# Patient Record
Sex: Female | Born: 1995 | Race: Black or African American | Hispanic: Yes | Marital: Single | Smoking: Former smoker
Health system: Southern US, Community
[De-identification: ages and names within clinical notes are randomized; demographics above are authoritative.]

## PROBLEM LIST (undated history)

## (undated) ENCOUNTER — Inpatient Hospital Stay (HOSPITAL_COMMUNITY): Payer: Self-pay

## (undated) DIAGNOSIS — O2300 Infections of kidney in pregnancy, unspecified trimester: Secondary | ICD-10-CM

## (undated) DIAGNOSIS — R011 Cardiac murmur, unspecified: Secondary | ICD-10-CM

## (undated) DIAGNOSIS — D573 Sickle-cell trait: Secondary | ICD-10-CM

## (undated) DIAGNOSIS — D649 Anemia, unspecified: Secondary | ICD-10-CM

## (undated) DIAGNOSIS — F419 Anxiety disorder, unspecified: Secondary | ICD-10-CM

## (undated) HISTORY — PX: TUBAL LIGATION: SHX77

## (undated) HISTORY — DX: Sickle-cell trait: D57.3

---

## 2015-05-26 ENCOUNTER — Emergency Department (HOSPITAL_COMMUNITY): Payer: Medicaid - Out of State

## 2015-05-26 ENCOUNTER — Emergency Department (HOSPITAL_COMMUNITY)
Admission: EM | Admit: 2015-05-26 | Discharge: 2015-05-27 | Disposition: A | Discharge status: Home / Self Care | Payer: Medicaid - Out of State | Attending: Emergency Medicine | Admitting: Emergency Medicine

## 2015-05-26 DIAGNOSIS — R110 Nausea: Secondary | ICD-10-CM | POA: Insufficient documentation

## 2015-05-26 DIAGNOSIS — R1031 Right lower quadrant pain: Secondary | ICD-10-CM | POA: Diagnosis not present

## 2015-05-26 DIAGNOSIS — R1011 Right upper quadrant pain: Secondary | ICD-10-CM | POA: Diagnosis not present

## 2015-05-26 DIAGNOSIS — R079 Chest pain, unspecified: Secondary | ICD-10-CM | POA: Insufficient documentation

## 2015-05-26 DIAGNOSIS — Z3A01 Less than 8 weeks gestation of pregnancy: Secondary | ICD-10-CM | POA: Insufficient documentation

## 2015-05-26 DIAGNOSIS — O9989 Other specified diseases and conditions complicating pregnancy, childbirth and the puerperium: Secondary | ICD-10-CM | POA: Diagnosis not present

## 2015-05-26 DIAGNOSIS — N898 Other specified noninflammatory disorders of vagina: Secondary | ICD-10-CM | POA: Insufficient documentation

## 2015-05-26 DIAGNOSIS — R1030 Lower abdominal pain, unspecified: Secondary | ICD-10-CM | POA: Diagnosis not present

## 2015-05-26 DIAGNOSIS — Z3490 Encounter for supervision of normal pregnancy, unspecified, unspecified trimester: Secondary | ICD-10-CM

## 2015-05-26 DIAGNOSIS — R1012 Left upper quadrant pain: Secondary | ICD-10-CM | POA: Insufficient documentation

## 2015-05-26 LAB — CBC
HCT: 41.2 % (ref 36.0–46.0)
Hemoglobin: 14.3 g/dL (ref 12.0–15.0)
MCH: 29.6 pg (ref 26.0–34.0)
MCHC: 34.7 g/dL (ref 30.0–36.0)
MCV: 85.3 fL (ref 78.0–100.0)
PLATELETS: 202 10*3/uL (ref 150–400)
RBC: 4.83 MIL/uL (ref 3.87–5.11)
RDW: 14.3 % (ref 11.5–15.5)
WBC: 10.4 10*3/uL (ref 4.0–10.5)

## 2015-05-26 LAB — COMPREHENSIVE METABOLIC PANEL
ALBUMIN: 4.1 g/dL (ref 3.5–5.0)
ALT: 12 U/L — AB (ref 14–54)
AST: 21 U/L (ref 15–41)
Alkaline Phosphatase: 90 U/L (ref 38–126)
Anion gap: 8 (ref 5–15)
CHLORIDE: 105 mmol/L (ref 101–111)
CO2: 24 mmol/L (ref 22–32)
CREATININE: 0.9 mg/dL (ref 0.44–1.00)
Calcium: 9.5 mg/dL (ref 8.9–10.3)
GFR calc Af Amer: >60 mL/min (ref >60)
GFR calc non Af Amer: >60 mL/min (ref >60)
GLUCOSE: 86 mg/dL (ref 65–99)
Potassium: 3.4 mmol/L — ABNORMAL LOW (ref 3.5–5.1)
SODIUM: 137 mmol/L (ref 135–145)
Total Bilirubin: 0.8 mg/dL (ref 0.3–1.2)
Total Protein: 7.2 g/dL (ref 6.5–8.1)

## 2015-05-26 LAB — I-STAT TROPONIN, ED: TROPONIN I, POC: 0 ng/mL (ref 0.00–0.08)

## 2015-05-26 LAB — URINALYSIS, ROUTINE W REFLEX MICROSCOPIC
Bilirubin Urine: NEGATIVE
GLUCOSE, UA: NEGATIVE mg/dL
Hgb urine dipstick: NEGATIVE
KETONES UR: NEGATIVE mg/dL
LEUKOCYTES UA: NEGATIVE
Nitrite: NEGATIVE
PH: 7 (ref 5.0–8.0)
Protein, ur: NEGATIVE mg/dL
Specific Gravity, Urine: 1.013 (ref 1.005–1.030)
Urobilinogen, UA: 1 mg/dL (ref 0.0–1.0)

## 2015-05-26 LAB — I-STAT BETA HCG BLOOD, ED (MC, WL, AP ONLY): I-stat hCG, quantitative: 1457 m[IU]/mL — ABNORMAL HIGH (ref <5)

## 2015-05-26 LAB — LIPASE, BLOOD: LIPASE: 23 U/L (ref 22–51)

## 2015-05-26 NOTE — ED Provider Notes (Signed)
CSN: [id number]     Arrival date & time 05/26/15  1947 History   First MD Initiated Contact with Patient 05/26/15 2313     Chief Complaint  Patient presents with  . Chest Pain  . Abdominal Pain     (Consider location/radiation/quality/duration/timing/severity/associated sxs/prior Treatment) HPI  Shelby Serrano is a 19 yo G2P1 female who presents today with abdominal pain, chest pain that radiates to left arm, and back pain. Patient states that her first pregnancy she was termed "high risk pregnancy" and she delivered her baby early. Patient states that for the last 2 weeks she has been experiencing episodes that start in her upper stomach, travel to her chest, down her left arm, and then to her back and head. She has not taken anything to help with the pain. She cannot identify a certain trigger for these episodes, however she said they could be anxiety related. She describes her abdominal pain as sharpe in nature and is located in the RUQ and LUQ. Patient states that she is pregnant. She has associated nausea and has vomited. She admits to white vaginal discharge that she first noticed this morning. She denies fever, night sweats, lightheadedness.   No past medical history on file. No past surgical history on file. No family history on file. Social History  Substance Use Topics  . Smoking status: Not on file  . Smokeless tobacco: Not on file  . Alcohol Use: Not on file   OB History    No data available     Review of Systems  All other systems negative except as documented in the HPI. All pertinent positives and negatives as reviewed in the HPI.  Allergies  Review of patient's allergies indicates no known allergies.  Home Medications   Prior to Admission medications   Not on File   BP 117/65 mmHg  Pulse 64  Temp(Src) 98.9 F (37.2 C) (Oral)  Resp 12  Ht 5\' 1"  (1.549 m)  Wt 118 lb 9.6 oz (53.797 kg)  BMI 22.42 kg/m2  SpO2 100%  LMP 04/25/2014 (Exact Date) Physical Exam   Constitutional: She is oriented to person, place, and time. She appears well-developed and well-nourished. No distress.  HENT:  Head: Normocephalic and atraumatic.  Eyes: Pupils are equal, round, and reactive to light.  Neck: Normal range of motion. Neck supple.  Cardiovascular: Normal rate, regular rhythm and normal heart sounds.  Exam reveals no gallop and no friction rub.   No murmur heard. Pulmonary/Chest: Effort normal and breath sounds normal. No respiratory distress. She has no wheezes. She has no rales.  Abdominal: Soft. Bowel sounds are normal. She exhibits no distension. There is tenderness in the right upper quadrant, right lower quadrant, suprapubic area and left upper quadrant. There is no rebound and no guarding.  Neurological: She is alert and oriented to person, place, and time. She exhibits normal muscle tone. Coordination normal.  Skin: Skin is warm and dry. No erythema.  Psychiatric: She has a normal mood and affect. Her behavior is normal.  Nursing note and vitals reviewed.   ED Course  Procedures (including critical care time) Labs Review Labs Reviewed  COMPREHENSIVE METABOLIC PANEL - Abnormal; Notable for the following:    Potassium 3.4 (*)    BUN <5 (*)    ALT 12 (*)    All other components within normal limits  I-STAT BETA HCG BLOOD, ED ([facility], [facility], [facility] ONLY) - Abnormal; Notable for the following:    I-stat hCG, quantitative 1457.0 (*)  All other components within normal limits  LIPASE, BLOOD  CBC  URINALYSIS, ROUTINE W REFLEX MICROSCOPIC (NOT AT [facility])  Rosezena Sensor, ED    Imaging Review Dg Chest 2 View  05/26/2015   CLINICAL DATA:  Chest pain radiating to left arm with left arm stiffness.  EXAM: CHEST  2 VIEW  COMPARISON:  None.  FINDINGS: Lungs are clear. Cardiomediastinal silhouette, bones and soft tissues are within normal.  IMPRESSION: No active cardiopulmonary disease.   Electronically Signed   By: Elberta Fortis M.D.   On: 05/26/2015 21:17   I  have personally reviewed and evaluated these images and lab results as part of my medical decision-making.  I explained to the patient that this could be an early pregnancy versus ectopic versus early miscarriage.  The patient understands that she does need to go to [facility] hospital in 2 days for recheck.  Patient has not had any vaginal bleeding or discharge    Charlestine Night, PA-C 05/27/15 1610  Vanetta Mulders, MD 05/28/15 [phone]

## 2015-05-26 NOTE — ED Notes (Addendum)
Patient transported to Ultrasound 

## 2015-05-26 NOTE — ED Notes (Signed)
Patient here with complaint of chest pain and right lateral abdominal pain. States onset during the last week. States comes and goes for a day or so, but remains constant through that day. Believes she is 5-[redacted] weeks pregnant, but has not seen doctor to confirm. LMP : 04/26/2015. Endorses nausea and vomiting in the mornings.

## 2015-05-27 NOTE — Discharge Instructions (Signed)
Go to the [facility] hospital clinic or [facility] in 2 days for a recheck.  Return here for any worsening in your condition

## 2015-06-02 ENCOUNTER — Encounter (HOSPITAL_COMMUNITY): Payer: Self-pay | Admitting: Emergency Medicine

## 2015-06-02 ENCOUNTER — Emergency Department (HOSPITAL_COMMUNITY)
Admission: EM | Admit: 2015-06-02 | Discharge: 2015-06-02 | Disposition: A | Discharge status: Home / Self Care | Payer: Medicaid - Out of State | Attending: Emergency Medicine | Admitting: Emergency Medicine

## 2015-06-02 ENCOUNTER — Emergency Department (HOSPITAL_COMMUNITY): Payer: Medicaid - Out of State

## 2015-06-02 DIAGNOSIS — R109 Unspecified abdominal pain: Secondary | ICD-10-CM

## 2015-06-02 DIAGNOSIS — Z3A01 Less than 8 weeks gestation of pregnancy: Secondary | ICD-10-CM | POA: Diagnosis not present

## 2015-06-02 DIAGNOSIS — Z79899 Other long term (current) drug therapy: Secondary | ICD-10-CM | POA: Insufficient documentation

## 2015-06-02 DIAGNOSIS — O99331 Smoking (tobacco) complicating pregnancy, first trimester: Secondary | ICD-10-CM | POA: Diagnosis not present

## 2015-06-02 DIAGNOSIS — Z3490 Encounter for supervision of normal pregnancy, unspecified, unspecified trimester: Secondary | ICD-10-CM

## 2015-06-02 DIAGNOSIS — O9989 Other specified diseases and conditions complicating pregnancy, childbirth and the puerperium: Secondary | ICD-10-CM | POA: Insufficient documentation

## 2015-06-02 DIAGNOSIS — R1031 Right lower quadrant pain: Secondary | ICD-10-CM

## 2015-06-02 DIAGNOSIS — F17210 Nicotine dependence, cigarettes, uncomplicated: Secondary | ICD-10-CM | POA: Insufficient documentation

## 2015-06-02 LAB — URINALYSIS, ROUTINE W REFLEX MICROSCOPIC
Bilirubin Urine: NEGATIVE
GLUCOSE, UA: NEGATIVE mg/dL
Hgb urine dipstick: NEGATIVE
Ketones, ur: NEGATIVE mg/dL
LEUKOCYTES UA: NEGATIVE
NITRITE: NEGATIVE
PH: 6 (ref 5.0–8.0)
Protein, ur: NEGATIVE mg/dL
SPECIFIC GRAVITY, URINE: 1.013 (ref 1.005–1.030)
Urobilinogen, UA: 0.2 mg/dL (ref 0.0–1.0)

## 2015-06-02 LAB — COMPREHENSIVE METABOLIC PANEL
ALT: 10 U/L — AB (ref 14–54)
ANION GAP: 9 (ref 5–15)
AST: 20 U/L (ref 15–41)
Albumin: 3.8 g/dL (ref 3.5–5.0)
Alkaline Phosphatase: 100 U/L (ref 38–126)
BUN: 11 mg/dL (ref 6–20)
CHLORIDE: 104 mmol/L (ref 101–111)
CO2: 22 mmol/L (ref 22–32)
Calcium: 9.4 mg/dL (ref 8.9–10.3)
Creatinine, Ser: 0.99 mg/dL (ref 0.44–1.00)
GFR calc non Af Amer: >60 mL/min (ref >60)
Glucose, Bld: 85 mg/dL (ref 65–99)
POTASSIUM: 3.9 mmol/L (ref 3.5–5.1)
SODIUM: 135 mmol/L (ref 135–145)
Total Bilirubin: 0.7 mg/dL (ref 0.3–1.2)
Total Protein: 6.4 g/dL — ABNORMAL LOW (ref 6.5–8.1)

## 2015-06-02 LAB — CBC
HEMATOCRIT: 37.4 % (ref 36.0–46.0)
HEMOGLOBIN: 13.1 g/dL (ref 12.0–15.0)
MCH: 29.9 pg (ref 26.0–34.0)
MCHC: 35 g/dL (ref 30.0–36.0)
MCV: 85.4 fL (ref 78.0–100.0)
Platelets: 57 10*3/uL — ABNORMAL LOW (ref 150–400)
RBC: 4.38 MIL/uL (ref 3.87–5.11)
RDW: 14.5 % (ref 11.5–15.5)
WBC: 9.8 10*3/uL (ref 4.0–10.5)

## 2015-06-02 LAB — HCG, QUANTITATIVE, PREGNANCY: hCG, Beta Chain, Quant, S: 7989 m[IU]/mL — ABNORMAL HIGH (ref <5)

## 2015-06-02 LAB — LIPASE, BLOOD: LIPASE: 27 U/L (ref 22–51)

## 2015-06-02 MED ORDER — ONDANSETRON 8 MG PO TBDP: 8.0000 mg | ORAL_TABLET | Freq: Three times a day (TID) | ORAL | Status: DC | PRN

## 2015-06-02 MED ORDER — ACETAMINOPHEN 500 MG PO TABS: 500.0000 mg | ORAL_TABLET | Freq: Four times a day (QID) | ORAL | Status: DC | PRN

## 2015-06-02 MED ORDER — MORPHINE SULFATE (PF) 4 MG/ML IV SOLN: 4.0000 mg | Freq: Once | INTRAVENOUS | Status: DC

## 2015-06-02 MED ORDER — SODIUM CHLORIDE 0.9 % IV BOLUS (SEPSIS): 1000.0000 mL | Freq: Once | INTRAVENOUS | Status: DC

## 2015-06-02 NOTE — Discharge Instructions (Signed)
Abdominal Pain During Pregnancy °Belly (abdominal) pain is common during pregnancy. Most of the time, it is not a serious problem. Other times, it can be a sign that something is wrong with the pregnancy. Always tell your doctor if you have belly pain. °HOME CARE °Monitor your belly pain for any changes. The following actions may help you feel better: °· Do not have sex (intercourse) or put anything in your vagina until you feel better. °· Rest until your pain stops. °· Drink clear fluids if you feel sick to your stomach (nauseous). Do not eat solid food until you feel better. °· Only take medicine as told by your doctor. °· Keep all doctor visits as told. °GET HELP RIGHT AWAY IF:  °· You are bleeding, leaking fluid, or pieces of tissue come out of your vagina. °· You have more pain or cramping. °· You keep throwing up (vomiting). °· You have pain when you pee (urinate) or have blood in your pee. °· You have a fever. °· You do not feel your baby moving as much. °· You feel very weak or feel like passing out. °· You have trouble breathing, with or without belly pain. °· You have a very bad headache and belly pain. °· You have fluid leaking from your vagina and belly pain. °· You keep having watery poop (diarrhea). °· Your belly pain does not go away after resting, or the pain gets worse. °MAKE SURE YOU:  °· Understand these instructions. °· Will watch your condition. °· Will get help right away if you are not doing well or get worse. °Document Released: 08/18/2009 Document Revised: 05/02/2013 Document Reviewed: 03/29/2013 °Exit[employer]® Patient Information ©2015 [employer]. This information is not intended to replace advice given to you by your health care provider. Make sure you discuss any questions you have with your health care provider. ° °

## 2015-06-02 NOTE — ED Notes (Signed)
Patient here with complaint of left lateral abdominal pain. Currently pregnant; LMP was 04/27/2015. Endorses nausea. Denies vomiting, diarrhea, or urinary symptoms.

## 2015-06-02 NOTE — ED Notes (Signed)
Attempted IV access x1, pt jerked away in pain, unable to gain access.

## 2015-06-02 NOTE — ED Provider Notes (Signed)
CSN: [id number]     Arrival date & time 06/02/15  0404 History   First MD Initiated Contact with Patient 06/02/15 0502     Chief Complaint  Patient presents with  . Abdominal Pain      HPI Patient presents complaining of left-sided abdominal discomfort and pain.  She reports that she is pregnant.  Her last normal menstrual.  Was April 27, 2015 she denies vaginal bleeding.  She denies vaginal discharge.  No nausea vomiting or diarrhea.  She denies dysuria or urinary frequency.  No chest pain shortness of breath.  Denies fevers and chills.    History reviewed. No pertinent past medical history. History reviewed. No pertinent past surgical history. No family history on file. Social History  Substance Use Topics  . Smoking status: Current Some Day Smoker  . Smokeless tobacco: None  . Alcohol Use: No   OB History    Gravida Para Term Preterm AB TAB SAB Ectopic Multiple Living   1              Review of Systems  All other systems reviewed and are negative.     Allergies  Review of patient's allergies indicates no known allergies.  Home Medications   Prior to Admission medications   Medication Sig Start Date End Date Taking? Authorizing Ary Rudnick  Prenatal Vit-Fe Fumarate-FA (PRENATAL MULTIVITAMIN) TABS tablet Take 1 tablet by mouth daily at 12 noon.   Yes Historical Lillie Portner, MD  acetaminophen (TYLENOL) 500 MG tablet Take 1 tablet (500 mg total) by mouth every 6 (six) hours as needed. 06/02/15   Azalia Bilis, MD   BP 107/60 mmHg  Pulse 62  Temp(Src) 98.4 F (36.9 C) (Oral)  Resp 14  Ht  (1.549 m)  Wt 118 lb (53.524 kg)  BMI 22.31 kg/m2  SpO2 100%  LMP 04/27/2015 (Exact Date) Physical Exam  Constitutional: She is oriented to person, place, and time. She appears well-developed and well-nourished. No distress.  HENT:  Head: Normocephalic and atraumatic.  Eyes: EOM are normal.  Neck: Normal range of motion.  Cardiovascular: Normal rate, regular rhythm and normal  heart sounds.   Pulmonary/Chest: Effort normal and breath sounds normal.  Abdominal: Soft. She exhibits no distension.  Mild left-sided abdominal tenderness.  Musculoskeletal: Normal range of motion.  Neurological: She is alert and oriented to person, place, and time.  Skin: Skin is warm and dry.  Psychiatric: She has a normal mood and affect. Judgment normal.  Nursing note and vitals reviewed.   ED Course  Procedures (including critical care time) Labs Review Labs Reviewed  COMPREHENSIVE METABOLIC PANEL - Abnormal; Notable for the following:    Total Protein 6.4 (*)    ALT 10 (*)    All other components within normal limits  CBC - Abnormal; Notable for the following:    Platelets 57 (*)    All other components within normal limits  HCG, QUANTITATIVE, PREGNANCY - Abnormal; Notable for the following:    hCG, Beta Chain, Quant, S 7989 (*)    All other components within normal limits  URINALYSIS, ROUTINE W REFLEX MICROSCOPIC (NOT AT [facility])  LIPASE, BLOOD    Imaging Review US Ob Comp Less 14 Wks  06/02/2015   CLINICAL DATA:  Lower abdominal pain during pregnancy. Estimated gestational age by LMP is 5 weeks 4 days. Quantitative beta HCG is not provided.  EXAM: OBSTETRIC <14 WK [place] AND TRANSVAGINAL OB US  TECHNIQUE: Both transabdominal and transvaginal ultrasound examinations were performed for  complete evaluation of the gestation as well as the maternal uterus, adnexal regions, and pelvic cul-de-sac. Transvaginal technique was performed to assess early pregnancy.  COMPARISON:  05/26/2015  FINDINGS: Intrauterine gestational sac: A single intrauterine gestational sac is visualized.  Yolk sac:  Yolk sac is present.  Embryo:  Fetal pole is not identified.  Cardiac Activity: Not identified.  MSD: 7.5  mm   5 w   4  d  Maternal uterus/adnexae: Uterus is anteverted. No focal myometrial masses. No subchorionic hemorrhage. Both ovaries are visualized and appear normal. Involuting corpus luteal cysts  is suggested on the right. No free pelvic fluid collections.  IMPRESSION: Probable early intrauterine gestational sac with yolk sac, but no fetal pole or cardiac activity yet visualized. Recommend follow-up quantitative B-HCG levels and follow-up US in 14 days to confirm and assess viability. This recommendation follows SRU consensus guidelines: Diagnostic Criteria for Nonviable Pregnancy Early in the First Trimester. Malva Limes Med 2013; 161:0960-45.   Electronically Signed   By: Burman Nieves M.D.   On: 06/02/2015 06:48   US Ob Transvaginal  06/02/2015   CLINICAL DATA:  Lower abdominal pain during pregnancy. Estimated gestational age by LMP is 5 weeks 4 days. Quantitative beta HCG is not provided.  EXAM: OBSTETRIC <14 WK [place] AND TRANSVAGINAL OB US  TECHNIQUE: Both transabdominal and transvaginal ultrasound examinations were performed for complete evaluation of the gestation as well as the maternal uterus, adnexal regions, and pelvic cul-de-sac. Transvaginal technique was performed to assess early pregnancy.  COMPARISON:  05/26/2015  FINDINGS: Intrauterine gestational sac: A single intrauterine gestational sac is visualized.  Yolk sac:  Yolk sac is present.  Embryo:  Fetal pole is not identified.  Cardiac Activity: Not identified.  MSD: 7.5  mm   5 w   4  d  Maternal uterus/adnexae: Uterus is anteverted. No focal myometrial masses. No subchorionic hemorrhage. Both ovaries are visualized and appear normal. Involuting corpus luteal cysts is suggested on the right. No free pelvic fluid collections.  IMPRESSION: Probable early intrauterine gestational sac with yolk sac, but no fetal pole or cardiac activity yet visualized. Recommend follow-up quantitative B-HCG levels and follow-up US in 14 days to confirm and assess viability. This recommendation follows SRU consensus guidelines: Diagnostic Criteria for Nonviable Pregnancy Early in the First Trimester. Malva Limes Med 2013; 409:8119-14.   Electronically Signed   By:  Burman Nieves M.D.   On: 06/02/2015 06:48   US Abdomen Limited  06/02/2015   CLINICAL DATA:  Pregnant with right upper quadrant pain since 9 p.m. last night.  EXAM: US ABDOMEN LIMITED - RIGHT UPPER QUADRANT  COMPARISON:  None.  FINDINGS: Gallbladder:  Gallbladder is contracted. This may be physiologic although the patient indicates that she has been fasting for greater than 6 hours. Contraction due to inflammatory causes or gallbladder dysfunction not excluded. Due to gallbladder contraction, evaluation for stones or sludge is limited. Murphy's sign is negative. Gallbladder wall is not thickened.  Common bile duct:  Diameter: 3.2 mm, normal  Liver:  No focal lesion identified. Within normal limits in parenchymal echogenicity.  IMPRESSION: Nonspecific finding of contracted gallbladder. This may be physiologic. Inflammatory causes or gallbladder dysfunction not excluded. Liver and bile ducts are unremarkable.   Electronically Signed   By: Burman Nieves M.D.   On: 06/02/2015 06:42   I have personally reviewed and evaluated these images and lab results as part of my medical decision-making.   EKG Interpretation None  MDM   Final diagnoses:  Abdominal pain, unspecified abdominal location  Pregnancy    No right upper quadrant tenderness on examination on repeat evaluation.  Patient will need follow-up with OB.  She will need repeat beta hCG in 4872 hours.  I recommend that she follow-up at [facility].    Azalia Bilis, MD 06/03/15 [phone]

## 2015-09-14 NOTE — L&D Delivery Note (Signed)
Delivery Note At 7:44 PM a viable female was delivered via Vaginal, Spontaneous Delivery (Presentation:ROA ;  ).  APGAR: 8, 9; weight  .   Placenta status: delivered intact with gentle traction.  Cord:3VC   Anesthesia:  Epidural Episiotomy:   Lacerations:  1st degree  Est. Blood Loss (mL):  150  Mom to postpartum.  Baby to couplet care  Charlesetta GaribaldiKathryn Lorraine Addyson Traub CNM 07/15/2016, 8:10 PM

## 2015-11-01 ENCOUNTER — Emergency Department (HOSPITAL_COMMUNITY): Payer: Medicaid - Out of State

## 2015-11-01 ENCOUNTER — Emergency Department (HOSPITAL_COMMUNITY)
Admission: EM | Admit: 2015-11-01 | Discharge: 2015-11-01 | Disposition: A | Discharge status: Home / Self Care | Payer: Medicaid - Out of State | Attending: Emergency Medicine | Admitting: Emergency Medicine

## 2015-11-01 ENCOUNTER — Encounter (HOSPITAL_COMMUNITY): Payer: Self-pay

## 2015-11-01 DIAGNOSIS — F17200 Nicotine dependence, unspecified, uncomplicated: Secondary | ICD-10-CM | POA: Diagnosis not present

## 2015-11-01 DIAGNOSIS — R079 Chest pain, unspecified: Secondary | ICD-10-CM | POA: Insufficient documentation

## 2015-11-01 DIAGNOSIS — R1013 Epigastric pain: Secondary | ICD-10-CM | POA: Insufficient documentation

## 2015-11-01 DIAGNOSIS — Z79899 Other long term (current) drug therapy: Secondary | ICD-10-CM | POA: Diagnosis not present

## 2015-11-01 DIAGNOSIS — R1012 Left upper quadrant pain: Secondary | ICD-10-CM | POA: Diagnosis not present

## 2015-11-01 DIAGNOSIS — R0602 Shortness of breath: Secondary | ICD-10-CM | POA: Diagnosis not present

## 2015-11-01 DIAGNOSIS — Z3202 Encounter for pregnancy test, result negative: Secondary | ICD-10-CM | POA: Insufficient documentation

## 2015-11-01 LAB — CBC
HCT: 43.3 % (ref 36.0–46.0)
Hemoglobin: 14.9 g/dL (ref 12.0–15.0)
MCH: 29 pg (ref 26.0–34.0)
MCHC: 34.4 g/dL (ref 30.0–36.0)
MCV: 84.4 fL (ref 78.0–100.0)
Platelets: 193 10*3/uL (ref 150–400)
RBC: 5.13 MIL/uL — ABNORMAL HIGH (ref 3.87–5.11)
RDW: 14.8 % (ref 11.5–15.5)
WBC: 7.7 10*3/uL (ref 4.0–10.5)

## 2015-11-01 LAB — BASIC METABOLIC PANEL
Anion gap: 10 (ref 5–15)
BUN: 11 mg/dL (ref 6–20)
CALCIUM: 9.8 mg/dL (ref 8.9–10.3)
CO2: 23 mmol/L (ref 22–32)
Chloride: 105 mmol/L (ref 101–111)
Creatinine, Ser: 0.87 mg/dL (ref 0.44–1.00)
GFR calc Af Amer: >60 mL/min (ref >60)
GLUCOSE: 86 mg/dL (ref 65–99)
Potassium: 3.7 mmol/L (ref 3.5–5.1)
Sodium: 138 mmol/L (ref 135–145)

## 2015-11-01 LAB — HEPATIC FUNCTION PANEL
ALBUMIN: 4.3 g/dL (ref 3.5–5.0)
ALK PHOS: 90 U/L (ref 38–126)
ALT: 15 U/L (ref 14–54)
AST: 23 U/L (ref 15–41)
BILIRUBIN TOTAL: 0.8 mg/dL (ref 0.3–1.2)
Bilirubin, Direct: <0.1 mg/dL — ABNORMAL LOW (ref 0.1–0.5)
Total Protein: 7.3 g/dL (ref 6.5–8.1)

## 2015-11-01 LAB — I-STAT TROPONIN, ED: TROPONIN I, POC: 0 ng/mL (ref 0.00–0.08)

## 2015-11-01 LAB — D-DIMER, QUANTITATIVE: D-Dimer, Quant: <0.27 ug/mL-FEU (ref 0.00–0.50)

## 2015-11-01 LAB — POC URINE PREG, ED: Preg Test, Ur: NEGATIVE

## 2015-11-01 LAB — LIPASE, BLOOD: LIPASE: 26 U/L (ref 11–51)

## 2015-11-01 MED ORDER — SUCRALFATE 1 GM/10ML PO SUSP: 1.0000 g | Freq: Three times a day (TID) | ORAL | Status: DC

## 2015-11-01 MED ORDER — OMEPRAZOLE 20 MG PO CPDR: 20.0000 mg | DELAYED_RELEASE_CAPSULE | Freq: Every day | ORAL | Status: DC

## 2015-11-01 MED ORDER — GI COCKTAIL ~~LOC~~
30.0000 mL | Freq: Once | ORAL | Status: AC
  Administered 2015-11-01: 30 mL via ORAL
  Filled 2015-11-01: qty 30

## 2015-11-01 MED ORDER — ACETAMINOPHEN 325 MG PO TABS
650.0000 mg | ORAL_TABLET | Freq: Once | ORAL | Status: AC
  Administered 2015-11-01: 650 mg via ORAL
  Filled 2015-11-01: qty 2

## 2015-11-01 NOTE — ED Provider Notes (Signed)
CSN: [id number]     Arrival date & time 11/01/15  1700 History   First MD Initiated Contact with Patient 11/01/15 1743     Chief Complaint  Patient presents with  . Chest Pain     (Consider location/radiation/quality/duration/timing/severity/associated sxs/prior Treatment) HPI Comments: Patient presents to emergency department with chief complaint of chest pain and left upper quadrant abdominal pain. Patient states the symptoms started about 30 minutes ago. She reports similar episodes past. She denies any burning sensation. She states she feels a sharp. She is afraid that she is going to pass out. She reports feeling out of breath. She denies any history of PE, DVT, or ACLS. She does not take any exogenous estrogen, but has had a car ride greater than 6 hours in the past 2 weeks. She denies any calf tenderness. She denies any other associated symptoms.  The history is provided by the patient. No language interpreter was used.    History reviewed. No pertinent past medical history. History reviewed. No pertinent past surgical history. No family history on file. Social History  Substance Use Topics  . Smoking status: Current Some Day Smoker  . Smokeless tobacco: None  . Alcohol Use: No   OB History    Gravida Para Term Preterm AB TAB SAB Ectopic Multiple Living   1              Review of Systems  Constitutional: Negative for fever and chills.  Respiratory: Positive for shortness of breath.   Cardiovascular: Positive for chest pain.  Gastrointestinal: Negative for nausea, vomiting, diarrhea and constipation.  Genitourinary: Negative for dysuria.  All other systems reviewed and are negative.     Allergies  Review of patient's allergies indicates no known allergies.  Home Medications   Prior to Admission medications   Medication Sig Start Date End Date Taking? Authorizing Provider  acetaminophen (TYLENOL) 500 MG tablet Take 1 tablet (500 mg total) by mouth every 6 (six)  hours as needed. 06/02/15  Yes Azalia Bilis, MD  divalproex (DEPAKOTE) 250 MG DR tablet Take 250-500 mg by mouth 2 (two) times daily. Take 250mg  in the am and 500mg  at night   Yes Historical Provider, MD  ibuprofen (ADVIL,MOTRIN) 200 MG tablet Take 200-400 mg by mouth every 6 (six) hours as needed for moderate pain.   Yes Historical Provider, MD  risperiDONE (RISPERDAL) 1 MG tablet Take 1 mg by mouth daily.    Yes Historical Provider, MD  ondansetron (ZOFRAN ODT) 8 MG disintegrating tablet Take 1 tablet (8 mg total) by mouth every 8 (eight) hours as needed for nausea or vomiting. Patient not taking: Reported on 11/01/2015 06/02/15   Azalia Bilis, MD   BP 116/82 mmHg  Pulse 91  Temp(Src) 97.1 F (36.2 C) (Oral)  Resp 24  SpO2 100%  LMP 09/21/2015 (Approximate)  Breastfeeding? Unknown Physical Exam  Constitutional: She is oriented to person, place, and time. She appears well-developed and well-nourished.  HENT:  Head: Normocephalic and atraumatic.  Eyes: Conjunctivae and EOM are normal. Pupils are equal, round, and reactive to light.  Neck: Normal range of motion. Neck supple.  Cardiovascular: Normal rate and regular rhythm.  Exam reveals no gallop and no friction rub.   No murmur heard. Pulmonary/Chest: Effort normal and breath sounds normal. No respiratory distress. She has no wheezes. She has no rales. She exhibits no tenderness.  Abdominal: Soft. Bowel sounds are normal. She exhibits no distension and no mass. There is no tenderness. There is no  rebound and no guarding.  Mild to moderate epigastric abdominal tenderness, no other focal abdominal tenderness  Musculoskeletal: Normal range of motion. She exhibits no edema or tenderness.  Neurological: She is alert and oriented to person, place, and time.  Skin: Skin is warm and dry.  Psychiatric: She has a normal mood and affect. Her behavior is normal. Judgment and thought content normal.  Nursing note and vitals reviewed.   ED Course   Procedures (including critical care time) Results for orders placed or performed during the hospital encounter of 11/01/15  Basic metabolic panel  Result Value Ref Range   Sodium 138 135 - 145 mmol/L   Potassium 3.7 3.5 - 5.1 mmol/L   Chloride 105 101 - 111 mmol/L   CO2 23 22 - 32 mmol/L   Glucose, Bld 86 65 - 99 mg/dL   BUN 11 6 - 20 mg/dL   Creatinine, Ser 1.61 0.44 - 1.00 mg/dL   Calcium 9.8 8.9 - 09.6 mg/dL   GFR calc non Af Amer >60 >60 mL/min   GFR calc Af Amer >60 >60 mL/min   Anion gap 10 5 - 15  CBC  Result Value Ref Range   WBC 7.7 4.0 - 10.5 K/uL   RBC 5.13 (H) 3.87 - 5.11 MIL/uL   Hemoglobin 14.9 12.0 - 15.0 g/dL   HCT 04.5 40.9 - 81.1 %   MCV 84.4 78.0 - 100.0 fL   MCH 29.0 26.0 - 34.0 pg   MCHC 34.4 30.0 - 36.0 g/dL   RDW 91.4 78.2 - 95.6 %   Platelets 193 150 - 400 K/uL  D-dimer, quantitative (not at [facility])  Result Value Ref Range   D-Dimer, Quant <0.27 0.00 - 0.50 ug/mL-FEU  Hepatic function panel  Result Value Ref Range   Total Protein 7.3 6.5 - 8.1 g/dL   Albumin 4.3 3.5 - 5.0 g/dL   AST 23 15 - 41 U/L   ALT 15 14 - 54 U/L   Alkaline Phosphatase 90 38 - 126 U/L   Total Bilirubin 0.8 0.3 - 1.2 mg/dL   Bilirubin, Direct <2.1 (L) 0.1 - 0.5 mg/dL   Indirect Bilirubin NOT CALCULATED 0.3 - 0.9 mg/dL  Lipase, blood  Result Value Ref Range   Lipase 26 11 - 51 U/L  I-stat troponin, ED (not at [facility])  Result Value Ref Range   Troponin i, poc 0.00 0.00 - 0.08 ng/mL   Comment 3          POC Urine Pregnancy, ED (do NOT order at [facility])  Result Value Ref Range   Preg Test, Ur NEGATIVE NEGATIVE   Dg Chest 2 View  11/01/2015  CLINICAL DATA:  20 year old female with chest pain EXAM: CHEST  2 VIEW COMPARISON:  Radiograph dated 05/26/2015 FINDINGS: The heart size and mediastinal contours are within normal limits. Both lungs are clear. The visualized skeletal structures are unremarkable. IMPRESSION: No active cardiopulmonary disease. Electronically Signed   By:  Elgie Collard M.D.   On: 11/01/2015 18:11    I have personally reviewed and evaluated these images and lab results as part of my medical decision-making.   EKG Interpretation   Date/Time:  Saturday November 01 2015 17:08:47 EST Ventricular Rate:  89 PR Interval:  107 QRS Duration: 75 QT Interval:  325 QTC Calculation: 395 R Axis:   88 Text Interpretation:  Sinus rhythm Short PR interval Baseline wander in  lead(s) III aVL Confirmed by Juleen China  MD, STEPHEN ([id number]) on 11/01/2015  5:39:46  PM      MDM   Final diagnoses:  Chest pain, unspecified chest pain type  Epigastric abdominal pain    Patient with left upper quadrant abdominal pain. She states pain radiates to her chest. States pain started about 30 minutes prior to arrival. She reports similar pain in the past. Vital signs are stable. Patient is young and otherwise healthy. Doubt ACS. Patient does not have a history of PE or DVT. She did have recent long travel, I will order a d-dimer. Remaining labs ordered in triage.  Laboratory workup is reassuring. There is no leukocytosis, doubt any infection. Electrolytes are normal. Liver function tests are normal. Lipase is normal. Doubt gallbladder disease or pancreatitis. Troponin is negative, patient is already extremely low risk for ACS. D-dimer is negative. Doubt PE. Chest x-ray is negative. Patient had some improvement with GI cocktail. I suspect that her symptoms could be GERD related versus peptic ulcer disease. Will recommend primary care follow-up. Discharge with omeprazole and Carafate. Patient understands and agrees with the plan. She is encouraged to follow-up with a primary care doctor in the next week.    Roxy Horseman, PA-C 11/01/15 1944  Lavera Guise, MD 11/02/15 1240

## 2015-11-01 NOTE — Discharge Instructions (Signed)
Nonspecific Chest Pain  °Chest pain can be caused by many different conditions. There is always a chance that your pain could be related to something serious, such as a heart attack or a blood clot in your lungs. Chest pain can also be caused by conditions that are not life-threatening. If you have chest pain, it is very important to follow up with your health care provider. °CAUSES  °Chest pain can be caused by: °· Heartburn. °· Pneumonia or bronchitis. °· Anxiety or stress. °· Inflammation around your heart (pericarditis) or lung (pleuritis or pleurisy). °· A blood clot in your lung. °· A collapsed lung (pneumothorax). It can develop suddenly on its own (spontaneous pneumothorax) or from trauma to the chest. °· Shingles infection (varicella-zoster virus). °· Heart attack. °· Damage to the bones, muscles, and cartilage that make up your chest wall. This can include: °¨ Bruised bones due to injury. °¨ Strained muscles or cartilage due to frequent or repeated coughing or overwork. °¨ Fracture to one or more ribs. °¨ Sore cartilage due to inflammation (costochondritis). °RISK FACTORS  °Risk factors for chest pain may include: °· Activities that increase your risk for trauma or injury to your chest. °· Respiratory infections or conditions that cause frequent coughing. °· Medical conditions or overeating that can cause heartburn. °· Heart disease or family history of heart disease. °· Conditions or health behaviors that increase your risk of developing a blood clot. °· Having had chicken pox (varicella zoster). °SIGNS AND SYMPTOMS °Chest pain can feel like: °· Burning or tingling on the surface of your chest or deep in your chest. °· Crushing, pressure, aching, or squeezing pain. °· Dull or sharp pain that is worse when you move, cough, or take a deep breath. °· Pain that is also felt in your back, neck, shoulder, or arm, or pain that spreads to any of these areas. °Your chest pain may come and go, or it may stay  constant. °DIAGNOSIS °Lab tests or other studies may be needed to find the cause of your pain. Your health care provider may have you take a test called an ambulatory ECG (electrocardiogram). An ECG records your heartbeat patterns at the time the test is performed. You may also have other tests, such as: °· Transthoracic echocardiogram (TTE). During echocardiography, sound waves are used to create a picture of all of the heart structures and to look at how blood flows through your heart. °· Transesophageal echocardiogram (TEE). This is a more advanced imaging test that obtains images from inside your body. It allows your health care provider to see your heart in finer detail. °· Cardiac monitoring. This allows your health care provider to monitor your heart rate and rhythm in real time. °· Holter monitor. This is a portable device that records your heartbeat and can help to diagnose abnormal heartbeats. It allows your health care provider to track your heart activity for several days, if needed. °· Stress tests. These can be done through exercise or by taking medicine that makes your heart beat more quickly. °· Blood tests. °· Imaging tests. °TREATMENT  °Your treatment depends on what is causing your chest pain. Treatment may include: °· Medicines. These may include: °¨ Acid blockers for heartburn. °¨ Anti-inflammatory medicine. °¨ Pain medicine for inflammatory conditions. °¨ Antibiotic medicine, if an infection is present. °¨ Medicines to dissolve blood clots. °¨ Medicines to treat coronary artery disease. °· Supportive care for conditions that do not require medicines. This may include: °¨ Resting. °¨ Applying heat   or cold packs to injured areas.  Limiting activities until pain decreases. HOME CARE INSTRUCTIONS  If you were prescribed an antibiotic medicine, finish it all even if you start to feel better.  Avoid any activities that bring on chest pain.  Do not use any tobacco products, including  cigarettes, chewing tobacco, or electronic cigarettes. If you need help quitting, ask your health care provider.  Do not drink alcohol.  Take medicines only as directed by your health care provider.  Keep all follow-up visits as directed by your health care provider. This is important. This includes any further testing if your chest pain does not go away.  If heartburn is the cause for your chest pain, you may be told to keep your head raised (elevated) while sleeping. This reduces the chance that acid will go from your stomach into your esophagus.  Make lifestyle changes as directed by your health care provider. These may include:  Getting regular exercise. Ask your health care provider to suggest some activities that are safe for you.  Eating a heart-healthy diet. A registered dietitian can help you to learn healthy eating options.  Maintaining a healthy weight.  Managing diabetes, if necessary.  Reducing stress. SEEK MEDICAL CARE IF:  Your chest pain does not go away after treatment.  You have a rash with blisters on your chest.  You have a fever. SEEK IMMEDIATE MEDICAL CARE IF:   Your chest pain is worse.  You have an increasing cough, or you cough up blood.  You have severe abdominal pain.  You have severe weakness.  You faint.  You have chills.  You have sudden, unexplained chest discomfort.  You have sudden, unexplained discomfort in your arms, back, neck, or jaw.  You have shortness of breath at any time.  You suddenly start to sweat, or your skin gets clammy.  You feel nauseous or you vomit.  You suddenly feel light-headed or dizzy.  Your heart begins to beat quickly, or it feels like it is skipping beats. These symptoms may represent a serious problem that is an emergency. Do not wait to see if the symptoms will go away. Get medical help right away. Call your local emergency services (911 in the [place] Do not drive yourself to the hospital.   This  information is not intended to replace advice given to you by your health care provider. Make sure you discuss any questions you have with your health care provider.   Document Released: 06/09/2005 Document Revised: 09/20/2014 Document Reviewed: 04/05/2014 Elsevier Interactive Patient Education 2016 [employer] Peptic Ulcer A peptic ulcer is a sore in the lining of your esophagus (esophageal ulcer), stomach (gastric ulcer), or in the first part of your small intestine (duodenal ulcer). The ulcer causes erosion into the deeper tissue. CAUSES  Normally, the lining of the stomach and the small intestine protects itself from the acid that digests food. The protective lining can be damaged by:  An infection caused by a bacterium called Helicobacter pylori (H. pylori).  Regular use of nonsteroidal anti-inflammatory drugs (NSAIDs), such as ibuprofen or aspirin.  Smoking tobacco. Other risk factors include being older than 50, drinking alcohol excessively, and having a family history of ulcer disease.  SYMPTOMS   Burning pain or gnawing in the area between the chest and the belly button.  Heartburn.  Nausea and vomiting.  Bloating. The pain can be worse on an empty stomach and at night. If the ulcer results in bleeding, it can cause:  Black, tarry stools.  Vomiting of bright red blood.  Vomiting of coffee-ground-looking materials. DIAGNOSIS  A diagnosis is usually made based upon your history and an exam. Other tests and procedures may be performed to find the cause of the ulcer. Finding a cause will help determine the best treatment. Tests and procedures may include:  Blood tests, stool tests, or breath tests to check for the bacterium H. pylori.  An upper gastrointestinal (GI) series of the esophagus, stomach, and small intestine.  An endoscopy to examine the esophagus, stomach, and small intestine.  A biopsy. TREATMENT  Treatment may include:  Eliminating the cause of the  ulcer, such as smoking, NSAIDs, or alcohol.  Medicines to reduce the amount of acid in your digestive tract.  Antibiotic medicines if the ulcer is caused by the H. pylori bacterium.  An upper endoscopy to treat a bleeding ulcer.  Surgery if the bleeding is severe or if the ulcer created a hole somewhere in the digestive system. HOME CARE INSTRUCTIONS   Avoid tobacco, alcohol, and caffeine. Smoking can increase the acid in the stomach, and continued smoking will impair the healing of ulcers.  Avoid foods and drinks that seem to cause discomfort or aggravate your ulcer.  Only take medicines as directed by your caregiver. Do not substitute over-the-counter medicines for prescription medicines without talking to your caregiver.  Keep any follow-up appointments and tests as directed. SEEK MEDICAL CARE IF:   Your do not improve within 7 days of starting treatment.  You have ongoing indigestion or heartburn. SEEK IMMEDIATE MEDICAL CARE IF:   You have sudden, sharp, or persistent abdominal pain.  You have bloody or dark black, tarry stools.  You vomit blood or vomit that looks like coffee grounds.  You become light-headed, weak, or feel faint.  You become sweaty or clammy. MAKE SURE YOU:   Understand these instructions.  Will watch your condition.  Will get help right away if you are not doing well or get worse.   This information is not intended to replace advice given to you by your health care provider. Make sure you discuss any questions you have with your health care provider.   Document Released: 08/27/2000 Document Revised: 09/20/2014 Document Reviewed: 03/29/2012 Elsevier Interactive Patient Education [employer]

## 2015-11-01 NOTE — ED Notes (Signed)
Pt presents with c/o central chest pain that started approx 30 mins ago. Pt reports she has had this pain before. Pt also reports associated shortness of breath and dizziness.

## 2016-01-16 ENCOUNTER — Emergency Department (HOSPITAL_COMMUNITY)
Admission: EM | Admit: 2016-01-16 | Discharge: 2016-01-16 | Discharge status: Against Medical Advice | Payer: Medicaid - Out of State

## 2016-01-16 NOTE — ED Notes (Signed)
Pt called for Triage x 3 w/o answer.

## 2016-01-16 NOTE — ED Notes (Signed)
Pt placed in Triage 2 and witnessed by staff to walk back to lobby.

## 2016-01-16 NOTE — ED Notes (Signed)
Pt called for Triage x 2 w/o answer.

## 2016-01-25 ENCOUNTER — Encounter (HOSPITAL_COMMUNITY): Payer: Self-pay

## 2016-01-25 ENCOUNTER — Inpatient Hospital Stay (HOSPITAL_COMMUNITY)
Admission: AD | Admit: 2016-01-25 | Discharge: 2016-01-25 | Disposition: A | Discharge status: Home / Self Care | Payer: Medicaid - Out of State | Source: Ambulatory Visit | Attending: Obstetrics & Gynecology | Admitting: Obstetrics & Gynecology

## 2016-01-25 ENCOUNTER — Inpatient Hospital Stay (HOSPITAL_COMMUNITY): Payer: Medicaid - Out of State

## 2016-01-25 DIAGNOSIS — O23592 Infection of other part of genital tract in pregnancy, second trimester: Secondary | ICD-10-CM | POA: Diagnosis not present

## 2016-01-25 DIAGNOSIS — B9689 Other specified bacterial agents as the cause of diseases classified elsewhere: Secondary | ICD-10-CM | POA: Diagnosis not present

## 2016-01-25 DIAGNOSIS — O210 Mild hyperemesis gravidarum: Secondary | ICD-10-CM | POA: Insufficient documentation

## 2016-01-25 DIAGNOSIS — O219 Vomiting of pregnancy, unspecified: Secondary | ICD-10-CM

## 2016-01-25 DIAGNOSIS — N760 Acute vaginitis: Secondary | ICD-10-CM | POA: Diagnosis not present

## 2016-01-25 DIAGNOSIS — Z3A16 16 weeks gestation of pregnancy: Secondary | ICD-10-CM

## 2016-01-25 DIAGNOSIS — O26842 Uterine size-date discrepancy, second trimester: Secondary | ICD-10-CM | POA: Insufficient documentation

## 2016-01-25 DIAGNOSIS — Z3A18 18 weeks gestation of pregnancy: Secondary | ICD-10-CM | POA: Insufficient documentation

## 2016-01-25 LAB — OB RESULTS CONSOLE GC/CHLAMYDIA: Gonorrhea: NEGATIVE

## 2016-01-25 LAB — URINALYSIS, ROUTINE W REFLEX MICROSCOPIC
Bilirubin Urine: NEGATIVE
Glucose, UA: NEGATIVE mg/dL
HGB URINE DIPSTICK: NEGATIVE
Ketones, ur: NEGATIVE mg/dL
LEUKOCYTES UA: NEGATIVE
NITRITE: NEGATIVE
PROTEIN: NEGATIVE mg/dL
SPECIFIC GRAVITY, URINE: 1.01 (ref 1.005–1.030)
pH: 6 (ref 5.0–8.0)

## 2016-01-25 LAB — BASIC METABOLIC PANEL
Anion gap: 8 (ref 5–15)
BUN: 12 mg/dL (ref 6–20)
CHLORIDE: 105 mmol/L (ref 101–111)
CO2: 23 mmol/L (ref 22–32)
Calcium: 9.3 mg/dL (ref 8.9–10.3)
Creatinine, Ser: 0.67 mg/dL (ref 0.44–1.00)
GFR calc Af Amer: >60 mL/min (ref >60)
GFR calc non Af Amer: >60 mL/min (ref >60)
GLUCOSE: 74 mg/dL (ref 65–99)
POTASSIUM: 3.7 mmol/L (ref 3.5–5.1)
Sodium: 136 mmol/L (ref 135–145)

## 2016-01-25 LAB — WET PREP, GENITAL
SPERM: NONE SEEN
TRICH WET PREP: NONE SEEN
Yeast Wet Prep HPF POC: NONE SEEN

## 2016-01-25 LAB — CBC
HEMATOCRIT: 34.6 % — AB (ref 36.0–46.0)
Hemoglobin: 12.1 g/dL (ref 12.0–15.0)
MCH: 29.8 pg (ref 26.0–34.0)
MCHC: 35 g/dL (ref 30.0–36.0)
MCV: 85.2 fL (ref 78.0–100.0)
PLATELETS: 168 10*3/uL (ref 150–400)
RBC: 4.06 MIL/uL (ref 3.87–5.11)
RDW: 14.2 % (ref 11.5–15.5)
WBC: 12 10*3/uL — ABNORMAL HIGH (ref 4.0–10.5)

## 2016-01-25 LAB — ABO/RH: ABO/RH(D): O POS

## 2016-01-25 LAB — HEPATITIS B SURFACE ANTIGEN: Hepatitis B Surface Ag: NEGATIVE

## 2016-01-25 MED ORDER — PRENATAL PLUS 27-1 MG PO TABS: 1.0000 | ORAL_TABLET | Freq: Every day | ORAL | Status: DC

## 2016-01-25 MED ORDER — METRONIDAZOLE 500 MG PO TABS: 500.0000 mg | ORAL_TABLET | Freq: Two times a day (BID) | ORAL | Status: DC

## 2016-01-25 MED ORDER — PROMETHAZINE HCL 12.5 MG PO TABS: 12.5000 mg | ORAL_TABLET | Freq: Four times a day (QID) | ORAL | Status: DC | PRN

## 2016-01-25 NOTE — Discharge Instructions (Signed)
Bacterial Vaginosis °Bacterial vaginosis is a vaginal infection that occurs when the normal balance of bacteria in the vagina is disrupted. It results from an overgrowth of certain bacteria. This is the most common vaginal infection in women of childbearing age. Treatment is important to prevent complications, especially in pregnant women, as it can cause a premature delivery. °CAUSES  °Bacterial vaginosis is caused by an increase in harmful bacteria that are normally present in smaller amounts in the vagina. Several different kinds of bacteria can cause bacterial vaginosis. However, the reason that the condition develops is not fully understood. °RISK FACTORS °Certain activities or behaviors can put you at an increased risk of developing bacterial vaginosis, including: °· Having a new sex partner or multiple sex partners. °· Douching. °· Using an intrauterine device (IUD) for contraception. °Women do not get bacterial vaginosis from toilet seats, bedding, swimming pools, or contact with objects around them. °SIGNS AND SYMPTOMS  °Some women with bacterial vaginosis have no signs or symptoms. Common symptoms include: °· Grey vaginal discharge. °· A fishlike odor with discharge, especially after sexual intercourse. °· Itching or burning of the vagina and vulva. °· Burning or pain with urination. °DIAGNOSIS  °Your health care provider will take a medical history and examine the vagina for signs of bacterial vaginosis. A sample of vaginal fluid may be taken. Your health care provider will look at this sample under a microscope to check for bacteria and abnormal cells. A vaginal pH test may also be done.  °TREATMENT  °Bacterial vaginosis may be treated with antibiotic medicines. These may be given in the form of a pill or a vaginal cream. A second round of antibiotics may be prescribed if the condition comes back after treatment. Because bacterial vaginosis increases your risk for sexually transmitted diseases, getting  treated can help reduce your risk for chlamydia, gonorrhea, HIV, and herpes. °HOME CARE INSTRUCTIONS  °· Only take over-the-counter or prescription medicines as directed by your health care provider. °· If antibiotic medicine was prescribed, take it as directed. Make sure you finish it even if you start to feel better. °· Tell all sexual partners that you have a vaginal infection. They should see their health care provider and be treated if they have problems, such as a mild rash or itching. °· During treatment, it is important that you follow these instructions: °· Avoid sexual activity or use condoms correctly. °· Do not douche. °· Avoid alcohol as directed by your health care provider. °· Avoid breastfeeding as directed by your health care provider. °SEEK MEDICAL CARE IF:  °· Your symptoms are not improving after 3 days of treatment. °· You have increased discharge or pain. °· You have a fever. °MAKE SURE YOU:  °· Understand these instructions. °· Will watch your condition. °· Will get help right away if you are not doing well or get worse. °FOR MORE INFORMATION  °Centers for Disease Control and Prevention, Division of STD Prevention: www.cdc.gov/std °American Sexual Health Association (ASHA): www.ashastd.org  °  °This information is not intended to replace advice given to you by your health care provider. Make sure you discuss any questions you have with your health care provider. °  °Document Released: 08/30/2005 Document Revised: 09/20/2014 Document Reviewed: 04/11/2013 °Elsevier Interactive Patient Education ©2016 [employer] °First Trimester of Pregnancy °The first trimester of pregnancy is from week 1 until the end of week 12 (months 1 through 3). A week after a sperm fertilizes an egg, the egg will implant on the   wall of the uterus. This embryo will begin to develop into a baby. Genes from you and your partner are forming the baby. The female genes determine whether the baby is a boy or a girl. At 6-8  weeks, the eyes and face are formed, and the heartbeat can be seen on ultrasound. At the end of 12 weeks, all the baby's organs are formed.  °Now that you are pregnant, you will want to do everything you can to have a healthy baby. Two of the most important things are to get good prenatal care and to follow your health care provider's instructions. Prenatal care is all the medical care you receive before the baby's birth. This care will help prevent, find, and treat any problems during the pregnancy and childbirth. °BODY CHANGES °Your body goes through many changes during pregnancy. The changes vary from woman to woman.  °· You may gain or lose a couple of pounds at first. °· You may feel sick to your stomach (nauseous) and throw up (vomit). If the vomiting is uncontrollable, call your health care provider. °· You may tire easily. °· You may develop headaches that can be relieved by medicines approved by your health care provider. °· You may urinate more often. Painful urination may mean you have a bladder infection. °· You may develop heartburn as a result of your pregnancy. °· You may develop constipation because certain hormones are causing the muscles that push waste through your intestines to slow down. °· You may develop hemorrhoids or swollen, bulging veins (varicose veins). °· Your breasts may begin to grow larger and become tender. Your nipples may stick out more, and the tissue that surrounds them (areola) may become darker. °· Your gums may bleed and may be sensitive to brushing and flossing. °· Dark spots or blotches (chloasma, mask of pregnancy) may develop on your face. This will likely fade after the baby is born. °· Your menstrual periods will stop. °· You may have a loss of appetite. °· You may develop cravings for certain kinds of food. °· You may have changes in your emotions from day to day, such as being excited to be pregnant or being concerned that something may go wrong with the pregnancy and  baby. °· You may have more vivid and strange dreams. °· You may have changes in your hair. These can include thickening of your hair, rapid growth, and changes in texture. Some women also have hair loss during or after pregnancy, or hair that feels dry or thin. Your hair will most likely return to normal after your baby is born. °WHAT TO EXPECT AT YOUR PRENATAL VISITS °During a routine prenatal visit: °· You will be weighed to make sure you and the baby are growing normally. °· Your blood pressure will be taken. °· Your abdomen will be measured to track your baby's growth. °· The fetal heartbeat will be listened to starting around week 10 or 12 of your pregnancy. °· Test results from any previous visits will be discussed. °Your health care provider may ask you: °· How you are feeling. °· If you are feeling the baby move. °· If you have had any abnormal symptoms, such as leaking fluid, bleeding, severe headaches, or abdominal cramping. °· If you are using any tobacco products, including cigarettes, chewing tobacco, and electronic cigarettes. °· If you have any questions. °Other tests that may be performed during your first trimester include: °· Blood tests to find your blood type and to check for   the presence of any previous infections. They will also be used to check for low iron levels (anemia) and Rh antibodies. Later in the pregnancy, blood tests for diabetes will be done along with other tests if problems develop. °· Urine tests to check for infections, diabetes, or protein in the urine. °· An ultrasound to confirm the proper growth and development of the baby. °· An amniocentesis to check for possible genetic problems. °· Fetal screens for spina bifida and Down syndrome. °· You may need other tests to make sure you and the baby are doing well. °· HIV (human immunodeficiency virus) testing. Routine prenatal testing includes screening for HIV, unless you choose not to have this test. °HOME CARE INSTRUCTIONS    °Medicines °· Follow your health care provider's instructions regarding medicine use. Specific medicines may be either safe or unsafe to take during pregnancy. °· Take your prenatal vitamins as directed. °· If you develop constipation, try taking a stool softener if your health care provider approves. °Diet °· Eat regular, well-balanced meals. Choose a variety of foods, such as meat or vegetable-based protein, fish, milk and low-fat dairy products, vegetables, fruits, and whole grain breads and cereals. Your health care provider will help you determine the amount of weight gain that is right for you. °· Avoid raw meat and uncooked cheese. These carry germs that can cause birth defects in the baby. °· Eating four or five small meals rather than three large meals a day may help relieve nausea and vomiting. If you start to feel nauseous, eating a few soda crackers can be helpful. Drinking liquids between meals instead of during meals also seems to help nausea and vomiting. °· If you develop constipation, eat more high-fiber foods, such as fresh vegetables or fruit and whole grains. Drink enough fluids to keep your urine clear or pale yellow. °Activity and Exercise °· Exercise only as directed by your health care provider. Exercising will help you: °¨ Control your weight. °¨ Stay in shape. °¨ Be prepared for labor and delivery. °· Experiencing pain or cramping in the lower abdomen or low back is a good sign that you should stop exercising. Check with your health care provider before continuing normal exercises. °· Try to avoid standing for long periods of time. Move your legs often if you must stand in one place for a long time. °· Avoid heavy lifting. °· Wear low-heeled shoes, and practice good posture. °· You may continue to have sex unless your health care provider directs you otherwise. °Relief of Pain or Discomfort °· Wear a good support bra for breast tenderness.   °· Take warm sitz baths to soothe any pain or  discomfort caused by hemorrhoids. Use hemorrhoid cream if your health care provider approves.   °· Rest with your legs elevated if you have leg cramps or low back pain. °· If you develop varicose veins in your legs, wear support hose. Elevate your feet for 15 minutes, 3-4 times a day. Limit salt in your diet. °Prenatal Care °· Schedule your prenatal visits by the twelfth week of pregnancy. They are usually scheduled monthly at first, then more often in the last 2 months before delivery. °· Write down your questions. Take them to your prenatal visits. °· Keep all your prenatal visits as directed by your health care provider. °Safety °· Wear your seat belt at all times when driving. °· Make a list of emergency phone numbers, including numbers for family, friends, the hospital, and police and fire departments. °General   Tips °· Ask your health care provider for a referral to a local prenatal education class. Begin classes no later than at the beginning of month 6 of your pregnancy. °· Ask for help if you have counseling or nutritional needs during pregnancy. Your health care provider can offer advice or refer you to specialists for help with various needs. °· Do not use hot tubs, steam rooms, or saunas. °· Do not douche or use tampons or scented sanitary pads. °· Do not cross your legs for long periods of time. °· Avoid cat litter boxes and soil used by cats. These carry germs that can cause birth defects in the baby and possibly loss of the fetus by miscarriage or stillbirth. °· Avoid all smoking, herbs, alcohol, and medicines not prescribed by your health care provider. Chemicals in these affect the formation and growth of the baby. °· Do not use any tobacco products, including cigarettes, chewing tobacco, and electronic cigarettes. If you need help quitting, ask your health care provider. You may receive counseling support and other resources to help you quit. °· Schedule a dentist appointment. At home, brush your  teeth with a soft toothbrush and be gentle when you floss. °SEEK MEDICAL CARE IF:  °· You have dizziness. °· You have mild pelvic cramps, pelvic pressure, or nagging pain in the abdominal area. °· You have persistent nausea, vomiting, or diarrhea. °· You have a bad smelling vaginal discharge. °· You have pain with urination. °· You notice increased swelling in your face, hands, legs, or ankles. °SEEK IMMEDIATE MEDICAL CARE IF:  °· You have a fever. °· You are leaking fluid from your vagina. °· You have spotting or bleeding from your vagina. °· You have severe abdominal cramping or pain. °· You have rapid weight gain or loss. °· You vomit blood or material that looks like coffee grounds. °· You are exposed to German measles and have never had them. °· You are exposed to fifth disease or chickenpox. °· You develop a severe headache. °· You have shortness of breath. °· You have any kind of trauma, such as from a fall or a car accident. °  °This information is not intended to replace advice given to you by your health care provider. Make sure you discuss any questions you have with your health care provider. °  °Document Released: 08/24/2001 Document Revised: 09/20/2014 Document Reviewed: 07/10/2013 °Elsevier Interactive Patient Education ©2016 [employer] ° °

## 2016-01-25 NOTE — MAU Provider Note (Signed)
History     CSN: [id number]  Arrival date and time: 01/25/16 1638   First Provider Initiated Contact with Patient 01/25/16 1700      Chief Complaint  Patient presents with  . Abdominal Pain  . Nausea   HPI Comments: Shelby Serrano is a 20 y.o. [id number] at [redacted]w[redacted]d presenting via EMS with abdominal pain and N/V. diarrhea. Symptoms have been intermittent over the past several weeks. Had 3 loose BMs today and vomited earlier. NPC. Reports known LMP and episodes of light spotting once in Feb and once in March.    Abdominal Pain This is a recurrent problem. The current episode started in the past 7 days. The onset quality is gradual. The problem occurs daily. The problem has been unchanged. The pain is located in the generalized abdominal region. The pain is at a severity of 6/10. The pain is moderate. The quality of the pain is aching and cramping. The abdominal pain radiates to the back. Associated symptoms include diarrhea. Pertinent negatives include no anorexia, constipation, dysuria, fever, flatus or frequency. Associated symptoms comments: N/V improving but vomits about once every 2-3 days. Has had intermittent loose stools associated with left sided abdominal pain.    OB History  Gravida Para Term Preterm AB SAB TAB Ectopic Multiple Living  4 1 1  2 2    1     # Outcome Date GA Lbr Len/2nd Weight Sex Delivery Anes PTL Lv  4 Current           3 SAB           2 SAB           1 Term                No past medical history on file.  History reviewed. No pertinent past surgical history.  No family history on file.  Social History  Substance Use Topics  . Smoking status: Current Some Day Smoker    Types: Cigarettes  . Smokeless tobacco: None  . Alcohol Use: No    Allergies: No Known Allergies  Prescriptions prior to admission  Medication Sig Dispense Refill Last Dose  . omeprazole (PRILOSEC) 20 MG capsule Take 1 capsule (20 mg total) by mouth daily. 30 capsule 0   .  ondansetron (ZOFRAN ODT) 8 MG disintegrating tablet Take 1 tablet (8 mg total) by mouth every 8 (eight) hours as needed for nausea or vomiting. (Patient not taking: Reported on 11/01/2015) 12 tablet 0 Completed Course at Unknown time  . risperiDONE (RISPERDAL) 1 MG tablet Take 1 mg by mouth daily.    Past Week at Unknown time  . sucralfate (CARAFATE) 1 GM/10ML suspension Take 10 mLs (1 g total) by mouth 4 (four) times daily -  with meals and at bedtime. 420 mL 0     Review of Systems  Constitutional: Negative for fever.  Gastrointestinal: Positive for abdominal pain and diarrhea. Negative for constipation, anorexia and flatus.  Genitourinary: Negative for dysuria and frequency.   Physical Exam   Blood pressure 112/87, pulse 69, temperature 98.1 F (36.7 C), temperature source Oral, resp. rate 18, last menstrual period 09/21/2015, unknown if currently breastfeeding.  Physical Exam  Nursing note and vitals reviewed. Constitutional: She is oriented to person, place, and time. No distress.  Neck: Normal range of motion.  GI: Soft. There is tenderness. There is no rebound and no guarding.  DT FHR 160 14-16 wk size, mild diffuse TTP in lower quadrants  Genitourinary:  NEFG Vagina with large amount white homogenous discahrge.  CX L/C/H Uterus NT 14-16 wk size  Musculoskeletal: Normal range of motion.  Neurological: She is alert and oriented to person, place, and time.  Skin: Skin is warm and dry.  Psychiatric: She has a normal mood and affect. Her behavior is normal.    MAU Course  Procedures Results for orders placed or performed during the hospital encounter of 01/25/16 (from the past 24 hour(s))  Urinalysis, Routine w reflex microscopic (not at [facility])     Status: None   Collection Time: 01/25/16  4:50 PM  Result Value Ref Range   Color, Urine YELLOW YELLOW   APPearance CLEAR CLEAR   Specific Gravity, Urine 1.010 1.005 - 1.030   pH 6.0 5.0 - 8.0   Glucose, UA NEGATIVE NEGATIVE  mg/dL   Hgb urine dipstick NEGATIVE NEGATIVE   Bilirubin Urine NEGATIVE NEGATIVE   Ketones, ur NEGATIVE NEGATIVE mg/dL   Protein, ur NEGATIVE NEGATIVE mg/dL   Nitrite NEGATIVE NEGATIVE   Leukocytes, UA NEGATIVE NEGATIVE  Wet prep, genital     Status: Abnormal   Collection Time: 01/25/16  5:15 PM  Result Value Ref Range   Yeast Wet Prep HPF POC NONE SEEN NONE SEEN   Trich, Wet Prep NONE SEEN NONE SEEN   Clue Cells Wet Prep HPF POC PRESENT (A) NONE SEEN   WBC, Wet Prep HPF POC MANY (A) NONE SEEN   Sperm NONE SEEN   CBC     Status: Abnormal   Collection Time: 01/25/16  5:24 PM  Result Value Ref Range   WBC 12.0 (H) 4.0 - 10.5 K/uL   RBC 4.06 3.87 - 5.11 MIL/uL   Hemoglobin 12.1 12.0 - 15.0 g/dL   HCT 16.1 (L) 09.6 - 04.5 %   MCV 85.2 78.0 - 100.0 fL   MCH 29.8 26.0 - 34.0 pg   MCHC 35.0 30.0 - 36.0 g/dL   RDW 40.9 81.1 - 91.4 %   Platelets 168 150 - 400 K/uL  Basic metabolic panel     Status: None   Collection Time: 01/25/16  5:24 PM  Result Value Ref Range   Sodium 136 135 - 145 mmol/L   Potassium 3.7 3.5 - 5.1 mmol/L   Chloride 105 101 - 111 mmol/L   CO2 23 22 - 32 mmol/L   Glucose, Bld 74 65 - 99 mg/dL   BUN 12 6 - 20 mg/dL   Creatinine, Ser 7.82 0.44 - 1.00 mg/dL   Calcium 9.3 8.9 - 95.6 mg/dL   GFR calc non Af Amer >60 >60 mL/min   GFR calc Af Amer >60 >60 mL/min   Anion gap 8 5 - 15  ABO/Rh     Status: None (Preliminary result)   Collection Time: 01/25/16  5:24 PM  Result Value Ref Range   ABO/RH(D) O POS   . Prelim [place]: BPD c/w [redacted]w[redacted]d, normal AFV, plac post  Assessment and Plan   1. BV (bacterial vaginosis)   2. Uterine size date discrepancy pregnancy, second trimester   3. Nausea and vomiting during pregnancy prior to [redacted] weeks gestation      Medication List    STOP taking these medications        omeprazole 20 MG capsule  Commonly known as:  PRILOSEC     sucralfate 1 GM/10ML suspension  Commonly known as:  CARAFATE      TAKE these medications         metroNIDAZOLE 500 MG tablet  Commonly known as:  FLAGYL  Take 1 tablet (500 mg total) by mouth 2 (two) times daily.     prenatal vitamin w/FE, FA 27-1 MG Tabs tablet  Take 1 tablet by mouth daily.     promethazine 12.5 MG tablet  Commonly known as:  PHENERGAN  Take 1 tablet (12.5 mg total) by mouth every 6 (six) hours as needed for nausea or vomiting.       Follow-up Information    [facility] Schedule an appointment as soon as possible for a visit in 1 week.   Contact information:   [address] [place] [address] [phone]       [facility].   Specialty:  Obstetrics and Gynecology   Why:  Someone from Clinic will call you with appt.   Contact information:   [address] [place] [phone]      Jaysa Kise 01/25/2016, 5:01 PM

## 2016-01-25 NOTE — MAU Note (Signed)
Onset of generalized abdominal pain x 2 weeks, has had a lot of nausea/vomiting and diarrhea here and there, diarrhea x 3 episodes today, no vaginal discharge or bleeding.

## 2016-01-26 LAB — GC/CHLAMYDIA PROBE AMP (~~LOC~~) NOT AT ARMC
Chlamydia: NEGATIVE
Neisseria Gonorrhea: NEGATIVE

## 2016-01-26 LAB — RPR: RPR: NONREACTIVE

## 2016-01-26 LAB — RUBELLA SCREEN: RUBELLA: 2.43 {index} (ref >0.99)

## 2016-01-26 LAB — HIV ANTIBODY (ROUTINE TESTING W REFLEX): HIV SCREEN 4TH GENERATION: NONREACTIVE

## 2016-03-02 ENCOUNTER — Inpatient Hospital Stay (HOSPITAL_COMMUNITY)
Admission: AD | Admit: 2016-03-02 | Discharge: 2016-03-02 | Disposition: A | Discharge status: Home / Self Care | Payer: Medicaid - Out of State | Source: Ambulatory Visit | Attending: Family Medicine | Admitting: Family Medicine

## 2016-03-02 ENCOUNTER — Encounter (HOSPITAL_COMMUNITY): Payer: Self-pay | Admitting: *Deleted

## 2016-03-02 ENCOUNTER — Other Ambulatory Visit: Payer: Self-pay

## 2016-03-02 DIAGNOSIS — R55 Syncope and collapse: Secondary | ICD-10-CM | POA: Insufficient documentation

## 2016-03-02 DIAGNOSIS — O26899 Other specified pregnancy related conditions, unspecified trimester: Secondary | ICD-10-CM

## 2016-03-02 DIAGNOSIS — O99332 Smoking (tobacco) complicating pregnancy, second trimester: Secondary | ICD-10-CM | POA: Insufficient documentation

## 2016-03-02 DIAGNOSIS — O219 Vomiting of pregnancy, unspecified: Secondary | ICD-10-CM

## 2016-03-02 DIAGNOSIS — R079 Chest pain, unspecified: Secondary | ICD-10-CM | POA: Insufficient documentation

## 2016-03-02 DIAGNOSIS — O26892 Other specified pregnancy related conditions, second trimester: Secondary | ICD-10-CM | POA: Insufficient documentation

## 2016-03-02 DIAGNOSIS — Z3A20 20 weeks gestation of pregnancy: Secondary | ICD-10-CM | POA: Insufficient documentation

## 2016-03-02 DIAGNOSIS — R109 Unspecified abdominal pain: Secondary | ICD-10-CM | POA: Insufficient documentation

## 2016-03-02 DIAGNOSIS — O212 Late vomiting of pregnancy: Secondary | ICD-10-CM | POA: Insufficient documentation

## 2016-03-02 DIAGNOSIS — R112 Nausea with vomiting, unspecified: Secondary | ICD-10-CM | POA: Insufficient documentation

## 2016-03-02 DIAGNOSIS — O9989 Other specified diseases and conditions complicating pregnancy, childbirth and the puerperium: Secondary | ICD-10-CM

## 2016-03-02 LAB — URINALYSIS, ROUTINE W REFLEX MICROSCOPIC
BILIRUBIN URINE: NEGATIVE
GLUCOSE, UA: NEGATIVE mg/dL
HGB URINE DIPSTICK: NEGATIVE
KETONES UR: NEGATIVE mg/dL
Leukocytes, UA: NEGATIVE
NITRITE: NEGATIVE
PH: 6.5 (ref 5.0–8.0)
Protein, ur: NEGATIVE mg/dL

## 2016-03-02 LAB — COMPREHENSIVE METABOLIC PANEL
ALT: 13 U/L — AB (ref 14–54)
ANION GAP: 5 (ref 5–15)
AST: 22 U/L (ref 15–41)
Albumin: 3 g/dL — ABNORMAL LOW (ref 3.5–5.0)
Alkaline Phosphatase: 64 U/L (ref 38–126)
BUN: 7 mg/dL (ref 6–20)
CHLORIDE: 106 mmol/L (ref 101–111)
CO2: 25 mmol/L (ref 22–32)
CREATININE: 0.67 mg/dL (ref 0.44–1.00)
Calcium: 8.8 mg/dL — ABNORMAL LOW (ref 8.9–10.3)
Glucose, Bld: 76 mg/dL (ref 65–99)
POTASSIUM: 3.7 mmol/L (ref 3.5–5.1)
SODIUM: 136 mmol/L (ref 135–145)
Total Bilirubin: 0.4 mg/dL (ref 0.3–1.2)
Total Protein: 6 g/dL — ABNORMAL LOW (ref 6.5–8.1)

## 2016-03-02 LAB — CBC
HCT: 32.1 % — ABNORMAL LOW (ref 36.0–46.0)
Hemoglobin: 11.1 g/dL — ABNORMAL LOW (ref 12.0–15.0)
MCH: 29.4 pg (ref 26.0–34.0)
MCHC: 34.6 g/dL (ref 30.0–36.0)
MCV: 84.9 fL (ref 78.0–100.0)
PLATELETS: 145 10*3/uL — AB (ref 150–400)
RBC: 3.78 MIL/uL — AB (ref 3.87–5.11)
RDW: 13.3 % (ref 11.5–15.5)
WBC: 12 10*3/uL — ABNORMAL HIGH (ref 4.0–10.5)

## 2016-03-02 MED ORDER — PRENATAL PLUS 27-1 MG PO TABS: 1.0000 | ORAL_TABLET | Freq: Every day | ORAL | Status: DC

## 2016-03-02 MED ORDER — PROMETHAZINE HCL 25 MG PO TABS: 12.5000 mg | ORAL_TABLET | Freq: Four times a day (QID) | ORAL | Status: DC | PRN

## 2016-03-02 NOTE — MAU Provider Note (Signed)
Chief Complaint: Abdominal Pain   First Provider Initiated Contact with Patient 03/02/16 1443      SUBJECTIVE HPI: Shelby Serrano is a 20 y.o. G4P1021 at [redacted]w[redacted]d by LMP who presents to maternity admissions reporting abdominal cramping x 3 days with episode of feeling hot, nauseous, and dizzy with intermittent chest pain while at work today. She reports vomiting x 2-3 today.  She reports she is eating and drinking enough.  Otherwise she has not tried any treatments. She has her initial OB visit next week at [facility].  She denies vaginal discharge/itching/burning.   She denies vaginal bleeding, urinary symptoms, or h/a.    HPI  History reviewed. No pertinent past medical history. History reviewed. No pertinent past surgical history. Social History   Social History  . Marital Status: Single    Spouse Name: N/A  . Number of Children: N/A  . Years of Education: N/A   Occupational History  . Not on file.   Social History Main Topics  . Smoking status: Current Some Day Smoker    Types: Cigarettes  . Smokeless tobacco: Not on file  . Alcohol Use: No  . Drug Use: No  . Sexual Activity: Yes    Birth Control/ Protection: None   Other Topics Concern  . Not on file   Social History Narrative   No current facility-administered medications on file prior to encounter.   No current outpatient prescriptions on file prior to encounter.   No Known Allergies  ROS:  Review of Systems  Constitutional: Negative for fever, chills and fatigue.  Respiratory: Negative for shortness of breath.   Cardiovascular: Negative for chest pain.  Gastrointestinal: Positive for abdominal pain.  Genitourinary: Positive for pelvic pain. Negative for dysuria, flank pain, vaginal bleeding, vaginal discharge, difficulty urinating and vaginal pain.  Neurological: Negative for dizziness and headaches.  Psychiatric/Behavioral: Negative.      I have reviewed patient's Past Medical Hx,  Surgical Hx, Family Hx, Social Hx, medications and allergies.   Physical Exam   Patient Vitals for the past 24 hrs:  BP Temp Pulse Resp Height  03/02/16 1628 (!) 104/53 mmHg 98.1 F (36.7 C) 68 18 -  03/02/16 1429 107/69 mmHg 97.8 F (36.6 C) 66 18  (1.549 m)   Constitutional: Well-developed, well-nourished female in no acute distress.  Cardiovascular: normal rate Respiratory: normal effort GI: Abd soft, non-tender. Pos BS x 4 MS: Extremities nontender, no edema, normal ROM Neurologic: Alert and oriented x 4.  GU: Neg CVAT.  PELVIC EXAM: Deferred  FHT 145 by doppler  LAB RESULTS Results for orders placed or performed during the hospital encounter of 03/02/16 (from the past 24 hour(s))  Urinalysis, Routine w reflex microscopic (not at [facility])     Status: Abnormal   Collection Time: 03/02/16  2:45 PM  Result Value Ref Range   Color, Urine YELLOW YELLOW   APPearance CLEAR CLEAR   Specific Gravity, Urine <1.005 (L) 1.005 - 1.030   pH 6.5 5.0 - 8.0   Glucose, UA NEGATIVE NEGATIVE mg/dL   Hgb urine dipstick NEGATIVE NEGATIVE   Bilirubin Urine NEGATIVE NEGATIVE   Ketones, ur NEGATIVE NEGATIVE mg/dL   Protein, ur NEGATIVE NEGATIVE mg/dL   Nitrite NEGATIVE NEGATIVE   Leukocytes, UA NEGATIVE NEGATIVE  CBC     Status: Abnormal   Collection Time: 03/02/16  3:06 PM  Result Value Ref Range   WBC 12.0 (H) 4.0 - 10.5 K/uL   RBC 3.78 (L) 3.87 -  5.11 MIL/uL   Hemoglobin 11.1 (L) 12.0 - 15.0 g/dL   HCT 09.832.1 (L) 11.936.0 - 14.746.0 %   MCV 84.9 78.0 - 100.0 fL   MCH 29.4 26.0 - 34.0 pg   MCHC 34.6 30.0 - 36.0 g/dL   RDW 82.913.3 56.211.5 - 13.015.5 %   Platelets 145 (L) 150 - 400 K/uL  Comprehensive metabolic panel     Status: Abnormal   Collection Time: 03/02/16  3:06 PM  Result Value Ref Range   Sodium 136 135 - 145 mmol/L   Potassium 3.7 3.5 - 5.1 mmol/L   Chloride 106 101 - 111 mmol/L   CO2 25 22 - 32 mmol/L   Glucose, Bld 76 65 - 99 mg/dL   BUN 7 6 - 20 mg/dL   Creatinine, Ser 8.650.67 0.44 -  1.00 mg/dL   Calcium 8.8 (L) 8.9 - 10.3 mg/dL   Total Protein 6.0 (L) 6.5 - 8.1 g/dL   Albumin 3.0 (L) 3.5 - 5.0 g/dL   AST 22 15 - 41 U/L   ALT 13 (L) 14 - 54 U/L   Alkaline Phosphatase 64 38 - 126 U/L   Total Bilirubin 0.4 0.3 - 1.2 mg/dL   GFR calc non Af Amer >60 >60 mL/min   GFR calc Af Amer >60 >60 mL/min   Anion gap 5 5 - 15    --/--/O POS (05/14 1724)  IMAGING No results found.  MAU Management/MDM: Ordered labs and reviewed results.  Initially planned pelvic exam but pt reports all symptoms resolved and denies pain while in MAU so no exam performed.  EKG wnl.  Likely blood sugar drop or possibly r/t mild anemia but pt hgb 11.1.  Recommend pt eat and drink regularly and pick up Rx for nausea medication and PNV from previous MAU visit.  Note for pt to miss work today.  Pt stable at time of discharge.  ASSESSMENT 1. Abdominal pain affecting pregnancy   2. Near syncope   3. Chest pain, unspecified chest pain type   4. Nausea and vomiting during pregnancy prior to [redacted] weeks gestation   5. [redacted] weeks gestation of pregnancy     PLAN Discharge home    Medication List    STOP taking these medications        metroNIDAZOLE 500 MG tablet  Commonly known as:  FLAGYL      TAKE these medications        prenatal vitamin w/FE, FA 27-1 MG Tabs tablet  Take 1 tablet by mouth daily.     promethazine 25 MG tablet  Commonly known as:  PHENERGAN  Take 0.5-1 tablets (12.5-25 mg total) by mouth every 6 (six) hours as needed for nausea or vomiting.       Follow-up Information    [facility] with [facility].   Specialty:  Obstetrics and Gynecology   Why:  As scheduled, Return to MAU as needed for emergencies   Contact information:   [address] [place] [phone]      Sharen CounterLisa Leftwich-Kirby Certified Nurse-Midwife 03/02/2016  5:35 PM

## 2016-03-02 NOTE — MAU Note (Signed)
Respiratory Called for EKG

## 2016-03-02 NOTE — Progress Notes (Signed)
Sharen CounterLisa Leftwich-Kirby CNM in to discuss d/c plan. Written and verbal d/c instructions given and understanding voiced.

## 2016-03-02 NOTE — Discharge Instructions (Signed)
Morning Sickness Morning sickness is when you feel sick to your stomach (nauseous) during pregnancy. This nauseous feeling may or may not come with vomiting. It often occurs in the morning but can be a problem any time of day. Morning sickness is most common during the first trimester, but it may continue throughout pregnancy. While morning sickness is unpleasant, it is usually harmless unless you develop severe and continual vomiting (hyperemesis gravidarum). This condition requires more intense treatment.  CAUSES  The cause of morning sickness is not completely known but seems to be related to normal hormonal changes that occur in pregnancy. RISK FACTORS You are at greater risk if you:  Experienced nausea or vomiting before your pregnancy.  Had morning sickness during a previous pregnancy.  Are pregnant with more than one baby, such as twins. TREATMENT  Do not use any medicines (prescription, over-the-counter, or herbal) for morning sickness without first talking to your health care provider. Your health care provider may prescribe or recommend:  Vitamin B6 supplements.  Anti-nausea medicines.  The herbal medicine ginger. HOME CARE INSTRUCTIONS   Only take over-the-counter or prescription medicines as directed by your health care provider.  Taking multivitamins before getting pregnant can prevent or decrease the severity of morning sickness in most women.  Eat a piece of dry toast or unsalted crackers before getting out of bed in the morning.  Eat five or six small meals a day.  Eat dry and bland foods (rice, baked potato). Foods high in carbohydrates are often helpful.  Do not drink liquids with your meals. Drink liquids between meals.  Avoid greasy, fatty, and spicy foods.  Get someone to cook for you if the smell of any food causes nausea and vomiting.  If you feel nauseous after taking prenatal vitamins, take the vitamins at night or with a snack.  Snack on protein  foods (nuts, yogurt, cheese) between meals if you are hungry.  Eat unsweetened gelatins for desserts.  Wearing an acupressure wristband (worn for sea sickness) may be helpful.  Acupuncture may be helpful.  Do not smoke.  Get a humidifier to keep the air in your house free of odors.  Get plenty of fresh air. SEEK MEDICAL CARE IF:   Your home remedies are not working, and you need medicine.  You feel dizzy or lightheaded.  You are losing weight. SEEK IMMEDIATE MEDICAL CARE IF:   You have persistent and uncontrolled nausea and vomiting.  You pass out (faint). MAKE SURE YOU:  Understand these instructions.  Will watch your condition.  Will get help right away if you are not doing well or get worse.   This information is not intended to replace advice given to you by your health care provider. Make sure you discuss any questions you have with your health care provider.   Document Released: 10/21/2006 Document Revised: 09/04/2013 Document Reviewed: 02/14/2013 Elsevier Interactive Patient Education 2016 [employer]. Near-Syncope Near-syncope (commonly known as near fainting) is sudden weakness, dizziness, or feeling like you might pass out. During an episode of near-syncope, you may also develop pale skin, have tunnel vision, or feel sick to your stomach (nauseous). Near-syncope may occur when getting up after sitting or while standing for a long time. It is caused by a sudden decrease in blood flow to the brain. This decrease can result from various causes or triggers, most of which are not serious. However, because near-syncope can sometimes be a sign of something serious, a medical evaluation is required. The specific  cause is often not determined. HOME CARE INSTRUCTIONS  Monitor your condition for any changes. The following actions may help to alleviate any discomfort you are experiencing:  Have someone stay with you until you feel stable.  Lie down right away and prop your  feet up if you start feeling like you might faint. Breathe deeply and steadily. Wait until all the symptoms have passed. Most of these episodes last only a few minutes. You may feel tired for several hours.   Drink enough fluids to keep your urine clear or pale yellow.   If you are taking blood pressure or heart medicine, get up slowly when seated or lying down. Take several minutes to sit and then stand. This can reduce dizziness.  Follow up with your health care provider as directed. SEEK IMMEDIATE MEDICAL CARE IF:   You have a severe headache.   You have unusual pain in the chest, abdomen, or back.   You are bleeding from the mouth or rectum, or you have black or tarry stool.   You have an irregular or very fast heartbeat.   You have repeated fainting or have seizure-like jerking during an episode.   You faint when sitting or lying down.   You have confusion.   You have difficulty walking.   You have severe weakness.   You have vision problems.  MAKE SURE YOU:   Understand these instructions.  Will watch your condition.  Will get help right away if you are not doing well or get worse.   This information is not intended to replace advice given to you by your health care provider. Make sure you discuss any questions you have with your health care provider.   Document Released: 08/30/2005 Document Revised: 09/04/2013 Document Reviewed: 02/02/2013 Elsevier Interactive Patient Education [employer] Elsevier Inc.

## 2016-03-02 NOTE — MAU Note (Signed)
Abd pain yesterday but worse today at work. I only feel it at work as I lift, pull, and push a lot. No LOF or bleeding. Some white vag d/c

## 2016-03-10 ENCOUNTER — Ambulatory Visit (INDEPENDENT_AMBULATORY_CARE_PROVIDER_SITE_OTHER): Payer: Medicaid - Out of State | Admitting: Certified Nurse Midwife

## 2016-03-10 ENCOUNTER — Encounter: Payer: Self-pay | Admitting: Certified Nurse Midwife

## 2016-03-10 ENCOUNTER — Encounter: Payer: Self-pay | Admitting: Clinical

## 2016-03-10 VITALS — BP 121/60 | HR 66 | Temp 98.0°F | Ht 60.0 in | Wt 109.2 lb

## 2016-03-10 DIAGNOSIS — O0930 Supervision of pregnancy with insufficient antenatal care, unspecified trimester: Secondary | ICD-10-CM | POA: Insufficient documentation

## 2016-03-10 DIAGNOSIS — Z3480 Encounter for supervision of other normal pregnancy, unspecified trimester: Secondary | ICD-10-CM

## 2016-03-10 DIAGNOSIS — Z3482 Encounter for supervision of other normal pregnancy, second trimester: Secondary | ICD-10-CM

## 2016-03-10 DIAGNOSIS — O0932 Supervision of pregnancy with insufficient antenatal care, second trimester: Secondary | ICD-10-CM

## 2016-03-10 HISTORY — DX: Encounter for supervision of other normal pregnancy, unspecified trimester: Z34.80

## 2016-03-10 LAB — POCT URINALYSIS DIP (DEVICE)
Bilirubin Urine: NEGATIVE
Glucose, UA: NEGATIVE mg/dL
Hgb urine dipstick: NEGATIVE
KETONES UR: NEGATIVE mg/dL
Nitrite: NEGATIVE
PH: 6 (ref 5.0–8.0)
PROTEIN: NEGATIVE mg/dL
SPECIFIC GRAVITY, URINE: 1.015 (ref 1.005–1.030)
Urobilinogen, UA: 0.2 mg/dL (ref 0.0–1.0)

## 2016-03-10 NOTE — Progress Notes (Signed)
   Subjective:    Shelby Serrano is a [id number] 4264w0d being seen today for her first obstetrical visit.  Her obstetrical history is significant for late to care. Patient does not intend to breast feed. Pregnancy history fully reviewed.  Patient reports no complaints.  Filed Vitals:   03/10/16 1037 03/10/16 1039  BP: 121/60   Pulse: 66   Temp: 98 F (36.7 C)   Height:  5' (1.524 m)  Weight: 109 lb 3.2 oz (49.533 kg)     HISTORY: OB History  Gravida Para Term Preterm AB SAB TAB Ectopic Multiple Living  4 1 1  2 2    1     # Outcome Date GA Lbr Len/2nd Weight Sex Delivery Anes PTL Lv  4 Current           3 SAB 07/2015 6456w0d         2 SAB 04/2015          1 Term 04/05/12 81105w0d  6 lb 2 oz (2.778 kg)  Vag-Spont   Y     Comments: no complications     History reviewed. No pertinent past medical history. History reviewed. No pertinent past surgical history. Family History  Problem Relation Age of Onset  . Diabetes Mother   . Hypertension Mother      Exam                                           Skin: normal coloration and turgor, no rashes    Neurologic: oriented, normal   Extremities: normal strength, tone, and muscle mass   HEENT PERRLA   Mouth/Teeth mucous membranes moist, pharynx normal without lesions   Neck supple   Cardiovascular: regular rate and rhythm   Respiratory:  appears well, vitals normal, no respiratory distress, acyanotic, normal RR, ear and throat exam is normal, neck free of mass or lymphadenopathy, chest clear, no wheezing, crepitations, rhonchi, normal symmetric air entry   Abdomen: soft, non-tender; bowel sounds normal; no masses,  no organomegaly   Urinary:       Assessment:    Pregnancy: [id number] Patient Active Problem List   Diagnosis Date Noted  . Late prenatal care 03/10/2016  . Supervision of normal subsequent pregnancy 03/10/2016        Plan:     Initial labs drawn. Prenatal vitamins. Problem list reviewed and  updated. Ultrasound discussed; fetal survey: ordered. Peds list provided Follow up in 4 weeks.    Donette LarryMelanie Arelly Whittenberg, CNM 03/10/2016

## 2016-03-10 NOTE — Progress Notes (Signed)
Brief introduction to BHC services at [facility] 

## 2016-03-10 NOTE — Progress Notes (Signed)
Here for initial prenatal visit. Given new patient education packet.

## 2016-03-11 LAB — GC/CHLAMYDIA PROBE AMP (~~LOC~~) NOT AT ARMC
CHLAMYDIA, DNA PROBE: NEGATIVE
Neisseria Gonorrhea: NEGATIVE

## 2016-03-12 LAB — CULTURE, OB URINE: Colony Count: 75000

## 2016-03-22 ENCOUNTER — Other Ambulatory Visit (HOSPITAL_COMMUNITY): Payer: Self-pay | Admitting: Advanced Practice Midwife

## 2016-03-22 ENCOUNTER — Ambulatory Visit (HOSPITAL_COMMUNITY)
Admission: RE | Admit: 2016-03-22 | Discharge: 2016-03-22 | Disposition: A | Discharge status: Home / Self Care | Payer: Self-pay | Source: Ambulatory Visit | Attending: Advanced Practice Midwife | Admitting: Advanced Practice Midwife

## 2016-03-22 DIAGNOSIS — IMO0002 Reserved for concepts with insufficient information to code with codable children: Secondary | ICD-10-CM

## 2016-03-22 DIAGNOSIS — O0932 Supervision of pregnancy with insufficient antenatal care, second trimester: Secondary | ICD-10-CM

## 2016-03-22 DIAGNOSIS — Z1389 Encounter for screening for other disorder: Secondary | ICD-10-CM

## 2016-03-22 DIAGNOSIS — O26899 Other specified pregnancy related conditions, unspecified trimester: Secondary | ICD-10-CM

## 2016-03-22 DIAGNOSIS — Z36 Encounter for antenatal screening of mother: Secondary | ICD-10-CM | POA: Insufficient documentation

## 2016-03-22 DIAGNOSIS — R109 Unspecified abdominal pain: Principal | ICD-10-CM

## 2016-03-22 DIAGNOSIS — Z363 Encounter for antenatal screening for malformations: Secondary | ICD-10-CM

## 2016-03-22 DIAGNOSIS — Z3A20 20 weeks gestation of pregnancy: Secondary | ICD-10-CM

## 2016-03-22 DIAGNOSIS — Z3A22 22 weeks gestation of pregnancy: Secondary | ICD-10-CM | POA: Insufficient documentation

## 2016-03-22 DIAGNOSIS — Z3482 Encounter for supervision of other normal pregnancy, second trimester: Secondary | ICD-10-CM

## 2016-04-13 ENCOUNTER — Ambulatory Visit (INDEPENDENT_AMBULATORY_CARE_PROVIDER_SITE_OTHER): Payer: Self-pay | Admitting: Clinical

## 2016-04-13 ENCOUNTER — Ambulatory Visit (INDEPENDENT_AMBULATORY_CARE_PROVIDER_SITE_OTHER): Payer: Self-pay | Admitting: Family

## 2016-04-13 VITALS — BP 107/58 | HR 69 | Wt 114.0 lb

## 2016-04-13 DIAGNOSIS — Z3482 Encounter for supervision of other normal pregnancy, second trimester: Secondary | ICD-10-CM

## 2016-04-13 DIAGNOSIS — F4321 Adjustment disorder with depressed mood: Secondary | ICD-10-CM

## 2016-04-13 DIAGNOSIS — O0932 Supervision of pregnancy with insufficient antenatal care, second trimester: Secondary | ICD-10-CM

## 2016-04-13 LAB — POCT URINALYSIS DIP (DEVICE)
BILIRUBIN URINE: NEGATIVE
Glucose, UA: NEGATIVE mg/dL
HGB URINE DIPSTICK: NEGATIVE
Ketones, ur: NEGATIVE mg/dL
LEUKOCYTES UA: NEGATIVE
Nitrite: NEGATIVE
Protein, ur: NEGATIVE mg/dL
SPECIFIC GRAVITY, URINE: 1.015 (ref 1.005–1.030)
Urobilinogen, UA: 0.2 mg/dL (ref 0.0–1.0)
pH: 6 (ref 5.0–8.0)

## 2016-04-13 LAB — CBC
HCT: 34.1 % — ABNORMAL LOW (ref 35.0–45.0)
Hemoglobin: 11.5 g/dL — ABNORMAL LOW (ref 11.7–15.5)
MCH: 28.7 pg (ref 27.0–33.0)
MCHC: 33.7 g/dL (ref 32.0–36.0)
MCV: 85 fL (ref 80.0–100.0)
MPV: 12.6 fL — AB (ref 7.5–12.5)
PLATELETS: 173 10*3/uL (ref 140–400)
RBC: 4.01 MIL/uL (ref 3.80–5.10)
RDW: 12.9 % (ref 11.0–15.0)
WBC: 9.9 10*3/uL (ref 3.8–10.8)

## 2016-04-13 NOTE — Progress Notes (Signed)
  ASSESSMENT: Pt currently experiencing Adjustment disorder with depressed mood.Pt needs to f/u with OB. Pt would benefit from psychoeducation and brief therapeutic interventions regarding coping with symptoms of depression.  Stage of Change: contemplative  PLAN: 1. F/U with behavioral health clinician as needed, if symptoms increase 2. Psychiatric Medications: none 3. Behavioral recommendations:   -Continue to take daily walks and listen to favorite music -Remember importance of self-care for wellbeing -Consider reading educational material regarding coping with symptoms of depression, to be prepared if symptoms return and/or increase  SUBJECTIVE: Pt. referred by Donette Larry, CNM,  for symptoms of depression(at last visit) Pt. reports the following symptoms/concerns: Pt states that her feelings "go up and down", but that she is listening to music, taking walks daily, keeping a positive outlook, eating better, and is feeling better today than at office visit one month prior; FOB very supportive, and admits she sometimes forgets to take care of herself. Duration of problem: One month prior Severity: mild   OBJECTIVE: Orientation & Cognition: Oriented x3. Thought processes normal and appropriate to situation. Mood: appropriate Affect: appropriate Appearance: appropriate Risk of harm to self or others: no known risk of harm to self or others Substance use: none Assessments administered: none today (visit today to address symptoms at previous appointment)  Diagnosis: Adjustment disorder with depressed mood CPT Code: F43.21  -------------------------------------------- Other(s) present in the room: FOB  Time spent with patient in exam room: 20  minutes 11:55am to 12:15pm  Depression screen PHQ 2/9 03/10/2016  Decreased Interest 1  Down, Depressed, Hopeless 2  PHQ - 2 Score 3  Altered sleeping 2  Tired, decreased energy 1  Change in appetite 2  Feeling bad or failure about  yourself  1  Trouble concentrating 1  Moving slowly or fidgety/restless 2  Suicidal thoughts 0  PHQ-9 Score 12   GAD 7 : Generalized Anxiety Score 03/10/2016  Nervous, Anxious, on Edge 1  Control/stop worrying 1  Worry too much - different things 2  Trouble relaxing 1  Restless 1  Easily annoyed or irritable 1  Afraid - awful might happen 2  Total GAD 7 Score 9

## 2016-04-13 NOTE — Progress Notes (Signed)
Subjective:  Shelby Serrano is a 20 y.o. [phone] at [redacted]w[redacted]d being seen today for ongoing prenatal care.  She is currently monitored for the following issues for this low-risk pregnancy and has Late prenatal care and Supervision of normal subsequent pregnancy on her problem list.  Patient reports no complaints.  Contractions: Not present.  .  Movement: Present. Denies leaking of fluid.   The following portions of the patient's history were reviewed and updated as appropriate: allergies, current medications, past family history, past medical history, past social history, past surgical history and problem list. Problem list updated.  Objective:   Vitals:   04/13/16 1110  BP: (!) 107/58  Pulse: 69  Weight: 114 lb (51.7 kg)    Fetal Status: Fetal Heart Rate (bpm): 142   Movement: Present     General:  Alert, oriented and cooperative. Patient is in no acute distress.  Skin: Skin is warm and dry. No rash noted.   Cardiovascular: Normal heart rate noted  Respiratory: Normal respiratory effort, no problems with respiration noted  Abdomen: Soft, gravid, appropriate for gestational age. Pain/Pressure: Present     Pelvic:  Cervical exam deferred        Extremities: Normal range of motion.     Mental Status: Normal mood and affect. Normal behavior. Normal judgment and thought content.   Urinalysis:      Assessment and Plan:  Pregnancy: [id number] at [redacted]w[redacted]d  Supervision of normal subsequent pregnancy  - Third trimester labs drawn today CBC, RPR, HIV, GTT 1 hour.  - Rhogam and TDAP given today  -Preterm labor symptoms and general obstetric precautions including but not limited to vaginal bleeding, contractions, leaking of fluid and fetal movement were reviewed in detail with the  patient. -Please refer to After Visit Summary for other counseling recommendations.  Return in about 4 weeks (around 05/11/2016).   Duane Lope, NP

## 2016-04-13 NOTE — Patient Instructions (Signed)
AREA PEDIATRIC/FAMILY PRACTICE PHYSICIANS  Shelby Serrano [address], [address] [place], [place]  [address] Phone - [phone]   Fax - [phone]  Shelby Serrano [address] [place], [place] [address] Phone - [phone]   Fax - [phone]  Shelby Serrano [address] [place], [place]  [address] Phone - [phone]   Fax - [phone]  Shelby Serrano [address] [place], [place]  [address] Phone - [phone]   Fax - [phone]  Shelby Serrano [address] [place], [place]  [address] Phone - [phone]   Fax - [phone]  Shelby Serrano [address] [place], [place]  [address] Phone - [phone]   Fax - [phone]  Shelby Serrano [address] [place], [place]  [address] Phone - [phone]   Fax - [phone]  Shelby Serrano [address], [address] [place], [place]  [address] Phone - [phone]   Fax - [phone]  Shelby Serrano [address] [place], [place]  [address] Phone - [phone]   Fax - [phone] [facility] [address] [place], [place]  [address] Phone - [phone]   Fax - [phone]  [facility] [address] [place], [place]  [address] Phone - [phone]   Fax - [phone]  [facility] [address] [place], [place]  [address] Phone - [phone]   Fax - [phone]  [facility] [address], [address] [place], [place]  [address] Phone - [phone]   Fax - [phone]  SHILPA GOSRANI [address] [place], [place]  [address] Phone - [phone]  [facility] [address] [place], [place]  [address] Phone - [phone]   Fax - [phone]  [facility] [address] [place], [place]  [address] Phone - [phone]   Fax - [phone]  [facility] [address] [place], [place]  [address] Phone - [phone]   Fax - [phone]  [facility] [address] [place], [place]  [address] Phone - [phone]   Fax - [phone]   [facility] [address] [place], [place]  [address] Phone - [phone]   Fax - [phone]  [facility] [address] [place], [place]  [address] Phone - [phone]   Fax - [phone]  [facility] [address] , [place]  [address] Phone - [phone]   Fax - [phone]  [facility] [address] , [place]  [address] Phone - [phone]   Fax - [phone]  [facility] [address] , [place]  [address] Phone - [phone]   Fax - [phone] [facility] [address] [place], [place]  [address] Phone - [phone]   Fax - [phone]  [facility] [address] [place], [place]  [address] Phone - [phone]   Fax - [phone]  [facility] [address] [place], [place]  [address] Phone - [phone]   Fax - [phone]   

## 2016-04-13 NOTE — Progress Notes (Signed)
1 hour gtt due at 12:11

## 2016-04-14 LAB — GLUCOSE TOLERANCE, 1 HOUR (50G) W/O FASTING: Glucose, 1 Hr, gestational: 59 mg/dL — ABNORMAL LOW (ref <140)

## 2016-04-14 LAB — RPR

## 2016-04-14 LAB — HIV ANTIBODY (ROUTINE TESTING W REFLEX): HIV: NONREACTIVE

## 2016-05-04 ENCOUNTER — Encounter (HOSPITAL_COMMUNITY): Payer: Self-pay | Admitting: *Deleted

## 2016-05-04 ENCOUNTER — Emergency Department (HOSPITAL_COMMUNITY)
Admission: EM | Admit: 2016-05-04 | Discharge: 2016-05-04 | Discharge status: Against Medical Advice | Payer: Medicaid Other | Attending: Emergency Medicine | Admitting: Emergency Medicine

## 2016-05-04 DIAGNOSIS — Y929 Unspecified place or not applicable: Secondary | ICD-10-CM | POA: Insufficient documentation

## 2016-05-04 DIAGNOSIS — W19XXXA Unspecified fall, initial encounter: Secondary | ICD-10-CM | POA: Insufficient documentation

## 2016-05-04 DIAGNOSIS — Z3481 Encounter for supervision of other normal pregnancy, first trimester: Secondary | ICD-10-CM

## 2016-05-04 DIAGNOSIS — Y9302 Activity, running: Secondary | ICD-10-CM | POA: Diagnosis not present

## 2016-05-04 DIAGNOSIS — O0930 Supervision of pregnancy with insufficient antenatal care, unspecified trimester: Secondary | ICD-10-CM

## 2016-05-04 DIAGNOSIS — Z87891 Personal history of nicotine dependence: Secondary | ICD-10-CM | POA: Diagnosis not present

## 2016-05-04 DIAGNOSIS — R55 Syncope and collapse: Secondary | ICD-10-CM | POA: Diagnosis not present

## 2016-05-04 DIAGNOSIS — Z3490 Encounter for supervision of normal pregnancy, unspecified, unspecified trimester: Secondary | ICD-10-CM

## 2016-05-04 DIAGNOSIS — O9A213 Injury, poisoning and certain other consequences of external causes complicating pregnancy, third trimester: Secondary | ICD-10-CM | POA: Insufficient documentation

## 2016-05-04 DIAGNOSIS — Y999 Unspecified external cause status: Secondary | ICD-10-CM | POA: Diagnosis not present

## 2016-05-04 DIAGNOSIS — Z79899 Other long term (current) drug therapy: Secondary | ICD-10-CM | POA: Insufficient documentation

## 2016-05-04 DIAGNOSIS — Z3A33 33 weeks gestation of pregnancy: Secondary | ICD-10-CM | POA: Diagnosis not present

## 2016-05-04 LAB — URINALYSIS, ROUTINE W REFLEX MICROSCOPIC
BILIRUBIN URINE: NEGATIVE
Glucose, UA: NEGATIVE mg/dL
HGB URINE DIPSTICK: NEGATIVE
Ketones, ur: NEGATIVE mg/dL
NITRITE: NEGATIVE
PROTEIN: NEGATIVE mg/dL
SPECIFIC GRAVITY, URINE: 1.009 (ref 1.005–1.030)
pH: 7 (ref 5.0–8.0)

## 2016-05-04 LAB — RAPID URINE DRUG SCREEN, HOSP PERFORMED
AMPHETAMINES: NOT DETECTED
Barbiturates: NOT DETECTED
Benzodiazepines: NOT DETECTED
COCAINE: NOT DETECTED
OPIATES: NOT DETECTED
Tetrahydrocannabinol: POSITIVE — AB

## 2016-05-04 LAB — COMPREHENSIVE METABOLIC PANEL
ALK PHOS: 114 U/L (ref 38–126)
ALT: 11 U/L — AB (ref 14–54)
ANION GAP: 6 (ref 5–15)
AST: 19 U/L (ref 15–41)
Albumin: 2.7 g/dL — ABNORMAL LOW (ref 3.5–5.0)
BILIRUBIN TOTAL: 0.6 mg/dL (ref 0.3–1.2)
BUN: 6 mg/dL (ref 6–20)
CALCIUM: 8.6 mg/dL — AB (ref 8.9–10.3)
CO2: 21 mmol/L — ABNORMAL LOW (ref 22–32)
CREATININE: 0.74 mg/dL (ref 0.44–1.00)
Chloride: 108 mmol/L (ref 101–111)
Glucose, Bld: 74 mg/dL (ref 65–99)
Potassium: 3.5 mmol/L (ref 3.5–5.1)
SODIUM: 135 mmol/L (ref 135–145)
TOTAL PROTEIN: 5.6 g/dL — AB (ref 6.5–8.1)

## 2016-05-04 LAB — CBC WITH DIFFERENTIAL/PLATELET
BASOS PCT: 0 %
Basophils Absolute: 0 10*3/uL (ref 0.0–0.1)
EOS ABS: 0.2 10*3/uL (ref 0.0–0.7)
EOS PCT: 2 %
HCT: 30.8 % — ABNORMAL LOW (ref 36.0–46.0)
HEMOGLOBIN: 10.4 g/dL — AB (ref 12.0–15.0)
LYMPHS ABS: 2 10*3/uL (ref 0.7–4.0)
Lymphocytes Relative: 17 %
MCH: 28.4 pg (ref 26.0–34.0)
MCHC: 33.8 g/dL (ref 30.0–36.0)
MCV: 84.2 fL (ref 78.0–100.0)
Monocytes Absolute: 0.7 10*3/uL (ref 0.1–1.0)
Monocytes Relative: 6 %
NEUTROS PCT: 75 %
Neutro Abs: 8.8 10*3/uL — ABNORMAL HIGH (ref 1.7–7.7)
PLATELETS: 170 10*3/uL (ref 150–400)
RBC: 3.66 MIL/uL — AB (ref 3.87–5.11)
RDW: 13.6 % (ref 11.5–15.5)
WBC: 11.8 10*3/uL — AB (ref 4.0–10.5)

## 2016-05-04 LAB — CBG MONITORING, ED: GLUCOSE-CAPILLARY: 71 mg/dL (ref 65–99)

## 2016-05-04 LAB — URINE MICROSCOPIC-ADD ON: RBC / HPF: NONE SEEN RBC/hpf (ref 0–5)

## 2016-05-04 LAB — TROPONIN I

## 2016-05-04 LAB — CK: Total CK: 68 U/L (ref 38–234)

## 2016-05-04 MED ORDER — SODIUM CHLORIDE 0.9 % IV BOLUS (SEPSIS)
1000.0000 mL | Freq: Once | INTRAVENOUS | Status: AC
  Administered 2016-05-04: 1000 mL via INTRAVENOUS

## 2016-05-04 MED ORDER — SODIUM CHLORIDE 0.9 % IV SOLN
1000.0000 mL | INTRAVENOUS | Status: DC
  Administered 2016-05-04: 1000 mL via INTRAVENOUS

## 2016-05-04 MED ORDER — SODIUM CHLORIDE 0.9 % IV SOLN
1000.0000 mL | Freq: Once | INTRAVENOUS | Status: AC
  Administered 2016-05-04: 1000 mL via INTRAVENOUS

## 2016-05-04 NOTE — ED Triage Notes (Signed)
OB rapid response RN on the way

## 2016-05-04 NOTE — ED Triage Notes (Signed)
Pt outside on run and felt hot  Like she was going to pass out. Pt fell to ground on stomach. Unsure if pos LOC. Pt [redacted] weeks pregnant due Nov 12th. No bleeding, no complications with pregnancy at this time. 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 One child at home. Pt alert and oriented, complaints of abd cramping and back cramping pain 6/10. CBG 80, HR 88, BP 108/50.

## 2016-05-04 NOTE — ED Notes (Signed)
Rapid OB RN at bedside, pt placed on monitor

## 2016-05-04 NOTE — ED Notes (Signed)
OB Rapid Response RN at bedside. 

## 2016-05-04 NOTE — Progress Notes (Addendum)
1300 Arrived to evaluate this 20 yo G4P1 @ 28.[redacted] wks GA in with report that she was out for a run, felt faint, and fell on her abdomen.  Possible LOC, pt is unsure.  Abdomen is somewhat tender LUQ.  No bruising or abrasions to abdomen noted. No vaginal bleeding or leaking of fluid.  Frequent fetal movement palpated. Patient is wearing pajama pants and slippers. No bruising or abrasions to head, face, arms.  Bruising left lower leg.  None noted on right.1400  FHR Category I, Occ UC, slight UI. 1440 Dr. Shawnie PonsPratt notified of above.  Advises 2-3 hour of fetal monitoring and if reassuring without regular UC's patient can be OB cleared. 1732 Patient leaving AMA. Refuses further EFM.

## 2016-05-04 NOTE — ED Provider Notes (Signed)
[facility]PT Provider Note   CSN: [id number] Arrival date & time: 05/04/16  1305     History   Chief Complaint Chief Complaint  Patient presents with  . Fall    HPI Shelby Serrano is a 20 y.o. female.  HPI Patient is [redacted] weeks gestation. She reports that she was out running in order to exercise. She states she had been running for about 20 minutes and she thinks she overheated. She reports she started to feel lightheaded and not very good and was trying to get home. She states before she got home she passed out and a bystander called EMS. She is not sure if she hit her head. She does report she has a generalized headache. He also reports the back of her neck hurts. She also states she had some chest discomfort before passing out. She is denying any vaginal bleeding or fluid leak. She reports some cramping in her lower abdomen.  Of additional note: Patient described being outside running for exercise. The patient is not at all diaphoretic on arrival. She is wearing fleece pajama pants and indoor slippers. History reviewed. No pertinent past medical history.  Patient Active Problem List   Diagnosis Date Noted  . Late prenatal care 03/10/2016  . Supervision of normal subsequent pregnancy 03/10/2016    History reviewed. No pertinent surgical history.  OB History    Gravida Para Term Preterm AB Living   4 1 1   2 1    SAB TAB Ectopic Multiple Live Births   2       1       Home Medications    Prior to Admission medications   Medication Sig Start Date End Date Taking? Authorizing Provider  prenatal vitamin w/FE, FA (PRENATAL 1 + 1) 27-1 MG TABS tablet Take 1 tablet by mouth daily. 03/02/16   Misty StanleyLisa A Leftwich-Kirby, CNM  promethazine (PHENERGAN) 25 MG tablet Take 0.5-1 tablets (12.5-25 mg total) by mouth every 6 (six) hours as needed for nausea or vomiting. Patient not taking: Reported on 04/13/2016 03/02/16   Hurshel PartyLisa A Leftwich-Kirby, CNM    Family History Family  History  Problem Relation Age of Onset  . Diabetes Mother   . Hypertension Mother     Social History Social History  Substance Use Topics  . Smoking status: Former Smoker    Types: Cigarettes    Quit date: 02/08/2016  . Smokeless tobacco: Never Used  . Alcohol use No     Allergies   Review of patient's allergies indicates no known allergies.   Review of Systems Review of Systems 10 Systems reviewed and are negative for acute change except as noted in the HPI.   Physical Exam Updated Vital Signs BP 132/86   Pulse 87   Temp 98 F (36.7 C) (Oral)   Resp 15   Ht 5\' 1"  (1.549 m)   Wt 114 lb (51.7 kg)   LMP 09/21/2015   SpO2 97%   BMI 21.54 kg/m   Physical Exam  Constitutional: She appears well-developed and well-nourished. No distress.  HENT:  Head: Normocephalic and atraumatic.  Right Ear: External ear normal.  Left Ear: External ear normal.  Nose: Nose normal.  Mouth/Throat: Oropharynx is clear and moist.  Objectively I cannot identify any trauma to the head. Patient is however wincing and endorsing severe pain to palpation along her forehead and her scalp.  Eyes: Conjunctivae are normal.  Neck: Neck supple.  Cardiovascular: Normal rate and regular rhythm.  No murmur heard. Pulmonary/Chest: Effort normal and breath sounds normal. No respiratory distress.  Abdominal: Soft. There is no tenderness.  Gravid abdomen. No contusions or abrasions. Soft upper abdomen. Nontender.  Musculoskeletal: Normal range of motion. She exhibits no edema, tenderness or deformity.  No evidence of any contusions or abrasions. All range of motion of all extremities.  Neurological: She is alert. No cranial nerve deficit. She exhibits normal muscle tone. Coordination normal.  Skin: Skin is warm and dry.  Psychiatric: She has a normal mood and affect.  Nursing note and vitals reviewed.    ED Treatments / Results  Labs (all labs ordered are listed, but only abnormal results are  displayed) Labs Reviewed  COMPREHENSIVE METABOLIC PANEL - Abnormal; Notable for the following:       Result Value   CO2 21 (*)    Calcium 8.6 (*)    Total Protein 5.6 (*)    Albumin 2.7 (*)    ALT 11 (*)    All other components within normal limits  CBC WITH DIFFERENTIAL/PLATELET - Abnormal; Notable for the following:    WBC 11.8 (*)    RBC 3.66 (*)    Hemoglobin 10.4 (*)    HCT 30.8 (*)    Neutro Abs 8.8 (*)    All other components within normal limits  URINALYSIS, ROUTINE W REFLEX MICROSCOPIC (NOT AT [facility]) - Abnormal; Notable for the following:    Leukocytes, UA MODERATE (*)    All other components within normal limits  URINE RAPID DRUG SCREEN, HOSP PERFORMED - Abnormal; Notable for the following:    Tetrahydrocannabinol POSITIVE (*)    All other components within normal limits  URINE MICROSCOPIC-ADD ON - Abnormal; Notable for the following:    Squamous Epithelial / LPF 0-5 (*)    Bacteria, UA RARE (*)    All other components within normal limits  TROPONIN I  CK  CBG MONITORING, ED    EKG  EKG Interpretation None       Radiology No results found.  Procedures Procedures (including critical care time)  Medications Ordered in ED Medications  0.9 %  sodium chloride infusion (0 mLs Intravenous Stopped 05/04/16 1451)    Followed by  0.9 %  sodium chloride infusion (0 mLs Intravenous Stopped 05/04/16 1544)  sodium chloride 0.9 % bolus 1,000 mL (0 mLs Intravenous Stopped 05/04/16 1450)     Initial Impression / Assessment and Plan / ED Course  I have reviewed the triage vital signs and the nursing notes.  Pertinent labs & imaging results that were available during my care of the patient were reviewed by me and considered in my medical decision making (see chart for details).  Clinical Course   Patient determine she wished to leave AMA. She did not want to complete her fetal monitoring as recommended by OB. The patient's nurse reports that she had become very upset  and agitated when she found out that her family members had left. I went back and spoke with the patient and she states she just wants to get home and she doesn't want to be here anymore. She states she is going to come back if she is having any problems or doesn't feel well.   Final Clinical Impressions(s) / ED Diagnoses   Final diagnoses:  Syncope, unspecified syncope type  Pregnancy  Patient arises stable vital signs, no fever and no diaphoresis. She describes being out running and suspect that she had had heat exhaustion. Her general appearance did not  suggest that she had recently been out in the heat. Her mental status is clear and physical examination normal. A she did have fetal monitoring by OB with no evidence of complications. Patient however did determine she was going to leave prior to the recommended monitoring thus she was signed out AMA. The local examination and diagnostic lab work did not suggest traumatic injury or electrolyte instability. No objective findings to suggest heat exhaustion.  New Prescriptions Discharge Medication List as of 05/04/2016  3:43 PM       Arby Barrette, MD 05/04/16 1555

## 2016-05-04 NOTE — ED Notes (Signed)
Removed C collar per MD verbal order. Pt denies neck pain.

## 2016-05-04 NOTE — ED Notes (Signed)
Pt asked that I see if her family was in the waiting room, I instructed patient that they were not out there but that the secretary in the waiting room would be looking out for them and let me know as soon as they got here, patient got very upset and said she had to leave and she was hungry, I offered the patient for me to speak with the doctor about getting her something to drink and something to eat, she then refused and said she was leaving, er md at bedside talking with patient, patient decided to sign out ama

## 2016-05-18 ENCOUNTER — Encounter: Payer: Self-pay | Admitting: Family Medicine

## 2016-05-18 ENCOUNTER — Ambulatory Visit (INDEPENDENT_AMBULATORY_CARE_PROVIDER_SITE_OTHER): Payer: Medicaid Other | Admitting: Family Medicine

## 2016-05-18 VITALS — BP 105/68 | HR 75 | Wt 120.7 lb

## 2016-05-18 DIAGNOSIS — Z23 Encounter for immunization: Secondary | ICD-10-CM | POA: Diagnosis not present

## 2016-05-18 DIAGNOSIS — O0933 Supervision of pregnancy with insufficient antenatal care, third trimester: Secondary | ICD-10-CM

## 2016-05-18 DIAGNOSIS — Z3483 Encounter for supervision of other normal pregnancy, third trimester: Secondary | ICD-10-CM

## 2016-05-18 LAB — POCT URINALYSIS DIP (DEVICE)
BILIRUBIN URINE: NEGATIVE
GLUCOSE, UA: NEGATIVE mg/dL
HGB URINE DIPSTICK: NEGATIVE
KETONES UR: NEGATIVE mg/dL
Leukocytes, UA: NEGATIVE
Nitrite: NEGATIVE
PH: 6 (ref 5.0–8.0)
PROTEIN: NEGATIVE mg/dL
SPECIFIC GRAVITY, URINE: 1.015 (ref 1.005–1.030)
Urobilinogen, UA: 0.2 mg/dL (ref 0.0–1.0)

## 2016-05-18 NOTE — Progress Notes (Signed)
Subjective:  Shelby Serrano is a 20 y.o. [phone] at 631w3d  being seen today for ongoing prenatal care.  She is currently monitored for the following issues for this low-risk pregnancy and has Late prenatal care and Supervision of normal subsequent pregnancy on her problem list.  Patient reports no complaints.  Contractions: Not present. Vag. Bleeding: None.  Movement: Present. Denies leaking of fluid.   The following portions of the patient's history were reviewed and updated as appropriate: allergies, current medications, past family history, past medical history, past social history, past surgical history and problem list. Problem list updated.  Objective:   Vitals:   05/18/16 0905  BP: 105/68  Pulse: 75  Weight: 120 lb 11.2 oz (54.7 kg)    Fetal Status: Fetal Heart Rate (bpm): 131   Movement: Present     General:  Alert, oriented and cooperative. Patient is in no acute distress.  Skin: Skin is warm and dry. No rash noted.   Cardiovascular: Normal heart rate noted  Respiratory: Normal respiratory effort, no problems with respiration noted  Abdomen: Soft, gravid, appropriate for gestational age. Pain/Pressure: Absent     Pelvic:  Cervical exam deferred        Extremities: Normal range of motion.  Edema: None  Mental Status: Normal mood and affect. Normal behavior. Normal judgment and thought content.   Urinalysis: Urine Protein: Negative Urine Glucose: Negative  Assessment and Plan:  Pregnancy: G4P1021 at 261w3d  1. Encounter for supervision of other normal pregnancy in third trimester - Discrepancy for EDD on two ultrasound reports, clarification from MFM states to use 03/22/16 US, as the 5/14 US is inaccurate dating/measurements.  EDC is 07/24/16, corrected in chart.  - Flu shot today. Rhogam and TDaP given at last visit 04/13/16.  2. Late prenatal care, third trimester   Preterm labor symptoms and general obstetric precautions including but not limited to vaginal  bleeding, contractions, leaking of fluid and fetal movement were reviewed in detail with the patient. Please refer to After Visit Summary for other counseling recommendations.  Return in about 2 weeks (around 06/01/2016) for Routine OB visit.   Cleda ClarksElizabeth W. Lovelyn Sheeran, DO  OB Fellow Center [facility], [facility]

## 2016-05-18 NOTE — Addendum Note (Signed)
Addended by: Garret ReddishBARNES, Camry Robello M on: 05/18/2016 10:27 AM   Modules accepted: Orders

## 2016-05-18 NOTE — Patient Instructions (Signed)
Third Trimester of Pregnancy The third trimester is from week 29 through week 42, months 7 through 9. This trimester is when your unborn baby (fetus) is growing very fast. At the end of the ninth month, the unborn baby is about 20 inches in length. It weighs about 6-10 pounds.  HOME CARE   Avoid all smoking, herbs, and alcohol. Avoid drugs not approved by your doctor.  Do not use any tobacco products, including cigarettes, chewing tobacco, and electronic cigarettes. If you need help quitting, ask your doctor. You may get counseling or other support to help you quit.  Only take medicine as told by your doctor. Some medicines are safe and some are not during pregnancy.  Exercise only as told by your doctor. Stop exercising if you start having cramps.  Eat regular, healthy meals.  Wear a good support bra if your breasts are tender.  Do not use hot tubs, steam rooms, or saunas.  Wear your seat belt when driving.  Avoid raw meat, uncooked cheese, and liter boxes and soil used by cats.  Take your prenatal vitamins.  Take 1500-2000 milligrams of calcium daily starting at the 20th week of pregnancy until you deliver your baby.  Try taking medicine that helps you poop (stool softener) as needed, and if your doctor approves. Eat more fiber by eating fresh fruit, vegetables, and whole grains. Drink enough fluids to keep your pee (urine) clear or pale yellow.  Take warm water baths (sitz baths) to soothe pain or discomfort caused by hemorrhoids. Use hemorrhoid cream if your doctor approves.  If you have puffy, bulging veins (varicose veins), wear support hose. Raise (elevate) your feet for 15 minutes, 3-4 times a day. Limit salt in your diet.  Avoid heavy lifting, wear low heels, and sit up straight.  Rest with your legs raised if you have leg cramps or low back pain.  Visit your dentist if you have not gone during your pregnancy. Use a soft toothbrush to brush your teeth. Be gentle when you  floss.  You can have sex (intercourse) unless your doctor tells you not to.  Do not travel far distances unless you must. Only do so with your doctor's approval.  Take prenatal classes.  Practice driving to the hospital.  Pack your hospital bag.  Prepare the baby's room.  Go to your doctor visits. GET HELP IF:  You are not sure if you are in labor or if your water has broken.  You are dizzy.  You have mild cramps or pressure in your lower belly (abdominal).  You have a nagging pain in your belly area.  You continue to feel sick to your stomach (nauseous), throw up (vomit), or have watery poop (diarrhea).  You have bad smelling fluid coming from your vagina.  You have pain with peeing (urination). GET HELP RIGHT AWAY IF:   You have a fever.  You are leaking fluid from your vagina.  You are spotting or bleeding from your vagina.  You have severe belly cramping or pain.  You lose or gain weight rapidly.  You have trouble catching your breath and have chest pain.  You notice sudden or extreme puffiness (swelling) of your face, hands, ankles, feet, or legs.  You have not felt the baby move in over an hour.  You have severe headaches that do not go away with medicine.  You have vision changes.   This information is not intended to replace advice given to you by your health care   provider. Make sure you discuss any questions you have with your health care provider.   Document Released: 11/24/2009 Document Revised: 09/20/2014 Document Reviewed: 10/31/2012 Elsevier Interactive Patient Education 2016 Elsevier Inc.   Influenza (Flu) Vaccine (Inactivated or Recombinant):  1. Why get vaccinated? Influenza ("flu") is a contagious disease that spreads around the [place] every year, usually between October and May. Flu is caused by influenza viruses, and is spread mainly by coughing, sneezing, and close contact. Anyone can get flu. Flu strikes suddenly and can last  several days. Symptoms vary by age, but can include:  fever/chills  sore throat  muscle aches  fatigue  cough  headache  runny or stuffy nose Flu can also lead to pneumonia and blood infections, and cause diarrhea and seizures in children. If you have a medical condition, such as heart or lung disease, flu can make it worse. Flu is more dangerous for some people. Infants and young children, people 21 years of age and older, pregnant women, and people with certain health conditions or a weakened immune system are at greatest risk. Each year thousands of people in the [place] die from flu, and many more are hospitalized. Flu vaccine can:  keep you from getting flu,  make flu less severe if you do get it, and  keep you from spreading flu to your family and other people. 2. Inactivated and recombinant flu vaccines A dose of flu vaccine is recommended every flu season. Children 6 months through 89 years of age may need two doses during the same flu season. Everyone else needs only one dose each flu season. Some inactivated flu vaccines contain a very small amount of a mercury-based preservative called thimerosal. Studies have not shown thimerosal in vaccines to be harmful, but flu vaccines that do not contain thimerosal are available. There is no live flu virus in flu shots. They cannot cause the flu. There are many flu viruses, and they are always changing. Each year a new flu vaccine is made to protect against three or four viruses that are likely to cause disease in the upcoming flu season. But even when the vaccine doesn't exactly match these viruses, it may still provide some protection. Flu vaccine cannot prevent:  flu that is caused by a virus not covered by the vaccine, or  illnesses that look like flu but are not. It takes about 2 weeks for protection to develop after vaccination, and protection lasts through the flu season. 3. Some people should not get this  vaccine Tell the person who is giving you the vaccine:  If you have any severe, life-threatening allergies. If you ever had a life-threatening allergic reaction after a dose of flu vaccine, or have a severe allergy to any part of this vaccine, you may be advised not to get vaccinated. Most, but not all, types of flu vaccine contain a small amount of egg protein.  If you ever had Guillain-Barre Syndrome (also called GBS). Some people with a history of GBS should not get this vaccine. This should be discussed with your doctor.  If you are not feeling well. It is usually okay to get flu vaccine when you have a mild illness, but you might be asked to come back when you feel better. 4. Risks of a vaccine reaction With any medicine, including vaccines, there is a chance of reactions. These are usually mild and go away on their own, but serious reactions are also possible. Most people who get a flu shot  do not have any problems with it. Minor problems following a flu shot include:  soreness, redness, or swelling where the shot was given  hoarseness  sore, red or itchy eyes  cough  fever  aches  headache  itching  fatigue If these problems occur, they usually begin soon after the shot and last 1 or 2 days. More serious problems following a flu shot can include the following:  There may be a small increased risk of Guillain-Barre Syndrome (GBS) after inactivated flu vaccine. This risk has been estimated at 1 or 2 additional cases per million people vaccinated. This is much lower than the risk of severe complications from flu, which can be prevented by flu vaccine.  Young children who get the flu shot along with pneumococcal vaccine (PCV13) and/or DTaP vaccine at the same time might be slightly more likely to have a seizure caused by fever. Ask your doctor for more information. Tell your doctor if a child who is getting flu vaccine has ever had a seizure. Problems that could happen after  any injected vaccine:  People sometimes faint after a medical procedure, including vaccination. Sitting or lying down for about 15 minutes can help prevent fainting, and injuries caused by a fall. Tell your doctor if you feel dizzy, or have vision changes or ringing in the ears.  Some people get severe pain in the shoulder and have difficulty moving the arm where a shot was given. This happens very rarely.  Any medication can cause a severe allergic reaction. Such reactions from a vaccine are very rare, estimated at about 1 in a million doses, and would happen within a few minutes to a few hours after the vaccination. As with any medicine, there is a very remote chance of a vaccine causing a serious injury or death. The safety of vaccines is always being monitored. For more information, visit: http://floyd.org/www.cdc.gov/vaccinesafety/ 5. What if there is a serious reaction? What should I look for?  Look for anything that concerns you, such as signs of a severe allergic reaction, very high fever, or unusual behavior. Signs of a severe allergic reaction can include hives, swelling of the face and throat, difficulty breathing, a fast heartbeat, dizziness, and weakness. These would start a few minutes to a few hours after the vaccination. What should I do?  If you think it is a severe allergic reaction or other emergency that can't wait, call 9-1-1 and get the person to the nearest hospital. Otherwise, call your doctor.  Reactions should be reported to the Vaccine Adverse Event Reporting System (VAERS). Your doctor should file this report, or you can do it yourself through the VAERS web site at www.vaers.LAgents.nohhs.gov, or by calling 1-785 096 1506. VAERS does not give medical advice. 6. The National Vaccine Injury Compensation Program The [employer] Vaccine Injury Compensation Program (VICP) is a federal program that was created to compensate people who may have been injured by certain vaccines. Persons who believe they  may have been injured by a vaccine can learn about the program and about filing a claim by calling 1-365-481-4963 or visiting the VICP website at SpiritualWord.atwww.hrsa.gov/vaccinecompensation. There is a time limit to file a claim for compensation. 7. How can I learn more?  Ask your healthcare provider. He or she can give you the vaccine package insert or suggest other sources of information.  Call your local or state health department.  Contact the Centers for Disease Control and Prevention (CDC):  Call (920) 311-17961-681-823-9813 (1-800-CDC-INFO) or  Visit CDC's website at  BiotechRoom.com.cywww.cdc.gov/flu Vaccine Information Statement Inactivated Influenza Vaccine (04/19/2014)   This information is not intended to replace advice given to you by your health care provider. Make sure you discuss any questions you have with your health care provider.   Document Released: 06/24/2006 Document Revised: 09/20/2014 Document Reviewed: 04/22/2014 Elsevier Interactive Patient Education [employer]2016 Elsevier Inc.

## 2016-06-01 ENCOUNTER — Ambulatory Visit (INDEPENDENT_AMBULATORY_CARE_PROVIDER_SITE_OTHER): Payer: Medicaid Other | Admitting: Certified Nurse Midwife

## 2016-06-01 VITALS — BP 99/57 | HR 74 | Wt 123.1 lb

## 2016-06-01 DIAGNOSIS — Z23 Encounter for immunization: Secondary | ICD-10-CM

## 2016-06-01 DIAGNOSIS — Z3483 Encounter for supervision of other normal pregnancy, third trimester: Secondary | ICD-10-CM | POA: Diagnosis present

## 2016-06-01 LAB — POCT URINALYSIS DIP (DEVICE)
Bilirubin Urine: NEGATIVE
GLUCOSE, UA: NEGATIVE mg/dL
Ketones, ur: NEGATIVE mg/dL
Nitrite: NEGATIVE
PH: 6 (ref 5.0–8.0)
PROTEIN: NEGATIVE mg/dL
Specific Gravity, Urine: 1.015 (ref 1.005–1.030)
UROBILINOGEN UA: 0.2 mg/dL (ref 0.0–1.0)

## 2016-06-01 MED ORDER — TETANUS-DIPHTH-ACELL PERTUSSIS 5-2.5-18.5 LF-MCG/0.5 IM SUSP
0.5000 mL | Freq: Once | INTRAMUSCULAR | Status: AC
  Administered 2016-06-01: 0.5 mL via INTRAMUSCULAR

## 2016-06-01 NOTE — Progress Notes (Signed)
tdap vaccine today  Pt declines seeing the SW for abnormal PHQ-9 and GAD score.

## 2016-06-01 NOTE — Progress Notes (Signed)
Subjective:  Shelby Serrano is a 20 y.o. [phone] at 6649w3d being seen today for ongoing prenatal care.  She is currently monitored for the following issues for this low-risk pregnancy and has Late prenatal care and Supervision of normal subsequent pregnancy on her problem list.  Patient reports had intermittent ctx yesterday, lasted for 1 hour then subsided.  Contractions: Irregular. Vag. Bleeding: None.  Movement: Present. Denies leaking of fluid.   The following portions of the patient's history were reviewed and updated as appropriate: allergies, current medications, past family history, past medical history, past social history, past surgical history and problem list. Problem list updated.  Objective:   Vitals:   06/01/16 1034  BP: (!) 99/57  Pulse: 74  Weight: 123 lb 1.6 oz (55.8 kg)    Fetal Status: Fetal Heart Rate (bpm): 142   Movement: Present     General:  Alert, oriented and cooperative. Patient is in no acute distress.  Skin: Skin is warm and dry. No rash noted.   Cardiovascular: Normal heart rate noted  Respiratory: Normal respiratory effort, no problems with respiration noted  Abdomen: Soft, gravid, appropriate for gestational age. Pain/Pressure: Present     Pelvic: Vag. Bleeding: None     Cervical exam deferred        Extremities: Normal range of motion.  Edema: None  Mental Status: Normal mood and affect. Normal behavior. Normal judgment and thought content.   Urinalysis: Urine Protein: Trace Urine Glucose: Negative  Assessment and Plan:  Pregnancy: G4P1021 at 5249w3d  1. Encounter for supervision of other normal pregnancy in third trimester -recommend good hydration-6 bottles h2o per day -warm bath , Tylenol prn pain -PTL precautions discussed in detail  Preterm labor symptoms and general obstetric precautions including but not limited to vaginal bleeding, contractions, leaking of fluid and fetal movement were reviewed in detail with the patient. Please  refer to After Visit Summary for other counseling recommendations.  Return in about 2 weeks (around 06/15/2016).   Donette LarryMelanie Aowyn Rozeboom, CNM

## 2016-06-21 ENCOUNTER — Encounter: Payer: Self-pay | Admitting: Family Medicine

## 2016-06-21 ENCOUNTER — Ambulatory Visit (INDEPENDENT_AMBULATORY_CARE_PROVIDER_SITE_OTHER): Payer: Medicaid Other | Admitting: Obstetrics and Gynecology

## 2016-06-21 VITALS — BP 110/64 | HR 79 | Wt 122.4 lb

## 2016-06-21 DIAGNOSIS — Z113 Encounter for screening for infections with a predominantly sexual mode of transmission: Secondary | ICD-10-CM

## 2016-06-21 DIAGNOSIS — Z3483 Encounter for supervision of other normal pregnancy, third trimester: Secondary | ICD-10-CM

## 2016-06-21 DIAGNOSIS — Z124 Encounter for screening for malignant neoplasm of cervix: Secondary | ICD-10-CM

## 2016-06-21 DIAGNOSIS — O26843 Uterine size-date discrepancy, third trimester: Secondary | ICD-10-CM

## 2016-06-21 LAB — OB RESULTS CONSOLE GBS: STREP GROUP B AG: NEGATIVE

## 2016-06-21 NOTE — Progress Notes (Signed)
Prenatal Visit Note Date: 06/21/2016 [facility]  Subjective:  Shelby Serrano is a 20 y.o. [phone] at 4329w2d being seen today for ongoing prenatal care.  She is currently monitored for the following issues for this high-risk pregnancy and has Late prenatal care and Supervision of normal subsequent pregnancy on her problem list.  Patient reports no complaints.   Contractions: Irregular. Vag. Bleeding: None.  Movement: Present. Denies leaking of fluid.   The following portions of the patient's history were reviewed and updated as appropriate: allergies, current medications, past family history, past medical history, past social history, past surgical history and problem list. Problem list updated.  Objective:   Vitals:   06/21/16 0753  BP: 110/64  Pulse: 79  Weight: 122 lb 6.4 oz (55.5 kg)    Fetal Status: Fetal Heart Rate (bpm): 135 Fundal Height: 31 cm Movement: Present  Presentation: Vertex  General:  Alert, oriented and cooperative. Patient is in no acute distress.  Skin: Skin is warm and dry. No rash noted.   Cardiovascular: Normal heart rate noted  Respiratory: Normal respiratory effort, no problems with respiration noted  Abdomen: Soft, gravid, appropriate for gestational age. Pain/Pressure: Present     Pelvic:  Cervical exam deferred        Extremities: Normal range of motion.  Edema: None  Mental Status: Normal mood and affect. Normal behavior. Normal judgment and thought content.   Urinalysis:      Assessment and Plan:  Pregnancy: G4P1021 at 529w2d  1. Encounter for supervision of other normal pregnancy in third trimester Routine care - Culture, beta strep (group b only) - GC[facility]Chlamydia probe amp [facility]  2. Fundal height low for dates in third trimester Hopefully constitutional given patient's size but will add on for NST today, [facility] instructions given and will schedule next available growth u/s - US MFM OB FOLLOW  UP; Future  Preterm labor symptoms and general obstetric precautions including but not limited to vaginal bleeding, contractions, leaking of fluid and fetal movement were reviewed in detail with the patient. Please refer to After Visit Summary for other counseling recommendations.  7-10d rob   [place] Bingharlie Fenris Cauble, MD

## 2016-06-21 NOTE — Patient Instructions (Signed)
Fetal Movement Counts  Performing a fetal movement count is highly recommended in high-risk pregnancies, but it is good for every pregnant woman to do. Your health care provider may ask you to start counting fetal movements at 28 weeks of the pregnancy. Fetal movements often increase:  After eating a full meal.  After physical activity.  After eating or drinking something sweet or cold.  At rest. Pay attention to when you feel the baby is most active. This will help you notice a pattern of your baby's sleep and wake cycles and what factors contribute to an increase in fetal movement. It is important to perform a fetal movement count at the same time each day when your baby is normally most active.  HOW TO COUNT FETAL MOVEMENTS 1. Find a quiet and comfortable area to sit or lie down on your left side. Lying on your left side provides the best blood and oxygen circulation to your baby. Also, eat something and drink something to make sure you aren't dehydrated or your blood sugar isn't low 2. Start counting kicks, flutters, swishes, rolls, or jabs in a 2-hour period. You should feel at least 10 movements within 2 hours. 3. If you do not feel 10 movements in 2 hours call the clinic or come to the hospital for evaluation  SEEK MEDICAL CARE IF:  You feel less than 10 counts in 2 hours  There is no movement in over an hour.  The pattern is changing or taking longer each day to reach 10 counts in 2 hours.  You feel the baby is not moving as he or she usually does.   This information is not intended to replace advice given to you by your health care provider. Make sure you discuss any questions you have with your health care provider.   Document Released: 09/29/2006 Document Revised: 09/20/2014 Document Reviewed: 06/26/2012 Elsevier Interactive Patient Education [employer]2016 Elsevier Inc.

## 2016-06-21 NOTE — Addendum Note (Signed)
Addended by: Jill SideAY, Lani Havlik L on: 06/21/2016 09:18 AM   Modules accepted: Orders

## 2016-06-21 NOTE — Progress Notes (Signed)
Cultures today 

## 2016-06-22 LAB — GC/CHLAMYDIA PROBE AMP (~~LOC~~) NOT AT ARMC
CHLAMYDIA, DNA PROBE: NEGATIVE
NEISSERIA GONORRHEA: NEGATIVE

## 2016-06-23 LAB — CULTURE, BETA STREP (GROUP B ONLY)

## 2016-07-01 ENCOUNTER — Ambulatory Visit (HOSPITAL_COMMUNITY)
Admission: RE | Admit: 2016-07-01 | Discharge: 2016-07-01 | Disposition: A | Discharge status: Home / Self Care | Payer: Medicaid Other | Source: Ambulatory Visit | Attending: Obstetrics and Gynecology | Admitting: Obstetrics and Gynecology

## 2016-07-01 ENCOUNTER — Ambulatory Visit (INDEPENDENT_AMBULATORY_CARE_PROVIDER_SITE_OTHER): Payer: Self-pay | Admitting: Family Medicine

## 2016-07-01 ENCOUNTER — Inpatient Hospital Stay (HOSPITAL_COMMUNITY)
Admission: AD | Admit: 2016-07-01 | Discharge: 2016-07-02 | Disposition: A | Discharge status: Home / Self Care | Payer: Medicaid Other | Source: Ambulatory Visit | Attending: Obstetrics & Gynecology | Admitting: Obstetrics & Gynecology

## 2016-07-01 ENCOUNTER — Other Ambulatory Visit: Payer: Self-pay | Admitting: Obstetrics and Gynecology

## 2016-07-01 ENCOUNTER — Encounter (HOSPITAL_COMMUNITY): Payer: Self-pay | Admitting: *Deleted

## 2016-07-01 ENCOUNTER — Encounter (HOSPITAL_COMMUNITY): Payer: Self-pay

## 2016-07-01 VITALS — BP 112/57 | HR 62 | Wt 124.9 lb

## 2016-07-01 DIAGNOSIS — Z3A36 36 weeks gestation of pregnancy: Secondary | ICD-10-CM | POA: Insufficient documentation

## 2016-07-01 DIAGNOSIS — M9903 Segmental and somatic dysfunction of lumbar region: Secondary | ICD-10-CM

## 2016-07-01 DIAGNOSIS — Z87891 Personal history of nicotine dependence: Secondary | ICD-10-CM | POA: Insufficient documentation

## 2016-07-01 DIAGNOSIS — O26843 Uterine size-date discrepancy, third trimester: Secondary | ICD-10-CM

## 2016-07-01 DIAGNOSIS — Z0371 Encounter for suspected problem with amniotic cavity and membrane ruled out: Secondary | ICD-10-CM

## 2016-07-01 DIAGNOSIS — O26893 Other specified pregnancy related conditions, third trimester: Secondary | ICD-10-CM | POA: Insufficient documentation

## 2016-07-01 DIAGNOSIS — Z3483 Encounter for supervision of other normal pregnancy, third trimester: Secondary | ICD-10-CM

## 2016-07-01 DIAGNOSIS — O0930 Supervision of pregnancy with insufficient antenatal care, unspecified trimester: Secondary | ICD-10-CM

## 2016-07-01 DIAGNOSIS — O365990 Maternal care for other known or suspected poor fetal growth, unspecified trimester, not applicable or unspecified: Secondary | ICD-10-CM

## 2016-07-01 DIAGNOSIS — M9905 Segmental and somatic dysfunction of pelvic region: Secondary | ICD-10-CM

## 2016-07-01 DIAGNOSIS — M9904 Segmental and somatic dysfunction of sacral region: Secondary | ICD-10-CM

## 2016-07-01 NOTE — Progress Notes (Signed)
   PRENATAL VISIT NOTE  Subjective:  Shelby Serrano is a 20 y.o. [phone] at 6163w5d being seen today for ongoing prenatal care.  She is currently monitored for the following issues for this low-risk pregnancy and has Late prenatal care and Supervision of normal subsequent pregnancy on her problem list.  Patient reports backache with pain in legs bilaterally. Contractions: Irregular. Vag. Bleeding: None.  Movement: Present. Denies leaking of fluid.   The following portions of the patient's history were reviewed and updated as appropriate: allergies, current medications, past family history, past medical history, past social history, past surgical history and problem list. Problem list updated.  Objective:   Vitals:   07/01/16 1259  BP: (!) 112/57  Pulse: 62  Weight: 124 lb 14.4 oz (56.7 kg)    Fetal Status: Fetal Heart Rate (bpm): 136   Movement: Present     General:  Alert, oriented and cooperative. Patient is in no acute distress.  Skin: Skin is warm and dry. No rash noted.   Cardiovascular: Normal heart rate noted  Respiratory: Normal respiratory effort, no problems with respiration noted  Abdomen: Soft, gravid, appropriate for gestational age. Pain/Pressure: Present     Pelvic:  Cervical exam deferred        MSK: Restriction in sacrum, pelvis, lumbar areas.  OSE: L/L torsion, L5 FSRR, right ant innom.  Extremities: Normal range of motion.  Edema: None  Mental Status: Normal mood and affect. Normal behavior. Normal judgment and thought content.   Assessment and Plan:  Pregnancy: G4P1021 at 5663w5d  1. Encounter for supervision of other normal pregnancy in third trimester FHT normal.   2. Poor fetal growth affecting management of mother, antepartum, single or unspecified fetus Discussed pt with Dr Sherrie Georgeecker - feels that baby likely [facility]GA, [facility] % vs 3rd % is minute. Recommended twice weekly testing with AFI. [facility]  3. Somatic  dysfunction of lumbar region 4. Somatic dysfunction of pelvis region 5. Somatic dysfunction of sacral region OMT done - HVLA used. Pt tolerated well.   Term labor symptoms and general obstetric precautions including but not limited to vaginal bleeding, contractions, leaking of fluid and fetal movement were reviewed in detail with the patient. Please refer to After Visit Summary for other counseling recommendations.  Return in about 1 week (around 07/08/2016) for OB f/u, NST.  Levie HeritageJacob J Brittnae Aschenbrenner, DO

## 2016-07-01 NOTE — MAU Note (Signed)
Pt reports ? Leaking fluid since 2050

## 2016-07-02 DIAGNOSIS — Z0371 Encounter for suspected problem with amniotic cavity and membrane ruled out: Secondary | ICD-10-CM | POA: Diagnosis not present

## 2016-07-02 DIAGNOSIS — Z3A36 36 weeks gestation of pregnancy: Secondary | ICD-10-CM | POA: Diagnosis not present

## 2016-07-02 DIAGNOSIS — Z87891 Personal history of nicotine dependence: Secondary | ICD-10-CM | POA: Diagnosis not present

## 2016-07-02 DIAGNOSIS — O26893 Other specified pregnancy related conditions, third trimester: Secondary | ICD-10-CM | POA: Diagnosis present

## 2016-07-02 NOTE — MAU Provider Note (Signed)
None     Chief Complaint:  No chief complaint on file.   Shelby Serrano is  20 y.o. [id number] at 6339w6d presents complaining of No chief complaint on file. .  She states she had some "water run down my leg when I was getting into the bathtub".  Came right over, no leaking since.  Denies contractions/bleeding.  Obstetrical/Gynecological History: OB History    Gravida Para Term Preterm AB Living   4 1 1   2 1    SAB TAB Ectopic Multiple Live Births   2       1     Past Medical History: History reviewed. No pertinent past medical history.  Past Surgical History: History reviewed. No pertinent surgical history.  Family History: Family History  Problem Relation Age of Onset  . Diabetes Mother   . Hypertension Mother     Social History: Social History  Substance Use Topics  . Smoking status: Former Smoker    Types: Cigarettes    Quit date: 02/08/2016  . Smokeless tobacco: Never Used  . Alcohol use No    Allergies: No Known Allergies  Meds:  Prescriptions Prior to Admission  Medication Sig Dispense Refill Last Dose  . acetaminophen (TYLENOL) 325 MG tablet Take 650 mg by mouth every 6 (six) hours as needed.   Past Week at Unknown time  . prenatal vitamin w/FE, FA (PRENATAL 1 + 1) 27-1 MG TABS tablet Take 1 tablet by mouth daily. 30 each 10 06/30/2016 at Unknown time  . promethazine (PHENERGAN) 25 MG tablet Take 0.5-1 tablets (12.5-25 mg total) by mouth every 6 (six) hours as needed for nausea or vomiting. (Patient not taking: Reported on 07/01/2016) 30 tablet 2 Not Taking    Review of Systems   Constitutional: Negative for fever and chills Eyes: Negative for visual disturbances Respiratory: Negative for shortness of breath, dyspnea Cardiovascular: Negative for chest pain or palpitations  Gastrointestinal: Negative for vomiting, diarrhea and constipation Genitourinary: Negative for dysuria and urgency Musculoskeletal: Negative for back pain, joint pain, myalgias.   Normal ROM  Neurological: Negative for dizziness and headaches    Physical Exam  Blood pressure 99/64, pulse (!) 56, temperature 98.4 F (36.9 C), temperature source Oral, resp. rate 16, height 5\' 1"  (1.549 m), weight 57.6 kg (127 lb), last menstrual period 09/21/2015, SpO2 99 %, unknown if currently breastfeeding. GENERAL: Well-developed, well-nourished female in no acute distress.  LUNGS: Clear to auscultation bilaterally.  HEART: Regular rate and rhythm. ABDOMEN: Soft, nontender, nondistended, gravid.  EXTREMITIES: Nontender, no edema, 2+ distal pulses. DTR's 2+ PELVIC:  SSE: no pooling; normal appearing dc. Negative fern and valsalva CERVICAL EXAM: Dilatation 3cm   Effacement 20%   Station -2   Presentation: cephalic FHT:  Baseline rate 140 bpm   Variability moderate  Accelerations present   Decelerations none Contractions: Every 0 mins   Labs: No results found for this or any previous visit (from the past 24 hour(s)). Imaging Studies:    Assessment: Shelby KluverDiamond [name] Hunnewell is  20 y.o. [phone] at 5439w6d presents with no evidence of ROM.  Plan: DC h ome  CRESENZO-DISHMAN,Terisha Losasso 10/20/201712:13 AM

## 2016-07-02 NOTE — Discharge Instructions (Signed)
Braxton Hicks Contractions °Contractions of the uterus can occur throughout pregnancy. Contractions are not always a sign that you are in labor.  °WHAT ARE BRAXTON HICKS CONTRACTIONS?  °Contractions that occur before labor are called Braxton Hicks contractions, or false labor. Toward the end of pregnancy (32-34 weeks), these contractions can develop more often and may become more forceful. This is not true labor because these contractions do not result in opening (dilatation) and thinning of the cervix. They are sometimes difficult to tell apart from true labor because these contractions can be forceful and people have different pain tolerances. You should not feel embarrassed if you go to the hospital with false labor. Sometimes, the only way to tell if you are in true labor is for your health care provider to look for changes in the cervix. °If there are no prenatal problems or other health problems associated with the pregnancy, it is completely safe to be sent home with false labor and await the onset of true labor. °HOW CAN YOU TELL THE DIFFERENCE BETWEEN TRUE AND FALSE LABOR? °False Labor °· The contractions of false labor are usually shorter and not as hard as those of true labor.   °· The contractions are usually irregular.   °· The contractions are often felt in the front of the lower abdomen and in the groin.   °· The contractions may go away when you walk around or change positions while lying down.   °· The contractions get weaker and are shorter lasting as time goes on.   °· The contractions do not usually become progressively stronger, regular, and closer together as with true labor.   °True Labor °· Contractions in true labor last 30-70 seconds, become very regular, usually become more intense, and increase in frequency.   °· The contractions do not go away with walking.   °· The discomfort is usually felt in the top of the uterus and spreads to the lower abdomen and low back.   °· True labor can be  determined by your health care provider with an exam. This will show that the cervix is dilating and getting thinner.   °WHAT TO REMEMBER °· Keep up with your usual exercises and follow other instructions given by your health care provider.   °· Take medicines as directed by your health care provider.   °· Keep your regular prenatal appointments.   °· Eat and drink lightly if you think you are going into labor.   °· If Braxton Hicks contractions are making you uncomfortable:   °¨ Change your position from lying down or resting to walking, or from walking to resting.   °¨ Sit and rest in a tub of warm water.   °¨ Drink 2-3 glasses of water. Dehydration may cause these contractions.   °¨ Do slow and deep breathing several times an hour.   °WHEN SHOULD I SEEK IMMEDIATE MEDICAL CARE? °Seek immediate medical care if: °· Your contractions become stronger, more regular, and closer together.   °· You have fluid leaking or gushing from your vagina.   °· You have a fever.   °· You pass blood-tinged mucus.   °· You have vaginal bleeding.   °· You have continuous abdominal pain.   °· You have low back pain that you never had before.   °· You feel your baby's head pushing down and causing pelvic pressure.   °· Your baby is not moving as much as it used to.   °  °This information is not intended to replace advice given to you by your health care provider. Make sure you discuss any questions you have with your health care   provider. °  °Document Released: 08/30/2005 Document Revised: 09/04/2013 Document Reviewed: 06/11/2013 °Elsevier Interactive Patient Education ©2016 [employer] °Fetal Movement Counts °Patient Name: __________________________________________________ Patient Due Date: ____________________ °Performing a fetal movement count is highly recommended in high-risk pregnancies, but it is good for every pregnant woman to do. Your health care provider may ask you to start counting fetal movements at 28 weeks of the  pregnancy. Fetal movements often increase: °· After eating a full meal. °· After physical activity. °· After eating or drinking something sweet or cold. °· At rest. °Pay attention to when you feel the baby is most active. This will help you notice a pattern of your baby's sleep and wake cycles and what factors contribute to an increase in fetal movement. It is important to perform a fetal movement count at the same time each day when your baby is normally most active.  °HOW TO COUNT FETAL MOVEMENTS °1. Find a quiet and comfortable area to sit or lie down on your left side. Lying on your left side provides the best blood and oxygen circulation to your baby. °2. Write down the day and time on a sheet of paper or in a journal. °3. Start counting kicks, flutters, swishes, rolls, or jabs in a 2-hour period. You should feel at least 10 movements within 2 hours. °4. If you do not feel 10 movements in 2 hours, wait 2-3 hours and count again. Look for a change in the pattern or not enough counts in 2 hours. °SEEK MEDICAL CARE IF: °· You feel less than 10 counts in 2 hours, tried twice. °· There is no movement in over an hour. °· The pattern is changing or taking longer each day to reach 10 counts in 2 hours. °· You feel the baby is not moving as he or she usually does. °Date: ____________ Movements: ____________ Start time: ____________ Finish time: ____________  °Date: ____________ Movements: ____________ Start time: ____________ Finish time: ____________ °Date: ____________ Movements: ____________ Start time: ____________ Finish time: ____________ °Date: ____________ Movements: ____________ Start time: ____________ Finish time: ____________ °Date: ____________ Movements: ____________ Start time: ____________ Finish time: ____________ °Date: ____________ Movements: ____________ Start time: ____________ Finish time: ____________ °Date: ____________ Movements: ____________ Start time: ____________ Finish time:  ____________ °Date: ____________ Movements: ____________ Start time: ____________ Finish time: ____________  °Date: ____________ Movements: ____________ Start time: ____________ Finish time: ____________ °Date: ____________ Movements: ____________ Start time: ____________ Finish time: ____________ °Date: ____________ Movements: ____________ Start time: ____________ Finish time: ____________ °Date: ____________ Movements: ____________ Start time: ____________ Finish time: ____________ °Date: ____________ Movements: ____________ Start time: ____________ Finish time: ____________ °Date: ____________ Movements: ____________ Start time: ____________ Finish time: ____________ °Date: ____________ Movements: ____________ Start time: ____________ Finish time: ____________  °Date: ____________ Movements: ____________ Start time: ____________ Finish time: ____________ °Date: ____________ Movements: ____________ Start time: ____________ Finish time: ____________ °Date: ____________ Movements: ____________ Start time: ____________ Finish time: ____________ °Date: ____________ Movements: ____________ Start time: ____________ Finish time: ____________ °Date: ____________ Movements: ____________ Start time: ____________ Finish time: ____________ °Date: ____________ Movements: ____________ Start time: ____________ Finish time: ____________ °Date: ____________ Movements: ____________ Start time: ____________ Finish time: ____________  °Date: ____________ Movements: ____________ Start time: ____________ Finish time: ____________ °Date: ____________ Movements: ____________ Start time: ____________ Finish time: ____________ °Date: ____________ Movements: ____________ Start time: ____________ Finish time: ____________ °Date: ____________ Movements: ____________ Start time: ____________ Finish time: ____________ °Date: ____________ Movements: ____________ Start time: ____________ Finish time: ____________ °Date: ____________ Movements:  ____________ Start time: ____________ Finish time:   ____________ °Date: ____________ Movements: ____________ Start time: ____________ Finish time: ____________  °Date: ____________ Movements: ____________ Start time: ____________ Finish time: ____________ °Date: ____________ Movements: ____________ Start time: ____________ Finish time: ____________ °Date: ____________ Movements: ____________ Start time: ____________ Finish time: ____________ °Date: ____________ Movements: ____________ Start time: ____________ Finish time: ____________ °Date: ____________ Movements: ____________ Start time: ____________ Finish time: ____________ °Date: ____________ Movements: ____________ Start time: ____________ Finish time: ____________ °Date: ____________ Movements: ____________ Start time: ____________ Finish time: ____________  °Date: ____________ Movements: ____________ Start time: ____________ Finish time: ____________ °Date: ____________ Movements: ____________ Start time: ____________ Finish time: ____________ °Date: ____________ Movements: ____________ Start time: ____________ Finish time: ____________ °Date: ____________ Movements: ____________ Start time: ____________ Finish time: ____________ °Date: ____________ Movements: ____________ Start time: ____________ Finish time: ____________ °Date: ____________ Movements: ____________ Start time: ____________ Finish time: ____________ °Date: ____________ Movements: ____________ Start time: ____________ Finish time: ____________  °Date: ____________ Movements: ____________ Start time: ____________ Finish time: ____________ °Date: ____________ Movements: ____________ Start time: ____________ Finish time: ____________ °Date: ____________ Movements: ____________ Start time: ____________ Finish time: ____________ °Date: ____________ Movements: ____________ Start time: ____________ Finish time: ____________ °Date: ____________ Movements: ____________ Start time: ____________ Finish  time: ____________ °Date: ____________ Movements: ____________ Start time: ____________ Finish time: ____________ °Date: ____________ Movements: ____________ Start time: ____________ Finish time: ____________  °Date: ____________ Movements: ____________ Start time: ____________ Finish time: ____________ °Date: ____________ Movements: ____________ Start time: ____________ Finish time: ____________ °Date: ____________ Movements: ____________ Start time: ____________ Finish time: ____________ °Date: ____________ Movements: ____________ Start time: ____________ Finish time: ____________ °Date: ____________ Movements: ____________ Start time: ____________ Finish time: ____________ °Date: ____________ Movements: ____________ Start time: ____________ Finish time: ____________ °  °This information is not intended to replace advice given to you by your health care provider. Make sure you discuss any questions you have with your health care provider. °  °Document Released: 09/29/2006 Document Revised: 09/20/2014 Document Reviewed: 06/26/2012 °Elsevier Interactive Patient Education ©2016 [employer] Inc[employer] ° °

## 2016-07-08 ENCOUNTER — Other Ambulatory Visit: Payer: Self-pay | Admitting: Obstetrics and Gynecology

## 2016-07-11 ENCOUNTER — Encounter (HOSPITAL_COMMUNITY): Payer: Self-pay

## 2016-07-11 ENCOUNTER — Inpatient Hospital Stay (HOSPITAL_COMMUNITY)
Admission: AD | Admit: 2016-07-11 | Discharge: 2016-07-11 | Disposition: A | Discharge status: Home / Self Care | Payer: Medicaid Other | Source: Ambulatory Visit | Attending: Obstetrics & Gynecology | Admitting: Obstetrics & Gynecology

## 2016-07-11 DIAGNOSIS — O479 False labor, unspecified: Secondary | ICD-10-CM

## 2016-07-11 DIAGNOSIS — Z87891 Personal history of nicotine dependence: Secondary | ICD-10-CM | POA: Insufficient documentation

## 2016-07-11 DIAGNOSIS — Z3A38 38 weeks gestation of pregnancy: Secondary | ICD-10-CM | POA: Diagnosis not present

## 2016-07-11 DIAGNOSIS — R110 Nausea: Secondary | ICD-10-CM | POA: Insufficient documentation

## 2016-07-11 DIAGNOSIS — O219 Vomiting of pregnancy, unspecified: Secondary | ICD-10-CM | POA: Diagnosis not present

## 2016-07-11 DIAGNOSIS — Z3A37 37 weeks gestation of pregnancy: Secondary | ICD-10-CM

## 2016-07-11 DIAGNOSIS — O99613 Diseases of the digestive system complicating pregnancy, third trimester: Secondary | ICD-10-CM | POA: Insufficient documentation

## 2016-07-11 DIAGNOSIS — K219 Gastro-esophageal reflux disease without esophagitis: Secondary | ICD-10-CM | POA: Diagnosis not present

## 2016-07-11 DIAGNOSIS — R109 Unspecified abdominal pain: Secondary | ICD-10-CM | POA: Diagnosis present

## 2016-07-11 DIAGNOSIS — O471 False labor at or after 37 completed weeks of gestation: Secondary | ICD-10-CM | POA: Insufficient documentation

## 2016-07-11 DIAGNOSIS — R519 Headache, unspecified: Secondary | ICD-10-CM

## 2016-07-11 DIAGNOSIS — R51 Headache: Secondary | ICD-10-CM

## 2016-07-11 MED ORDER — BUTALBITAL-APAP-CAFFEINE 50-325-40 MG PO TABS
2.0000 | ORAL_TABLET | Freq: Once | ORAL | Status: AC
  Administered 2016-07-11: 2 via ORAL
  Filled 2016-07-11: qty 2

## 2016-07-11 MED ORDER — ONDANSETRON 8 MG PO TBDP
8.0000 mg | ORAL_TABLET | Freq: Once | ORAL | Status: AC
  Administered 2016-07-11: 8 mg via ORAL
  Filled 2016-07-11: qty 1

## 2016-07-11 NOTE — MAU Note (Signed)
Patient c/o headache and nausea wants medication, called Renetta ChalkAshley Ellis CNM Student will evaluate patient in MAU

## 2016-07-11 NOTE — MAU Note (Addendum)
Onset of contractions around 11:30 at church, denies vaginal bleeding or LOF. Patient states that she has had nothing to eat or drink all day.  Patient given Sprite and Water, mother at bedside encouraged to hydrate.

## 2016-07-11 NOTE — Discharge Instructions (Signed)
Braxton Hicks Contractions °Contractions of the uterus can occur throughout pregnancy. Contractions are not always a sign that you are in labor.  °WHAT ARE BRAXTON HICKS CONTRACTIONS?  °Contractions that occur before labor are called Braxton Hicks contractions, or false labor. Toward the end of pregnancy (32-34 weeks), these contractions can develop more often and may become more forceful. This is not true labor because these contractions do not result in opening (dilatation) and thinning of the cervix. They are sometimes difficult to tell apart from true labor because these contractions can be forceful and people have different pain tolerances. You should not feel embarrassed if you go to the hospital with false labor. Sometimes, the only way to tell if you are in true labor is for your health care provider to look for changes in the cervix. °If there are no prenatal problems or other health problems associated with the pregnancy, it is completely safe to be sent home with false labor and await the onset of true labor. °HOW CAN YOU TELL THE DIFFERENCE BETWEEN TRUE AND FALSE LABOR? °False Labor °· The contractions of false labor are usually shorter and not as hard as those of true labor.   °· The contractions are usually irregular.   °· The contractions are often felt in the front of the lower abdomen and in the groin.   °· The contractions may go away when you walk around or change positions while lying down.   °· The contractions get weaker and are shorter lasting as time goes on.   °· The contractions do not usually become progressively stronger, regular, and closer together as with true labor.   °True Labor °· Contractions in true labor last 30-70 seconds, become very regular, usually become more intense, and increase in frequency.   °· The contractions do not go away with walking.   °· The discomfort is usually felt in the top of the uterus and spreads to the lower abdomen and low back.   °· True labor can be  determined by your health care provider with an exam. This will show that the cervix is dilating and getting thinner.   °WHAT TO REMEMBER °· Keep up with your usual exercises and follow other instructions given by your health care provider.   °· Take medicines as directed by your health care provider.   °· Keep your regular prenatal appointments.   °· Eat and drink lightly if you think you are going into labor.   °· If Braxton Hicks contractions are making you uncomfortable:   °¨ Change your position from lying down or resting to walking, or from walking to resting.   °¨ Sit and rest in a tub of warm water.   °¨ Drink 2-3 glasses of water. Dehydration may cause these contractions.   °¨ Do slow and deep breathing several times an hour.   °WHEN SHOULD I SEEK IMMEDIATE MEDICAL CARE? °Seek immediate medical care if: °· Your contractions become stronger, more regular, and closer together.   °· You have fluid leaking or gushing from your vagina.   °· You have a fever.   °· You pass blood-tinged mucus.   °· You have vaginal bleeding.   °· You have continuous abdominal pain.   °· You have low back pain that you never had before.   °· You feel your baby's head pushing down and causing pelvic pressure.   °· Your baby is not moving as much as it used to.   °  °This information is not intended to replace advice given to you by your health care provider. Make sure you discuss any questions you have with your health care   provider. °  °Document Released: 08/30/2005 Document Revised: 09/04/2013 Document Reviewed: 06/11/2013 °Elsevier Interactive Patient Education ©2016 [employer] Inc. ° °

## 2016-07-11 NOTE — MAU Provider Note (Signed)
History    Shelby Serrano is a Irena Reichmann20yo [id number] @ 38.1wks who presents with abd pain since noon. Pt states she is very worried about the baby and wants to be reassured infant is ok. Upon examination, pt states she is very nauseous at times and has a headache. Pt has not eaten at all today.  Denies any VB, LOF. FM+ CSN: [id number]  Arrival date & time 07/11/16  1200   None     No chief complaint on file.   HPI  History reviewed. No pertinent past medical history.  History reviewed. No pertinent surgical history.  Family History  Problem Relation Age of Onset  . Diabetes Mother   . Hypertension Mother     Social History  Substance Use Topics  . Smoking status: Former Smoker    Types: Cigarettes    Quit date: 02/08/2016  . Smokeless tobacco: Never Used  . Alcohol use No    OB History    Gravida Para Term Preterm AB Living   4 1 1   2 1    SAB TAB Ectopic Multiple Live Births   2       1      Review of Systems  Constitutional: Negative.   HENT: Negative.   Eyes: Negative.   Respiratory: Negative.   Cardiovascular: Negative.   Gastrointestinal: Positive for abdominal pain and nausea.  Endocrine: Negative.   Genitourinary: Negative.   Musculoskeletal: Negative.   Skin: Negative.   Allergic/Immunologic: Negative.   Neurological: Negative.   Hematological: Negative.   Psychiatric/Behavioral: Negative.     Allergies  Review of patient's allergies indicates no known allergies.  Home Medications    BP 111/68 (BP Location: Right Arm)   Pulse 72   Temp 98.2 F (36.8 C)   Resp 18   LMP 09/21/2015   Physical Exam  Constitutional: She is oriented to person, place, and time. She appears well-developed and well-nourished.  HENT:  Head: Normocephalic.  Eyes: Conjunctivae are normal. Pupils are equal, round, and reactive to light.  Neck: Normal range of motion. Neck supple.  Cardiovascular: Normal rate and regular rhythm.   Pulmonary/Chest: Effort normal and breath  sounds normal.  Abdominal: Soft. Bowel sounds are normal.  Genitourinary: Vagina normal and uterus normal.  Genitourinary Comments: SVE 3-4/50/-2  Musculoskeletal: Normal range of motion.  Neurological: She is alert and oriented to person, place, and time.  Skin: Skin is warm and dry.  Psychiatric: She has a normal mood and affect. Her behavior is normal. Judgment and thought content normal.    MAU Course  Procedures (including critical care time)  Labs Reviewed - No data to display No results found.   No diagnosis found.    MDM  1. IUP @ 38.1 wks 2. Deberah PeltonBraxton Hicks Ctx's/ False Labor 3. Nausea vs GERD 4. Reactive NST  Plan: discussed eating small frequent meals every 2-3 hours, taking Zantac or Prilosec as needed for GERD; Discussed at length s/s of labor and labor precautions, and FKC's; will return to office for scheduled OB visit

## 2016-07-11 NOTE — MAU Note (Signed)
Patient to bathroom to voided, mother and this RN assisted to bathroom and back to bed.

## 2016-07-15 ENCOUNTER — Inpatient Hospital Stay (HOSPITAL_COMMUNITY): Payer: Medicaid Other | Admitting: Anesthesiology

## 2016-07-15 ENCOUNTER — Encounter (HOSPITAL_COMMUNITY): Payer: Self-pay | Admitting: *Deleted

## 2016-07-15 ENCOUNTER — Inpatient Hospital Stay (HOSPITAL_COMMUNITY)
Admission: AD | Admit: 2016-07-15 | Discharge: 2016-07-17 | DRG: 775 | Disposition: A | Discharge status: Home / Self Care | Payer: Medicaid Other | Source: Ambulatory Visit | Attending: Obstetrics & Gynecology | Admitting: Obstetrics & Gynecology

## 2016-07-15 ENCOUNTER — Ambulatory Visit (INDEPENDENT_AMBULATORY_CARE_PROVIDER_SITE_OTHER): Payer: Medicaid Other | Admitting: Obstetrics & Gynecology

## 2016-07-15 ENCOUNTER — Ambulatory Visit: Payer: Self-pay

## 2016-07-15 VITALS — BP 112/66 | HR 74 | Wt 126.7 lb

## 2016-07-15 DIAGNOSIS — Z8249 Family history of ischemic heart disease and other diseases of the circulatory system: Secondary | ICD-10-CM | POA: Diagnosis not present

## 2016-07-15 DIAGNOSIS — Z833 Family history of diabetes mellitus: Secondary | ICD-10-CM | POA: Diagnosis not present

## 2016-07-15 DIAGNOSIS — Z3A38 38 weeks gestation of pregnancy: Secondary | ICD-10-CM

## 2016-07-15 DIAGNOSIS — O365990 Maternal care for other known or suspected poor fetal growth, unspecified trimester, not applicable or unspecified: Secondary | ICD-10-CM | POA: Diagnosis not present

## 2016-07-15 DIAGNOSIS — Z3483 Encounter for supervision of other normal pregnancy, third trimester: Secondary | ICD-10-CM | POA: Diagnosis present

## 2016-07-15 DIAGNOSIS — Z87891 Personal history of nicotine dependence: Secondary | ICD-10-CM | POA: Diagnosis not present

## 2016-07-15 DIAGNOSIS — Z3689 Encounter for other specified antenatal screening: Secondary | ICD-10-CM

## 2016-07-15 DIAGNOSIS — O365930 Maternal care for other known or suspected poor fetal growth, third trimester, not applicable or unspecified: Secondary | ICD-10-CM | POA: Diagnosis present

## 2016-07-15 LAB — TYPE AND SCREEN
ABO/RH(D): O POS
ANTIBODY SCREEN: NEGATIVE

## 2016-07-15 LAB — CBC
HEMATOCRIT: 33.6 % — AB (ref 36.0–46.0)
Hemoglobin: 11.5 g/dL — ABNORMAL LOW (ref 12.0–15.0)
MCH: 26.7 pg (ref 26.0–34.0)
MCHC: 34.2 g/dL (ref 30.0–36.0)
MCV: 78.1 fL (ref 78.0–100.0)
Platelets: 123 10*3/uL — ABNORMAL LOW (ref 150–400)
RBC: 4.3 MIL/uL (ref 3.87–5.11)
RDW: 15.4 % (ref 11.5–15.5)
WBC: 10.1 10*3/uL (ref 4.0–10.5)

## 2016-07-15 MED ORDER — IBUPROFEN 600 MG PO TABS
600.0000 mg | ORAL_TABLET | Freq: Four times a day (QID) | ORAL | Status: DC
  Administered 2016-07-15 – 2016-07-17 (×7): 600 mg via ORAL
  Filled 2016-07-15 (×7): qty 1

## 2016-07-15 MED ORDER — DIPHENHYDRAMINE HCL 25 MG PO CAPS
25.0000 mg | ORAL_CAPSULE | Freq: Four times a day (QID) | ORAL | Status: DC | PRN
  Administered 2016-07-15: 25 mg via ORAL
  Filled 2016-07-15: qty 1

## 2016-07-15 MED ORDER — WITCH HAZEL-GLYCERIN EX PADS: 1.0000 "application " | MEDICATED_PAD | CUTANEOUS | Status: DC | PRN

## 2016-07-15 MED ORDER — PHENYLEPHRINE 40 MCG/ML (10ML) SYRINGE FOR IV PUSH (FOR BLOOD PRESSURE SUPPORT)
80.0000 ug | PREFILLED_SYRINGE | INTRAVENOUS | Status: DC | PRN
  Filled 2016-07-15: qty 10
  Filled 2016-07-15: qty 5

## 2016-07-15 MED ORDER — FENTANYL 2.5 MCG/ML BUPIVACAINE 1/10 % EPIDURAL INFUSION (WH - ANES)
14.0000 mL/h | INTRAMUSCULAR | Status: DC | PRN
  Administered 2016-07-15: 14 mL/h via EPIDURAL
  Filled 2016-07-15: qty 100

## 2016-07-15 MED ORDER — FENTANYL CITRATE (PF) 100 MCG/2ML IJ SOLN: 100.0000 ug | INTRAMUSCULAR | Status: DC | PRN

## 2016-07-15 MED ORDER — ZOLPIDEM TARTRATE 5 MG PO TABS: 5.0000 mg | ORAL_TABLET | Freq: Every evening | ORAL | Status: DC | PRN

## 2016-07-15 MED ORDER — ONDANSETRON HCL 4 MG/2ML IJ SOLN
4.0000 mg | Freq: Four times a day (QID) | INTRAMUSCULAR | Status: DC | PRN
  Administered 2016-07-15: 4 mg via INTRAVENOUS
  Filled 2016-07-15: qty 2

## 2016-07-15 MED ORDER — ACETAMINOPHEN 325 MG PO TABS
650.0000 mg | ORAL_TABLET | ORAL | Status: DC | PRN
  Administered 2016-07-16 – 2016-07-17 (×4): 650 mg via ORAL
  Filled 2016-07-15 (×4): qty 2

## 2016-07-15 MED ORDER — LIDOCAINE HCL (PF) 1 % IJ SOLN
INTRAMUSCULAR | Status: DC | PRN
  Administered 2016-07-15 (×2): 7 mL via EPIDURAL

## 2016-07-15 MED ORDER — OXYTOCIN 40 UNITS IN LACTATED RINGERS INFUSION - SIMPLE MED: 2.5000 [IU]/h | INTRAVENOUS | Status: DC

## 2016-07-15 MED ORDER — EPHEDRINE 5 MG/ML INJ
10.0000 mg | INTRAVENOUS | Status: DC | PRN
  Filled 2016-07-15: qty 4

## 2016-07-15 MED ORDER — LACTATED RINGERS IV SOLN
INTRAVENOUS | Status: DC
  Administered 2016-07-15 (×2): via INTRAVENOUS

## 2016-07-15 MED ORDER — LACTATED RINGERS IV SOLN: 500.0000 mL | Freq: Once | INTRAVENOUS | Status: DC

## 2016-07-15 MED ORDER — ONDANSETRON HCL 4 MG PO TABS
4.0000 mg | ORAL_TABLET | ORAL | Status: DC | PRN
Start: 2016-07-15 — End: 2016-07-17

## 2016-07-15 MED ORDER — SENNOSIDES-DOCUSATE SODIUM 8.6-50 MG PO TABS
2.0000 | ORAL_TABLET | ORAL | Status: DC
  Administered 2016-07-15 – 2016-07-16 (×2): 2 via ORAL
  Filled 2016-07-15 (×2): qty 2

## 2016-07-15 MED ORDER — OXYCODONE-ACETAMINOPHEN 5-325 MG PO TABS: 1.0000 | ORAL_TABLET | ORAL | Status: DC | PRN

## 2016-07-15 MED ORDER — TETANUS-DIPHTH-ACELL PERTUSSIS 5-2.5-18.5 LF-MCG/0.5 IM SUSP: 0.5000 mL | Freq: Once | INTRAMUSCULAR | Status: DC

## 2016-07-15 MED ORDER — ONDANSETRON HCL 4 MG/2ML IJ SOLN: 4.0000 mg | INTRAMUSCULAR | Status: DC | PRN

## 2016-07-15 MED ORDER — OXYCODONE-ACETAMINOPHEN 5-325 MG PO TABS: 2.0000 | ORAL_TABLET | ORAL | Status: DC | PRN

## 2016-07-15 MED ORDER — DIPHENHYDRAMINE HCL 50 MG/ML IJ SOLN: 12.5000 mg | INTRAMUSCULAR | Status: DC | PRN

## 2016-07-15 MED ORDER — COCONUT OIL OIL: 1.0000 "application " | TOPICAL_OIL | Status: DC | PRN

## 2016-07-15 MED ORDER — FLEET ENEMA 7-19 GM/118ML RE ENEM: 1.0000 | ENEMA | Freq: Every day | RECTAL | Status: DC | PRN

## 2016-07-15 MED ORDER — PRENATAL MULTIVITAMIN CH
1.0000 | ORAL_TABLET | Freq: Every day | ORAL | Status: DC
  Administered 2016-07-16 – 2016-07-17 (×2): 1 via ORAL
  Filled 2016-07-15 (×2): qty 1

## 2016-07-15 MED ORDER — SIMETHICONE 80 MG PO CHEW: 80.0000 mg | CHEWABLE_TABLET | ORAL | Status: DC | PRN

## 2016-07-15 MED ORDER — LACTATED RINGERS IV SOLN: 500.0000 mL | INTRAVENOUS | Status: DC | PRN

## 2016-07-15 MED ORDER — LIDOCAINE HCL (PF) 1 % IJ SOLN
30.0000 mL | INTRAMUSCULAR | Status: DC | PRN
  Filled 2016-07-15: qty 30

## 2016-07-15 MED ORDER — ACETAMINOPHEN 325 MG PO TABS
650.0000 mg | ORAL_TABLET | ORAL | Status: DC | PRN
  Administered 2016-07-15: 650 mg via ORAL
  Filled 2016-07-15: qty 2

## 2016-07-15 MED ORDER — BENZOCAINE-MENTHOL 20-0.5 % EX AERO: 1.0000 "application " | INHALATION_SPRAY | CUTANEOUS | Status: DC | PRN

## 2016-07-15 MED ORDER — DIBUCAINE 1 % RE OINT: 1.0000 "application " | TOPICAL_OINTMENT | RECTAL | Status: DC | PRN

## 2016-07-15 MED ORDER — OXYTOCIN BOLUS FROM INFUSION
500.0000 mL | Freq: Once | INTRAVENOUS | Status: AC
  Administered 2016-07-15: 500 mL via INTRAVENOUS

## 2016-07-15 MED ORDER — SOD CITRATE-CITRIC ACID 500-334 MG/5ML PO SOLN: 30.0000 mL | ORAL | Status: DC | PRN

## 2016-07-15 NOTE — Anesthesia Preprocedure Evaluation (Signed)
Anesthesia Evaluation  Patient identified by MRN, date of birth, ID band Patient awake    Reviewed: Allergy & Precautions, H&P , NPO status , Patient's Chart, lab work & pertinent test results  Airway Mallampati: I  TM Distance: >3 FB Neck ROM: full    Dental no notable dental hx.    Pulmonary neg pulmonary ROS, former smoker,    Pulmonary exam normal        Cardiovascular negative cardio ROS Normal cardiovascular exam     Neuro/Psych negative neurological ROS  negative psych ROS   GI/Hepatic negative GI ROS, Neg liver ROS,   Endo/Other  negative endocrine ROS  Renal/GU negative Renal ROS     Musculoskeletal   Abdominal Normal abdominal exam  (+)   Peds  Hematology negative hematology ROS (+)   Anesthesia Other Findings   Reproductive/Obstetrics (+) Pregnancy                             Anesthesia Physical Anesthesia Plan  ASA: II  Anesthesia Plan: Epidural   Post-op Pain Management:    Induction:   Airway Management Planned:   Additional Equipment:   Intra-op Plan:   Post-operative Plan:   Informed Consent: I have reviewed the patients History and Physical, chart, labs and discussed the procedure including the risks, benefits and alternatives for the proposed anesthesia with the patient or authorized representative who has indicated his/her understanding and acceptance.     Plan Discussed with:   Anesthesia Plan Comments:         Anesthesia Quick Evaluation  

## 2016-07-15 NOTE — H&P (Signed)
LABOR AND DELIVERY ADMISSION HISTORY AND PHYSICAL NOTE  Shelby Serrano is a 20 y.o. female [phone] with IUP at 5511w5d by 22 week US presenting for SOL. She was seen in the clinic this morning and found to be 6 cm.   Has been having intense contractions. Does not think her water has broke.  She reports positive fetal movement. She denies leakage of fluid, does report bloody show. She is moderately uncomfortable with contractions currently and is requesting epidural.  Prenatal History/Complications: Late to prenatal care Past Medical History: History reviewed. No pertinent past medical history.  Past Surgical History: History reviewed. No pertinent surgical history.  Obstetrical History: OB History    Gravida Para Term Preterm AB Living   4 1 1   2 1    SAB TAB Ectopic Multiple Live Births   2       1      Social History: Social History   Social History  . Marital status: Single    Spouse name: N/A  . Number of children: N/A  . Years of education: N/A   Social History Main Topics  . Smoking status: Former Smoker    Types: Cigarettes    Quit date: 02/08/2016  . Smokeless tobacco: Never Used  . Alcohol use No  . Drug use:     Types: Marijuana     Comment: Patient denies, [facility]  . Sexual activity: Yes    Birth control/ protection: None   Other Topics Concern  . None   Social History Narrative  . None    Family History: Family History  Problem Relation Age of Onset  . Diabetes Mother   . Hypertension Mother     Allergies: No Known Allergies  Prescriptions Prior to Admission  Medication Sig Dispense Refill Last Dose  . acetaminophen (TYLENOL) 325 MG tablet Take 650 mg by mouth every 6 (six) hours as needed for moderate pain.    Taking  . prenatal vitamin w/FE, FA (PRENATAL 1 + 1) 27-1 MG TABS tablet Take 1 tablet by mouth daily. 30 each 10 Taking     Review of Systems   All systems reviewed and negative except as stated in  HPI  Blood pressure 118/81, pulse 65, temperature 97.6 F (36.4 C), temperature source Oral, resp. rate 16, height 5\' 1"  (1.549 m), weight 58 kg (127 lb 14.4 oz), last menstrual period 09/21/2015, unknown if currently breastfeeding. General appearance: alert, cooperative and mild distress Lungs: no respiratory distress Heart: regular rate Abdomen: soft, non-tender Extremities: No calf swelling or tenderness Presentation: cephalic by nursing exam Fetal monitoring: baseline 140, moderate variability, accelerations present, no decelerations Uterine activity: q4-5 minutes     Prenatal labs: ABO, Rh: --/--/O POS (05/14 1724) Antibody:   Rubella: immune RPR: NON REAC (08/01 1422)  HBsAg: Negative (05/14 1846)  HIV: NONREACTIVE (08/01 1422)  GBS:   negative  Prenatal Transfer Tool  Maternal Diabetes: No Genetic Screening: Declined- too late for screening Maternal Ultrasounds/Referrals: Normal Fetal Ultrasounds or other Referrals:  Referred to Materal Fetal Medicine  Maternal Substance Abuse:  No Significant Maternal Medications:  None Significant Maternal Lab Results: None  No results found for this or any previous visit (from the past 24 hour(s)).  Patient Active Problem List   Diagnosis Date Noted  . Normal labor 07/15/2016  . Late prenatal care 03/10/2016  . Encounter for supervision of normal pregnancy in multigravida 03/10/2016    Assessment: Shelby Serrano is a 20  y.o. [id number] at 8311w5d here for SOL.  #Labor: SOL, anticipate SVD #Pain: Desires epidural #FWB: Category 1 tracing #ID:  GBS negative #MOF: both breast and bottle #MOC: undecided, considering IUD #Circ:  n/a  Tillman SersAngela C Riccio, DO PGY-1 11/2/20172:31 PM  OB CNM attestation:  I have seen and examined this patient; I agree with above documentation in the Riccio's note.   Shelby Serrano is a 20 y.o. [phone] reporting contractions +FM, denies LOF, VB, contractions, vaginal  discharge.  PE: BP 121/72   Pulse 69   Temp 97.6 F (36.4 C) (Oral)   Resp (!) 24   Ht 5\' 1"  (1.549 m)   Wt 127 lb 14.4 oz (58 kg)   LMP 09/21/2015   SpO2 100%   BMI 24.17 kg/m  Gen: calm comfortable, NAD Resp: normal effort, no distress Abd: gravid  ROS, labs, PMH reviewed NST reactive FHR is 135, accelerations present and no decelerations; irregular contractions.   Plan: Epidural now in place; AROM at 1545 clear fluid. Anticipiate NSVD.   Marylene LandKathryn Lorraine Serrano, CNM 3:54 PM

## 2016-07-15 NOTE — Anesthesia Procedure Notes (Signed)
Epidural Patient location during procedure: OB Start time: 07/15/2016 3:05 PM End time: 07/15/2016 3:09 PM  Staffing Anesthesiologist: Leilani AbleHATCHETT, Janelle Culton  Preanesthetic Checklist Completed: patient identified, surgical consent, pre-op evaluation, timeout performed, IV checked, risks and benefits discussed and monitors and equipment checked  Epidural Patient position: sitting Prep: site prepped and draped and DuraPrep Patient monitoring: continuous pulse ox and blood pressure Approach: midline Location: L3-L4 Injection technique: LOR air  Needle:  Needle type: Tuohy  Needle gauge: 17 G Needle length: 9 cm and 9 Needle insertion depth: 5 cm cm Catheter type: closed end flexible Catheter size: 19 Gauge Catheter at skin depth: 10 cm Test dose: negative and Other  Assessment Sensory level: T9 Events: blood not aspirated, injection not painful, no injection resistance, negative IV test and no paresthesia  Additional Notes Reason for block:procedure for pain

## 2016-07-15 NOTE — Progress Notes (Signed)
[facility]   PRENATAL VISIT NOTE  Subjective:  Shelby Serrano is a 20 y.o. [phone] at 7510w5d being seen today for ongoing prenatal care.  She is currently monitored for the following issues for this low-risk pregnancy and has Late prenatal care and Encounter for supervision of normal pregnancy in multigravida on her problem list.  Patient reports contractions since 2 hr.  Contractions: Irregular. Vag. Bleeding: Scant.  Movement: Present. Denies leaking of fluid.   The following portions of the patient's history were reviewed and updated as appropriate: allergies, current medications, past family history, past medical history, past social history, past surgical history and problem list. Problem list updated.  Objective:   Vitals:   07/15/16 1303  BP: 112/66  Pulse: 74  Weight: 126 lb 11.2 oz (57.5 kg)    Fetal Status: Fetal Heart Rate (bpm): NST   Movement: Present  Presentation: Vertex  General:  Alert, oriented and cooperative. Patient is in no acute distress.  Skin: Skin is warm and dry. No rash noted.   Cardiovascular: Normal heart rate noted  Respiratory: Normal respiratory effort, no problems with respiration noted  Abdomen: Soft, gravid, appropriate for gestational age. Pain/Pressure: Present     Pelvic:  Cervical exam performed        Extremities: Normal range of motion.  Edema: None  Mental Status: Normal mood and affect. Normal behavior. Normal judgment and thought content.   Assessment and Plan:  Pregnancy: G4P1021 at 3510w5d  1. Encounter for supervision of other normal pregnancy in third trimester Labor  2. Poor fetal growth affecting management of mother, antepartum, single or unspecified fetus  - Fetal nonstress test - US OB Limited  Term labor symptoms and general obstetric precautions including but not limited to vaginal bleeding, contractions, leaking of fluid and fetal movement were reviewed in detail with the patient. Please refer to After Visit  Summary for other counseling recommendations.   To L&D active labor  Adam PhenixJames G Sulayman Manning, MD

## 2016-07-15 NOTE — Progress Notes (Signed)
Patient is resting in bed comfortable with epidural. Cat 1 tracing. Patient is 7/50/-2. AROM at 1545; clear fluid. Anticipate NSVD.  Loma NewtonKathryn Koositra CNM

## 2016-07-16 LAB — RPR: RPR: NONREACTIVE

## 2016-07-16 MED ORDER — OXYCODONE HCL 5 MG PO TABS
5.0000 mg | ORAL_TABLET | ORAL | Status: DC | PRN
  Administered 2016-07-16 – 2016-07-17 (×2): 5 mg via ORAL
  Filled 2016-07-16 (×2): qty 1

## 2016-07-16 NOTE — Progress Notes (Signed)
Pt rates pain 10 for abdominal and back.  Philipp DeputyKim Shaw CNM notified and orders received

## 2016-07-16 NOTE — Lactation Note (Signed)
This note was copied from a baby's chart. Lactation Consultation Note Mom BF in L&D, decided she didn't like BF and it wasn't for her. Has no interest in BF.  Patient Name: Shelby Serrano OZHYQ'MToday's Date: 07/16/2016     Maternal Data    Feeding Feeding Type: Bottle Fed - Formula Nipple Type: Slow - flow  LATCH Score/Interventions                      Lactation Tools Discussed/Used     Consult Status      Keland Peyton G 07/16/2016, 2:46 AM

## 2016-07-16 NOTE — Progress Notes (Signed)
Patient ID: Shelby Kluveriamond Serrano Zertuche, female   DOB: 06-02-1996, 20 y.o.   MRN: [id number]   POSTPARTUM PROGRESS NOTE  Post Partum Day 1 Subjective:  Shelby Serrano Bacallao is a 20 y.o. [id number] 6132w5d s/p SVD.  No acute events overnight.  Pt denies problems with ambulating, voiding or po intake.  She denies nausea or vomiting.  Pain is well controlled.  She has had flatus. She has not had bowel movement.  Lochia Small.   Objective: Blood pressure 104/63, pulse (!) 58, temperature 98.3 F (36.8 C), temperature source Oral, resp. rate 18, height 5\' 1"  (1.549 m), weight 58 kg (127 lb 14.4 oz), last menstrual period 09/21/2015, SpO2 99 %, unknown if currently breastfeeding.  Physical Exam:  General: alert, cooperative and no distress Lochia:normal flow Chest: CTAB Heart: RRR no m/r/g Abdomen: +BS, soft, nontender,  Uterine Fundus: firm, below level of umbilicus DVT Evaluation: No calf swelling or tenderness Extremities: no edema   Recent Labs  07/15/16 1415  HGB 11.5*  HCT 33.6*    Assessment/Plan:  ASSESSMENT: Shelby Serrano Deruiter is a 20 y.o. [id number] 1532w5d s/p SVD  Plan for discharge tomorrow   LOS: 1 day   Tillman Sersngela C Riccio, DO 07/16/2016, 7:39 AM   OB FELLOW POSTPARTUM PROGRESS NOTE ATTESTATION  I have seen and examined this patient and agree with above documentation in the resident's note.   Ernestina PennaNicholas Dillin Lofgren, MD 7:55 AM

## 2016-07-16 NOTE — Progress Notes (Signed)
UR chart review completed.  

## 2016-07-16 NOTE — Anesthesia Postprocedure Evaluation (Signed)
Anesthesia Post Note  Patient: Shelby Serrano  Procedure(s) Performed: * No procedures listed *  Patient location during evaluation: Mother Baby Anesthesia Type: Epidural Level of consciousness: awake, awake and alert, oriented and patient cooperative Pain management: pain level controlled Vital Signs Assessment: post-procedure vital signs reviewed and stable Respiratory status: spontaneous breathing, nonlabored ventilation and respiratory function stable Cardiovascular status: stable Postop Assessment: no headache, no backache, epidural receding, no signs of nausea or vomiting and patient able to bend at knees Anesthetic complications: no     Last Vitals:  Vitals:   07/15/16 2240 07/16/16 0320  BP: 105/67 104/63  Pulse: (!) 56 (!) 58  Resp: 18 18  Temp:  36.8 C    Last Pain:  Vitals:   07/16/16 0705  TempSrc:   PainSc: Asleep   Pain Goal: Patients Stated Pain Goal: 4 (07/15/16 1418)               Mehgan Santmyer L

## 2016-07-16 NOTE — Clinical Social Work Maternal (Signed)
CLINICAL SOCIAL WORK MATERNAL/CHILD NOTE  Patient Details  Name: Shelby Serrano MRN: [id number] Date of Birth: 1995-12-26  Date:  07/16/2016  Clinical Social Worker Initiating Note:  Laurey Arrow Date/ Time Initiated:  07/16/16/1524     Child's Name:  Shelby Serrano   Legal Guardian:  Mother   Need for Interpreter:  None   Date of Referral:  07/16/16     Reason for Referral:  Behavioral Health Issues, including SI , Current Substance Use/Substance Use During Pregnancy  (hx of CPS involvement)   Referral Source:  Central Nursery   Address:  [address] [place] [address]  Phone number:  [phone]   Household Members:  Self, Significant Other (FOB's mother and Father)   Natural Supports (not living in the home):  Immediate Family, Spouse/significant other, Extended Family (FOB's family)   Professional Supports: Case Metallurgist (CPS case worker with [place] )   Employment: Unemployed   Type of Work:     Education:  9 to 11 years   Museum/gallery curator Resources:  Medicaid, SSI/Disability (MOB has a Armed forces technical officer; Shelby Serrano)   Other Resources:  Physicist, medical , [facility]   Cultural/Religious Considerations Which May Impact Care:  none reported  Strengths:  Ability to meet basic needs , Pediatrician chosen , Home prepared for child , Understanding of illness   Risk Factors/Current Problems:  DHHS Involvement , Mental Health Concerns , Substance Use    Cognitive State:  Alert , Able to Concentrate    Mood/Affect:  Anxious , Comfortable , Interested    CSW Assessment: CSW met with MOB to complete an assessment for hx of CPS involvement ([place], [place]), SA use during pregnancy, and MH hx.  When CSW arrived, MOB and FOB had several room guest.  With MOB's permission, CSW asked room guest to leave in effort to meet with CSW in private.  MOB gave FOB permission to remain in the room, and MOB gave CSW permission to meet with MOB while FOB was  present.  MOB was polite and inviting, however, MOB expressed that MOB felt scared because MOB had to meet with CSW.  CSW explained CSW role and encouraged MOB to relax.  CSW inquired about MOB's CPS hx.  MOB forthcoming and reported that MOB does not currently have custody of MOB's oldest son Shelby Serrano 04/05/12). MOB communicated that MOB and MOB's biological mother were pregnant by the same man Shelby Serrano 01/01/93) and "things got complicated".  MOB was unable to express in word how things became complicated, however MOB informed CSW that MOB was given the option to give MOB's uncle Shelby Serrano [phone]) custody of MOB's son or CPS would place MOB's son in foster care.  MOB communicated that it was a difficult decision, but MOB decided to place MOB's son with MOB's uncle. CSW validated MOB's thoughts and feelings. CSW thanked MOB for being honest. CSW contacted [place] CPS unit and was informed that MOB's CPS is currently closed. CSW inquired about MOB's substance use during pregnancy and MOB acknowledged limited use of marijuana during pregnancy to help reduce MOB's stress. CSW offered MOB outpatient resources for SA and stress management and MOB declined.  CSW informed MOB of the hospital's drug screen policy, and informed MOB of the 2 screenings for the infant.  CSW explained that the infant's UDS was negative, and CSW will monitor the infant's Cord and report the results to [facility] CPS.  CSW made MOB aware that CSW would  be making a report to [facility] CPS due to MOB's substance use during pregnancy, lack of PNC, and hx of CPS involvement; MOB and FOB understood. CSW also offered MOB a referral for parenting and MOB was accepting.  CSW made a referral to the [employer]. CSW inquired about MOB's MH.  MOB reported that MOB receives SSI for her [name] and has a payee Shelby Serrano).  MOB sated that MOB was dx with depression and at this time is not seeing a counselor,  or regulated on medications.  CSW informed MOB that MOB could also receive [place] counseling through the [facility] if needed. CSW educated MOB about PPD. CSW informed MOB of possible supports and interventions to decrease PPD.  CSW also encouraged MOB to seek medical attention if needed for increased signs and symptoms for PPD. MOB and FOB communicated that they have everything they need for the infant, and they have a wealth of support from FOB's family. CSW thanked MOB for meeting with CSW and provided MOB with CSW contact information.  MOB did not have any additional questions or concerns at this time. CSW made a report with [place] worker, Wendall Stade. CPS will follow-up with CSW prior to MOB's dc.   CSW Plan/Description:  Child Copy Report , Patient/Family Education , Information/Referral to [employer]  (CPS report made with [facility] CPS; CPS will contact CSW prior to infant's dc.)    Shelby Serrano D BOYD-GILYARD, LCSW 07/16/2016, 3:41 PM

## 2016-07-17 MED ORDER — IBUPROFEN 600 MG PO TABS: 600.0000 mg | ORAL_TABLET | Freq: Four times a day (QID) | ORAL | 0 refills | Status: DC | PRN

## 2016-07-17 NOTE — Discharge Summary (Signed)
OB Discharge Summary     Patient Name: Shelby Serrano DOB: 1995/11/04 MRN: [id number]  Date of admission: 07/15/2016 Delivering MD: Starr Lake   Date of discharge: 07/17/2016  Admitting diagnosis: Active labor Intrauterine pregnancy: [redacted]w[redacted]d    Secondary diagnosis:  Active Problems:   Normal labor  Additional problems: none     Discharge diagnosis: Term Pregnancy Delivered                                                                                                Post partum procedures:none  Augmentation: AROM  Complications: None  Hospital course:  Onset of Labor With Vaginal Delivery     20y.o. yo [id number] 387w5das admitted in Active Labor on 07/15/2016. Patient had an uncomplicated labor course as follows:  Membrane Rupture Time/Date: 3:46 PM ,07/15/2016   Intrapartum Procedures: Episiotomy: None [1]                                         Lacerations:  1st degree [2];Periurethral [8]  Patient had a delivery of a Viable infant. 07/15/2016  Information for the patient's newborn:  JoCanyon, Willow0[[id number]]Delivery Method: Vaginal, Spontaneous Delivery (Filed from Delivery Summary)    Pateint had an uncomplicated postpartum course.  She met with the SW while hospitalized and a CPS case was initiated. She is ambulating, tolerating a regular diet, passing flatus, and urinating well. Patient is discharged home in stable condition on 07/17/16.    Physical exam Vitals:   07/16/16 0320 07/16/16 0920 07/16/16 1818 07/17/16 0606  BP: 104/63 107/69 100/69 102/71  Pulse: (!) 58 (!) 52 (!) 54 (!) 52  Resp: _0 Temp: 98.3 F (36.8 C) 97.8 F (36.6 C) 98.1 F (36.7 C) 97.9 F (36.6 C)  TempSrc: Oral Oral Oral Axillary  SpO2: 99%     Weight:      Height:       General: alert and cooperative Lochia: appropriate Uterine Fundus: firm Incision: N/A DVT Evaluation: No evidence of DVT seen on physical exam. Labs: Lab Results   Component Value Date   WBC 10.1 07/15/2016   HGB 11.5 (L) 07/15/2016   HCT 33.6 (L) 07/15/2016   MCV 78.1 07/15/2016   PLT 123 (L) 07/15/2016   CMP Latest Ref Rng & Units 05/04/2016  Glucose 65 - 99 mg/dL 74  BUN 6 - 20 mg/dL 6  Creatinine 0.44 - 1.00 mg/dL 0.74  Sodium 135 - 145 mmol/L 135  Potassium 3.5 - 5.1 mmol/L 3.5  Chloride 101 - 111 mmol/L 108  CO2 22 - 32 mmol/L 21(L)  Calcium 8.9 - 10.3 mg/dL 8.6(L)  Total Protein 6.5 - 8.1 g/dL 5.6(L)  Total Bilirubin 0.3 - 1.2 mg/dL 0.6  Alkaline Phos 38 - 126 U/L 114  AST 15 - 41 U/L 19  ALT 14 - 54 U/L 11(L)    Discharge instruction: per After Visit Summary and "Baby and Me Booklet".  After  visit meds:    Medication List    STOP taking these medications   TYLENOL 325 MG tablet Generic drug:  acetaminophen     TAKE these medications   ibuprofen 600 MG tablet Commonly known as:  ADVIL,MOTRIN Take 1 tablet (600 mg total) by mouth every 6 (six) hours as needed.   prenatal vitamin w/FE, FA 27-1 MG Tabs tablet Take 1 tablet by mouth daily.       Diet: routine diet  Activity: Advance as tolerated. Pelvic rest for 6 weeks.   Outpatient follow up:6 weeks Follow up Appt:Future Appointments Date Time Provider Department Center  08/23/2016 10:20 AM Kathryn Lorraine Kooistra, CNM [facility]   Follow up Visit:No Follow-up on file.  Postpartum contraception: undecided  Newborn Data: Live born female  Birth Weight: 6 lb 0.1 oz (2725 g) APGAR: 8, 9  Baby Feeding: Bottle Disposition:home with mother   07/17/2016 SHAW, KIMBERLY, CNM  8:29 AM    

## 2016-07-17 NOTE — Progress Notes (Signed)
[facility] [place] [facility] [place] social worker Shelby Serrano in an effort to follow-up on previous report made as MOB and baby are now ready for d/c. Shelby MostCharles notes that the report was screened out and there no concerns for abuse/neglect. Case closed to this CSW.   Shelby Serrano, Shelby Serrano, Shelby Serrano Clinical Social Worker  [facility]  Office: [phone]

## 2016-07-17 NOTE — Discharge Instructions (Signed)

## 2016-07-22 NOTE — Progress Notes (Signed)
CSW made a report to [place] CPS worker, Pam Miller, for infant's positive cord for THC.  CPS will follow up with MOB.   Jomo Forand Boyd-Gilyard, MSW, LCSW Clinical Social Work [phone]  

## 2016-08-23 ENCOUNTER — Ambulatory Visit (INDEPENDENT_AMBULATORY_CARE_PROVIDER_SITE_OTHER): Payer: Medicaid Other | Admitting: Student

## 2016-08-23 ENCOUNTER — Encounter: Payer: Self-pay | Admitting: Student

## 2016-08-23 NOTE — Patient Instructions (Signed)
Intrauterine Device Information Introduction An intrauterine device (IUD) is a medical device that gets inserted into the uterus to prevent pregnancy. It is a small, T-shaped device that has one or two nylon strings hanging down from it. The strings hang out of the lower part of the uterus (cervix) to allow for future IUD removal. There are two types of IUDs available:  Copper IUD. This type of IUD has copper wire wrapped around it. A copper IUD may last up to 10 years.  Hormone IUD. This type of IUD is made of plastic and contains the hormone progestin (synthetic progesterone). A hormone IUD may last 3 to 5 years. IUDs are inserted through the vagina and placed into the uterus with a minor medical procedure. How does the IUD work? Copper in the copper IUD prevents pregnancy by making the uterus and fallopian tubes produce a fluid that kills sperm. Synthetic progesterone in hormonal IUD prevents pregnancy by:  Thickening cervical mucus to prevent sperm from entering the uterus.  Thinning the uterine lining to prevent implantation of a fertilized egg.  Weakening or killing sperm that get into the uterus. What are the advantages of an IUD?  IUDs are highly effective, reversible, long-acting, and low-maintenance.  There are no estrogen-related side effects.  An IUD can be used when breastfeeding.  IUDs are not associated with weight gain.  Advantages of the copper IUD are that:  It works immediately after insertion.  It does not interfere with your body's natural hormones.  It can be used for 10 years.  Advantages of the hormone IUD are that:  If it is inserted within 7 days of your period starting, it works immediately after insertion. If the hormone IUD is inserted at any other time in your cycle, you will need to use a backup method of birth control for 7 days after insertion.  It can make menstrual periods lighter.  It can decrease menstrual cramping.  It can be used for 3  or 5 years. What are the disadvantages of an IUD?  The hormone IUD may cause irregular menstrual bleeding for a period of time after insertion.  The copper IUD can make your menstrual flow heavier and more painful.  You may experience some pain during insertion, and cramping and vaginal bleeding after insertion. How is the IUD removed? Is the IUD right for me?  Your health care provider will make sure you are a good candidate for an IUD and will discuss side effects with you. This information is not intended to replace advice given to you by your health care provider. Make sure you discuss any questions you have with your health care provider. Document Released: 08/03/2004 Document Revised: 02/05/2016 Document Reviewed: 02/18/2013  2017 Elsevier  

## 2016-08-23 NOTE — Progress Notes (Signed)
Subjective:     Shelby Serrano is a 20 y.o. female who presents for a postpartum visit. She is 5 weeks postpartum following a spontaneous vaginal delivery. I have fully reviewed the prenatal and intrapartum course. The delivery was at 38 gestational weeks. Outcome: spontaneous vaginal delivery. Anesthesia: epidural. Postpartum course has been uneventufl. Baby's course has been uneventful. Baby is feeding by bottle - formula. Bleeding no bleeding. Bowel function is normal. Bladder function is normal. Patient is sexually active. Contraception method is none. Postpartum depression screening: negative.    Review of Systems Pertinent items are noted in HPI.   Objective:    There were no vitals taken for this visit.  General:  alert, cooperative and no distress   Breasts:  inspection negative, no nipple discharge or bleeding, no masses or nodularity palpable  Lungs: clear to auscultation bilaterally  Heart:  regular rate and rhythm, S1, S2 normal, no murmur, click, rub or gallop  Abdomen: soft, non-tender; bowel sounds normal; no masses,  no organomegaly   Vulva:  not evaluated  Vagina: not evaluated  Cervix:  not evaluated  Corpus: not evaluated  Adnexa:  not evaluated  Rectal Exam: not evaluated        Assessment:    Normal  postpartum exam. Pap smear not done at today's visit.   Plan:    1. Contraception: none 2. Counseling about IUD and Nexplanon, patient understands the risks of unplanned pregnancy 3. Follow up in: PRN for LARC or as needed.    Shelby Serrano CNM

## 2016-09-21 ENCOUNTER — Ambulatory Visit: Payer: Self-pay | Admitting: Medical

## 2016-09-21 ENCOUNTER — Ambulatory Visit (INDEPENDENT_AMBULATORY_CARE_PROVIDER_SITE_OTHER): Payer: Medicaid Other | Admitting: General Practice

## 2016-09-21 DIAGNOSIS — Z30013 Encounter for initial prescription of injectable contraceptive: Secondary | ICD-10-CM

## 2016-09-21 DIAGNOSIS — Z3202 Encounter for pregnancy test, result negative: Secondary | ICD-10-CM | POA: Diagnosis not present

## 2016-09-21 LAB — POCT PREGNANCY, URINE: PREG TEST UR: NEGATIVE

## 2016-09-21 MED ORDER — MEDROXYPROGESTERONE ACETATE 150 MG/ML IM SUSP
150.0000 mg | Freq: Once | INTRAMUSCULAR | Status: AC
  Administered 2016-09-21: 150 mg via INTRAMUSCULAR

## 2016-09-23 ENCOUNTER — Telehealth: Payer: Self-pay | Admitting: General Practice

## 2016-09-23 NOTE — Telephone Encounter (Signed)
Patient called and left message requesting test results from pregnancy test. Patient states she started having heavy bleeding last night. Called patient, no answer- left message stating we are trying to reach you to return your phone call, please call us back

## 2016-09-27 NOTE — Telephone Encounter (Signed)
Called patient & informed her of results. Patient verbalized understanding & had no questions  

## 2016-10-05 ENCOUNTER — Ambulatory Visit: Payer: Self-pay | Admitting: Medical

## 2016-11-22 ENCOUNTER — Inpatient Hospital Stay (HOSPITAL_COMMUNITY)
Admission: AD | Admit: 2016-11-22 | Discharge: 2016-11-22 | Disposition: A | Discharge status: Home / Self Care | Payer: Medicaid Other | Source: Ambulatory Visit | Attending: Obstetrics and Gynecology | Admitting: Obstetrics and Gynecology

## 2016-11-22 ENCOUNTER — Encounter (HOSPITAL_COMMUNITY): Payer: Self-pay

## 2016-11-22 DIAGNOSIS — R109 Unspecified abdominal pain: Secondary | ICD-10-CM | POA: Diagnosis present

## 2016-11-22 DIAGNOSIS — N939 Abnormal uterine and vaginal bleeding, unspecified: Secondary | ICD-10-CM

## 2016-11-22 DIAGNOSIS — Z87891 Personal history of nicotine dependence: Secondary | ICD-10-CM | POA: Insufficient documentation

## 2016-11-22 DIAGNOSIS — K5900 Constipation, unspecified: Secondary | ICD-10-CM | POA: Diagnosis not present

## 2016-11-22 DIAGNOSIS — K5901 Slow transit constipation: Secondary | ICD-10-CM

## 2016-11-22 LAB — URINALYSIS, ROUTINE W REFLEX MICROSCOPIC
BILIRUBIN URINE: NEGATIVE
GLUCOSE, UA: NEGATIVE mg/dL
Ketones, ur: NEGATIVE mg/dL
LEUKOCYTES UA: NEGATIVE
NITRITE: NEGATIVE
Protein, ur: NEGATIVE mg/dL
SPECIFIC GRAVITY, URINE: 1.011 (ref 1.005–1.030)
pH: 5 (ref 5.0–8.0)

## 2016-11-22 LAB — CBC
HEMATOCRIT: 37.8 % (ref 36.0–46.0)
Hemoglobin: 13.1 g/dL (ref 12.0–15.0)
MCH: 28.9 pg (ref 26.0–34.0)
MCHC: 34.7 g/dL (ref 30.0–36.0)
MCV: 83.4 fL (ref 78.0–100.0)
Platelets: 238 10*3/uL (ref 150–400)
RBC: 4.53 MIL/uL (ref 3.87–5.11)
RDW: 15.2 % (ref 11.5–15.5)
WBC: 8 10*3/uL (ref 4.0–10.5)

## 2016-11-22 LAB — POCT PREGNANCY, URINE: PREG TEST UR: NEGATIVE

## 2016-11-22 MED ORDER — POLYETHYLENE GLYCOL 3350 17 G PO PACK: 17.0000 g | PACK | Freq: Every day | ORAL | 0 refills | Status: AC

## 2016-11-22 NOTE — Discharge Instructions (Signed)
Constipation, Adult °Constipation is when a person: °· Poops (has a bowel movement) fewer times in a week than normal. °· Has a hard time pooping. °· Has poop that is dry, hard, or bigger than normal. ° °Follow these instructions at home: °Eating and drinking ° °· Eat foods that have a lot of fiber, such as: °? Fresh fruits and vegetables. °? Whole grains. °? Beans. °· Eat less of foods that are high in fat, low in fiber, or overly processed, such as: °? French fries. °? Hamburgers. °? Cookies. °? Candy. °? Soda. °· Drink enough fluid to keep your pee (urine) clear or pale yellow. °General instructions °· Exercise regularly or as told by your doctor. °· Go to the restroom when you feel like you need to poop. Do not hold it in. °· Take over-the-counter and prescription medicines only as told by your doctor. These include any fiber supplements. °· Do pelvic floor retraining exercises, such as: °? Doing deep breathing while relaxing your lower belly (abdomen). °? Relaxing your pelvic floor while pooping. °· Watch your condition for any changes. °· Keep all follow-up visits as told by your doctor. This is important. °Contact a doctor if: °· You have pain that gets worse. °· You have a fever. °· You have not pooped for 4 days. °· You throw up (vomit). °· You are not hungry. °· You lose weight. °· You are bleeding from the anus. °· You have thin, pencil-like poop (stool). °Get help right away if: °· You have a fever, and your symptoms suddenly get worse. °· You leak poop or have blood in your poop. °· Your belly feels hard or bigger than normal (is bloated). °· You have very bad belly pain. °· You feel dizzy or you faint. °This information is not intended to replace advice given to you by your health care provider. Make sure you discuss any questions you have with your health care provider. °Document Released: 02/16/2008 Document Revised: 03/19/2016 Document Reviewed: 02/18/2016 °Elsevier Interactive Patient Education ©  2017 Elsevier Inc. ° °

## 2016-11-22 NOTE — MAU Provider Note (Signed)
History     CSN: [id number]  Arrival date and time: 11/22/16 4098   First Provider Initiated Contact with Patient 11/22/16 1949      Chief Complaint  Patient presents with  . Vaginal Bleeding  . Abdominal Pain   Patient is a 21 year old G4 P2 who presents with ongoing vaginal bleeding. She reports she received a dose of Depo-Provera for the first time in approximately 3 years at the end of January. She delivered a baby in November. She did not have any bleeding after her delivery until she received the Depo-Provera. Since she reports near daily bleeding requiring multiple pads. She reports feeling weak and tired most days. She denies any vaginal discharge. She does report some mild abdominal pain. She reports is worse in the left lower quadrant. She reports daily bowel movements that she does not have to strain to have.    OB History    Gravida Para Term Preterm AB Living   4 2 2   2 2    SAB TAB Ectopic Multiple Live Births   2     0 2      History reviewed. No pertinent past medical history.  History reviewed. No pertinent surgical history.  Family History  Problem Relation Age of Onset  . Diabetes Mother   . Hypertension Mother     Social History  Substance Use Topics  . Smoking status: Former Smoker    Types: Cigarettes    Quit date: 02/08/2016  . Smokeless tobacco: Never Used  . Alcohol use No    Allergies: No Known Allergies  Prescriptions Prior to Admission  Medication Sig Dispense Refill Last Dose  . ibuprofen (ADVIL,MOTRIN) 600 MG tablet Take 1 tablet (600 mg total) by mouth every 6 (six) hours as needed. (Patient not taking: Reported on 11/22/2016) 30 tablet 0 Not Taking at Unknown time  . prenatal vitamin w/FE, FA (PRENATAL 1 + 1) 27-1 MG TABS tablet Take 1 tablet by mouth daily. (Patient not taking: Reported on 08/23/2016) 30 each 10 Not Taking    Review of Systems  Constitutional: Negative for chills and fever.  HENT: Negative for congestion and  rhinorrhea.   Respiratory: Negative for cough and shortness of breath.   Cardiovascular: Negative for chest pain and palpitations.  Gastrointestinal: Positive for abdominal pain. Negative for abdominal distention, blood in stool, constipation, diarrhea, nausea and vomiting.  Genitourinary: Negative for difficulty urinating, dysuria and flank pain.  Neurological: Positive for dizziness and weakness.   Physical Exam   Blood pressure 119/89, pulse 89, temperature 97.9 F (36.6 C), temperature source Oral, height 5\' 1"  (1.549 m), weight 110 lb 12 oz (50.2 kg), SpO2 99 %, not currently breastfeeding.  Physical Exam  Constitutional: She is oriented to person, place, and time. She appears well-developed and well-nourished.  HENT:  Head: Normocephalic and atraumatic.  Eyes: Conjunctivae are normal. Pupils are equal, round, and reactive to light.  Neck: Normal range of motion. Neck supple. No thyromegaly present.  Cardiovascular: Normal rate and intact distal pulses.   Respiratory: Effort normal. No respiratory distress.  GI: Soft. She exhibits no distension. There is no tenderness. There is no rebound and no guarding.  Neurological: She is alert and oriented to person, place, and time.  Skin: Skin is warm and dry.  Psychiatric: She has a normal mood and affect. Her behavior is normal.    MAU Course  Procedures  MDM In MA U patient underwent evaluation with CBC which was unremarkable showing a hemoglobin  of 13 no white blood cell count.   Given the patient did not have a normal period following delivery before she received Depo-Provera it is likely that this ongoing bleeding is secondary to normal. In the setting of her first Depo-Provera shot. Discussed with patient that her hemoglobin is very stable at this point and in fact has improved since delivery.  Patient is noted to be constipated on exam and I did recommend a bowel regimen.  Assessment and Plan  #1: Vaginal bleeding in the  setting of Depo-Provera continue current course of treatment. Recommend patient get second Depo-Provera shot when due and likely bleeding we'll begin to taper off. #2: Constipation recommended MiraLAX daily for 1 week.  Ernestina Pennaicholas Sheria Rosello 11/22/2016, 8:20 PM

## 2016-11-22 NOTE — MAU Note (Addendum)
Pt received birth control shot of Depo on January 27th. Pt states starting February 1st pt has been bleeding on and off since then. Pt c/o abdominal pain since she received the shot. Pt c/o numbness and pain on and off in her arm, mostly in the right arm. Pt c/o pain in her head that she states is not a headache. Pt states she goes through a pack of pads in three days. Pt states some days she goes through up to 15 pads due to the bleeding.

## 2016-12-07 ENCOUNTER — Ambulatory Visit (INDEPENDENT_AMBULATORY_CARE_PROVIDER_SITE_OTHER): Payer: Medicaid Other | Admitting: General Practice

## 2016-12-07 VITALS — BP 105/65 | HR 67 | Ht 61.0 in | Wt 114.6 lb

## 2016-12-07 DIAGNOSIS — Z3042 Encounter for surveillance of injectable contraceptive: Secondary | ICD-10-CM

## 2016-12-07 MED ORDER — MEDROXYPROGESTERONE ACETATE 150 MG/ML IM SUSP
150.0000 mg | Freq: Once | INTRAMUSCULAR | Status: AC
  Administered 2016-12-07: 150 mg via INTRAMUSCULAR

## 2017-01-04 ENCOUNTER — Ambulatory Visit: Payer: Self-pay | Admitting: Student

## 2017-02-23 ENCOUNTER — Ambulatory Visit: Payer: Self-pay

## 2017-03-01 ENCOUNTER — Inpatient Hospital Stay (HOSPITAL_COMMUNITY)
Admission: AD | Admit: 2017-03-01 | Discharge: 2017-03-01 | Discharge status: Against Medical Advice | Payer: Medicaid Other | Source: Ambulatory Visit | Attending: Family Medicine | Admitting: Family Medicine

## 2017-03-01 ENCOUNTER — Encounter (HOSPITAL_COMMUNITY): Payer: Self-pay

## 2017-03-01 DIAGNOSIS — Z5321 Procedure and treatment not carried out due to patient leaving prior to being seen by health care provider: Secondary | ICD-10-CM | POA: Diagnosis present

## 2017-03-01 LAB — URINALYSIS, ROUTINE W REFLEX MICROSCOPIC
BACTERIA UA: NONE SEEN
Bilirubin Urine: NEGATIVE
Glucose, UA: NEGATIVE mg/dL
Ketones, ur: 5 mg/dL — AB
Leukocytes, UA: NEGATIVE
Nitrite: NEGATIVE
PROTEIN: NEGATIVE mg/dL
SPECIFIC GRAVITY, URINE: 1.013 (ref 1.005–1.030)
pH: 5 (ref 5.0–8.0)

## 2017-03-01 LAB — POCT PREGNANCY, URINE: PREG TEST UR: NEGATIVE

## 2017-03-01 NOTE — MAU Note (Signed)
Pt not in lobby x1 

## 2017-03-01 NOTE — MAU Note (Signed)
Not in lobby x2.

## 2017-03-01 NOTE — MAU Note (Signed)
Pt not in lobby x3 

## 2017-03-01 NOTE — MAU Note (Signed)
Pt reports vaginal bleeding that started this morning. States she is on Depo and that periods have gotten heavier since starting. States she soaked through a pad and tampon this morning. States she has changed regular sized tampon 15 times. Pt has abdominal cramping.

## 2017-03-14 ENCOUNTER — Encounter (HOSPITAL_COMMUNITY): Payer: Self-pay | Admitting: *Deleted

## 2017-03-14 ENCOUNTER — Emergency Department (HOSPITAL_COMMUNITY)
Admission: EM | Admit: 2017-03-14 | Discharge: 2017-03-14 | Discharge status: Against Medical Advice | Payer: Medicaid Other | Attending: Emergency Medicine | Admitting: Emergency Medicine

## 2017-03-14 DIAGNOSIS — Z5321 Procedure and treatment not carried out due to patient leaving prior to being seen by health care provider: Secondary | ICD-10-CM | POA: Diagnosis not present

## 2017-03-14 DIAGNOSIS — M542 Cervicalgia: Secondary | ICD-10-CM | POA: Diagnosis not present

## 2017-03-14 MED ORDER — IBUPROFEN 400 MG PO TABS
400.0000 mg | ORAL_TABLET | Freq: Once | ORAL | Status: AC | PRN
  Administered 2017-03-14: 400 mg via ORAL
  Filled 2017-03-14: qty 1
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
## 2017-03-14 NOTE — ED Notes (Signed)
Patient up to desk while this RN receiving report.  Asking to have c-collar removed.  This RN and Herbalistcarlett RN explained we are unable to remove until patient is cleared by a physician.  Patient began to panic and pull at collar and yelling "get this thing off of my neck!".  This RN explained to patient how to remove herself, patient clawing at c-collar, spinning collar around, stating she needs to get it off so she can go home.  Pt demanding this RN to remove collar, this RN attempted to explain rationale for collar, patient continues to yell "get it off of me" and clawing at collar.  This RN removed collar and will d/c patient.

## 2017-08-08 IMAGING — US US MFM UA CORD DOPPLER
1 series · 14 of 28 positions shown · non-contrast
Comparison: none

[Series 1: us mfm ua cord doppler · 67 acquisitions, 14 frames shown]
[im 3/67]
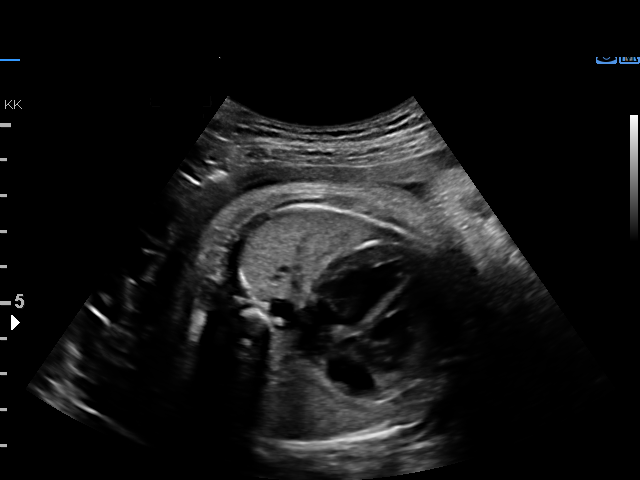
[im 8/67]
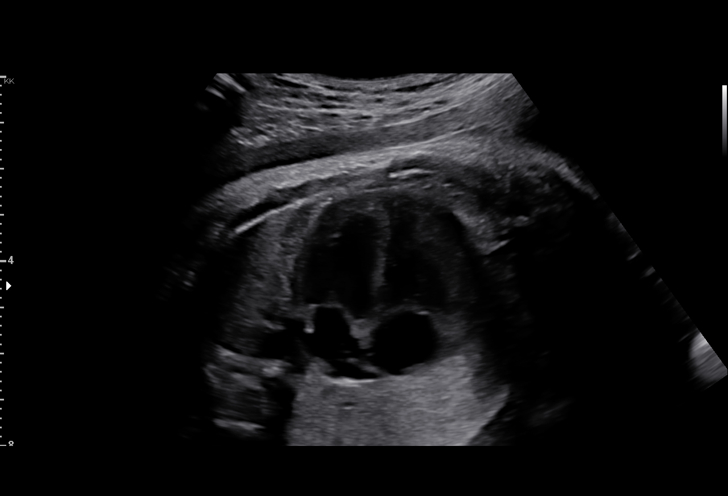
[im 13/67]
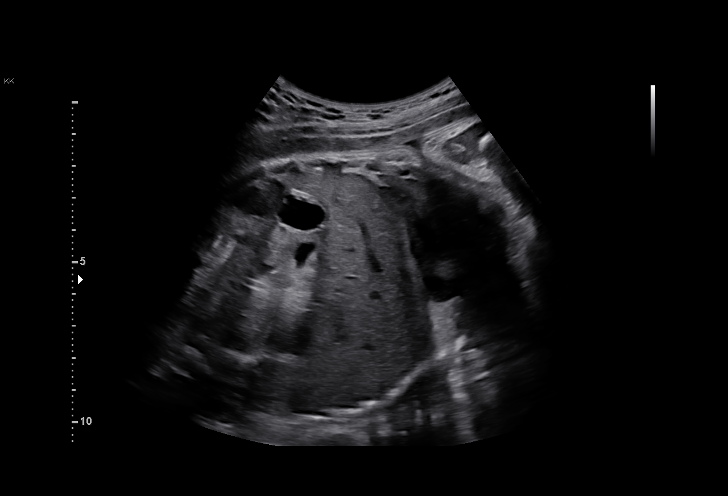
[im 18/67]
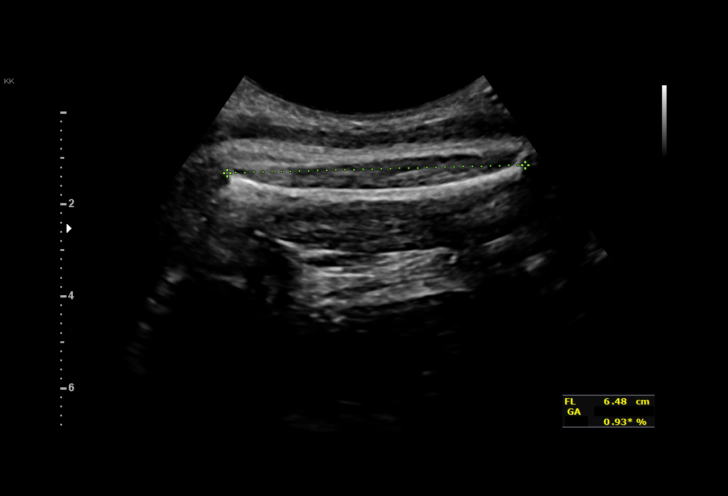
[im 23/67]
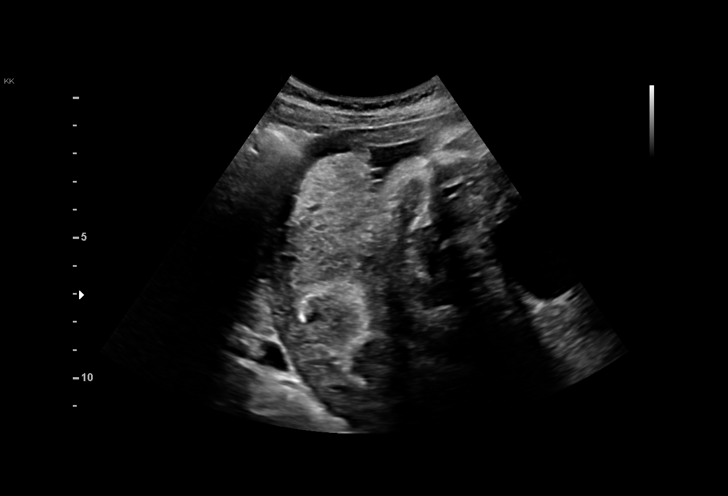
[im 27/67]
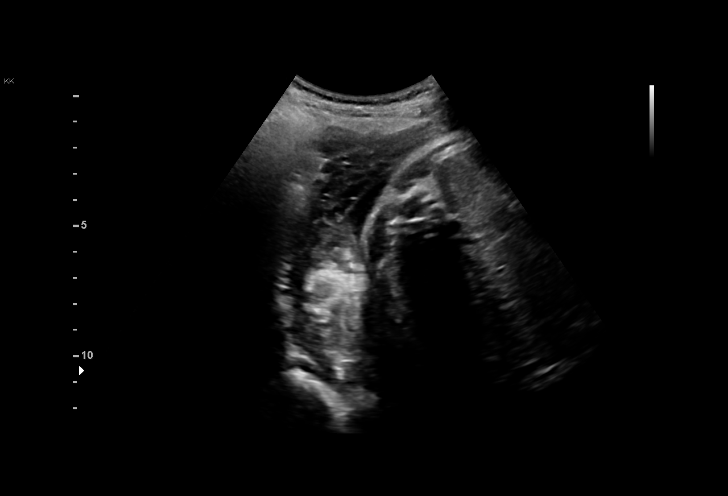
[im 32/67]
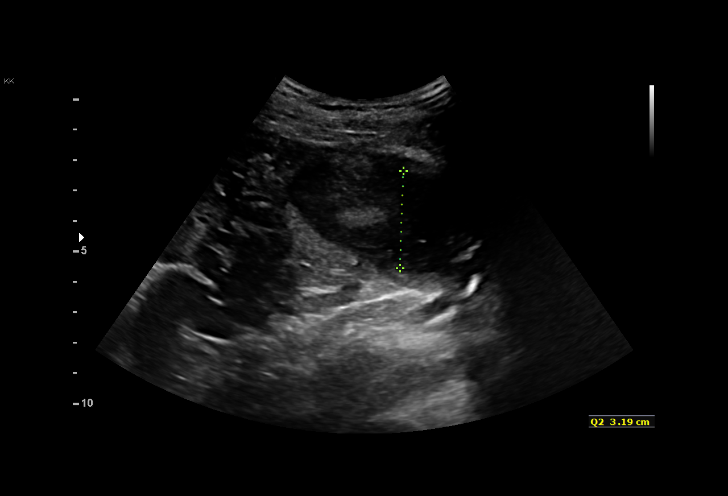
[im 37/67]
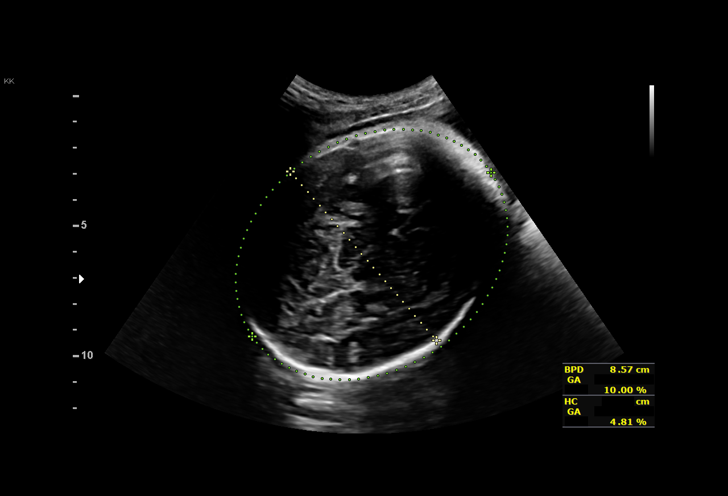
[im 42/67]
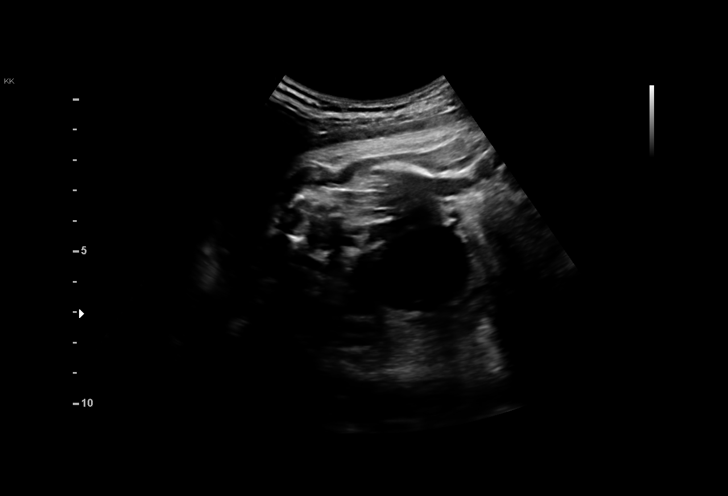
[im 47/67]
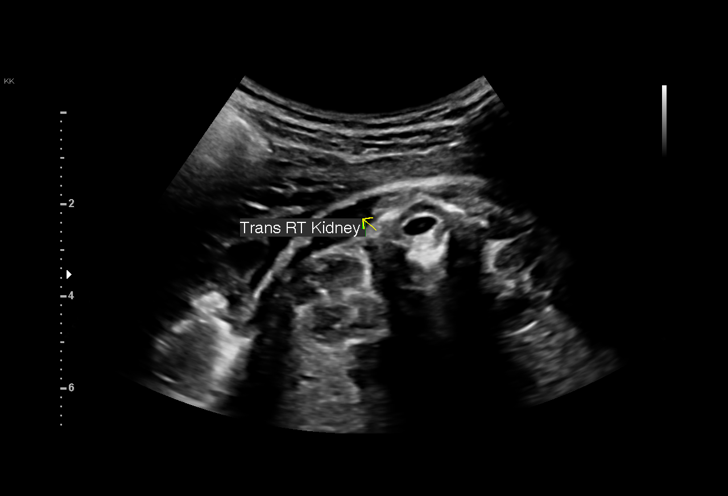
[im 52/67]
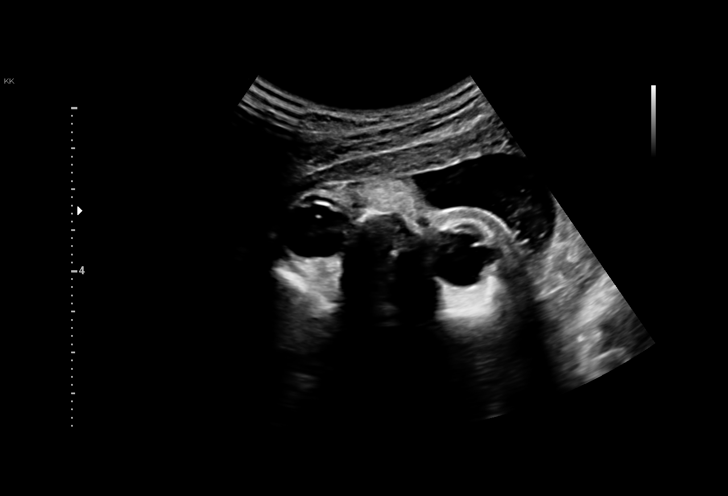
[im 57/67]
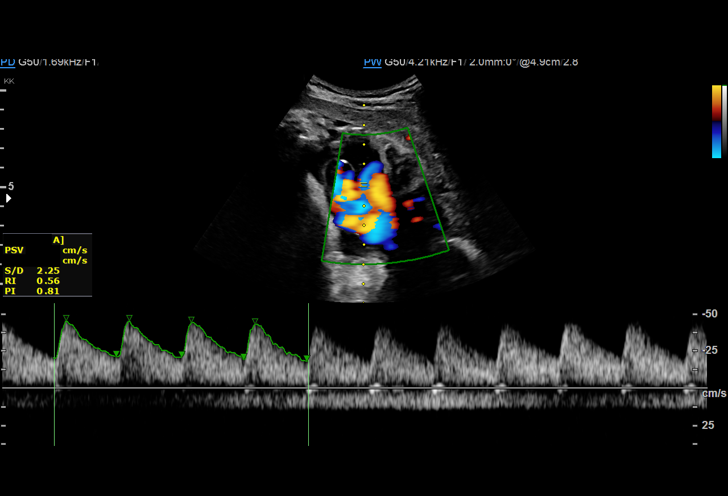
[im 62/67]
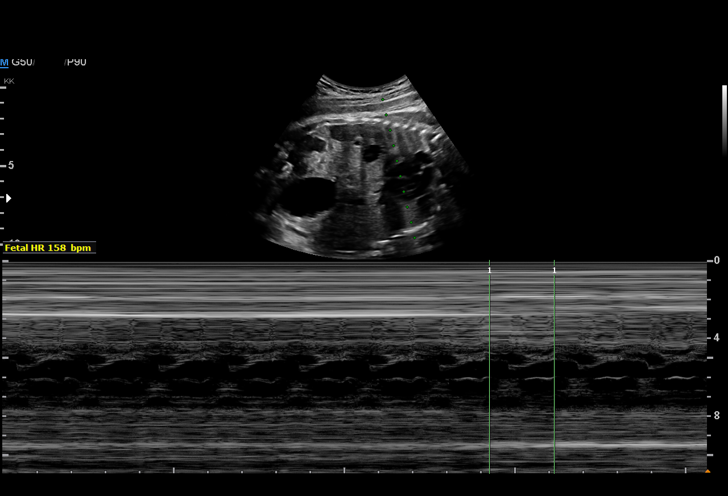
[im 67/67]
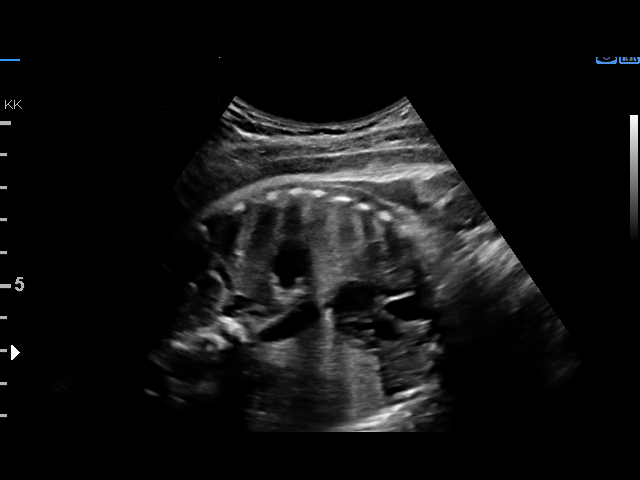

[14 of 28 positions shown; findings below may reference images not displayed]

[facility]
08

1  ZELMA MIESES          [id number]      [id number]     [id number]
2  ZELMA MIESES          [id number]      [id number]     [id number]
3  ZELMA MIESES          [id number]      [id number]     [id number]
Indications

36 weeks gestation of pregnancy
Teen pregnancy
Uterine size-date discrepancy, third trimester
OB History

Blood Type:            Height:  5'0"   Weight (lb):  109      BMI:
Gravidity:    4         Term:   0        Prem:   0        SAB:   2
TOP:          0       Ectopic:  0        Living: 1
Fetal Evaluation

Num Of Fetuses:     1
Fetal Heart         134
Rate(bpm):
Cardiac Activity:   Observed
Presentation:       Cephalic
Placenta:           Posterior, above cervical os
P. Cord Insertion:  Previously Visualized
Amniotic Fluid
AFI FV:      Subjectively within normal limits

AFI Sum(cm)     %Tile       Largest Pocket(cm)
16.73           63

RUQ(cm)       RLQ(cm)       LUQ(cm)        LLQ(cm)
3.71
Biophysical Evaluation

Amniotic F.V:   Pocket => 2 cm two         F. Tone:        Observed
planes
F. Movement:    Observed                   N.S.T:          Reactive
F. Breathing:   Not Observed               Score:          [DATE]
Biometry

BPD:      86.4  mm     G. Age:  34w 6d         15  %    CI:        74.84   %   70 - 86
FL/HC:      20.7   %   20.8 -
HC:      316.9  mm     G. Age:  35w 4d          8  %    HC/AC:      1.04       0.92 -
AC:      304.8  mm     G. Age:  34w 3d          9  %    FL/BPD:     75.8   %   71 - 87
FL:       65.5  mm     G. Age:  33w 5d        < 3  %    FL/AC:      21.5   %   20 - 24

Est. FW:    5656  gm      5 lb 6 oz     18  %
Gestational Age

LMP:           40w 4d       Date:   09/21/15                 EDD:   06/27/16
U/S Today:     34w 5d                                        EDD:   08/07/16
Best:          36w 5d    Det. By:   U/S  (03/22/16)          EDD:   07/24/16
Anatomy

Cranium:               Appears normal         Aortic Arch:            Appears normal
Cavum:                 Previously seen        Ductal Arch:            Previously seen
Ventricles:            Appears normal         Diaphragm:              Appears normal
Choroid Plexus:        Not well visualized    Stomach:                Appears normal, left
sided
Cerebellum:            Appears normal         Abdomen:                Appears normal
Posterior Fossa:       Appears normal         Abdominal Wall:         Previously seen
Nuchal Fold:           Not applicable (>20    Cord Vessels:           Previously seen
wks GA)
Face:                  Orbits and profile     Kidneys:                Appear normal
previously seen
Lips:                  Appears normal         Bladder:                Appears normal
Thoracic:              Appears normal         Spine:                  Previously seen
Heart:                 Appears normal         Upper Extremities:      Previously seen
(4CH, axis, and situs
RVOT:                  Previously seen        Lower Extremities:      Previously seen
LVOT:                  Appears normal

Other:  Female gender. Heels and 5th digit previously visualized. Technically
difficult due to fetal position.
Doppler - Fetal Vessels

Umbilical Artery
S/D     %tile     RI              PI              PSV    ADFV    RDFV
(cm/s)
2.05       30   0.51             0.71             46.49      No      No
Impression

SIUP at 36+5 weeks
Cephalic presentation
Normal interval anatomy; anatomic survey complete
Normal amniotic fluid volume
EFW at the 18th %tile; AC at the 9th %tile
BPP [DATE] (-2 for BM)
UA dopplers were normal for this GA (30th %tile)

Fetus is most likely constitutionally small (mother tiny).
Recommendations

Continue twice weekly NSTs with weekly AFIs or weekly BPPs
Deliver by EDC

## 2017-08-25 ENCOUNTER — Inpatient Hospital Stay (HOSPITAL_COMMUNITY)
Admission: AD | Admit: 2017-08-25 | Discharge: 2017-08-25 | Disposition: A | Discharge status: Home / Self Care | Payer: Medicaid Other | Source: Ambulatory Visit | Attending: Family Medicine | Admitting: Family Medicine

## 2017-08-25 ENCOUNTER — Encounter (HOSPITAL_COMMUNITY): Payer: Self-pay | Admitting: *Deleted

## 2017-08-25 DIAGNOSIS — N938 Other specified abnormal uterine and vaginal bleeding: Secondary | ICD-10-CM | POA: Diagnosis not present

## 2017-08-25 DIAGNOSIS — F17210 Nicotine dependence, cigarettes, uncomplicated: Secondary | ICD-10-CM | POA: Insufficient documentation

## 2017-08-25 DIAGNOSIS — R109 Unspecified abdominal pain: Secondary | ICD-10-CM | POA: Diagnosis present

## 2017-08-25 DIAGNOSIS — Z716 Tobacco abuse counseling: Secondary | ICD-10-CM

## 2017-08-25 DIAGNOSIS — Z3202 Encounter for pregnancy test, result negative: Secondary | ICD-10-CM | POA: Insufficient documentation

## 2017-08-25 LAB — URINALYSIS, ROUTINE W REFLEX MICROSCOPIC
Bacteria, UA: NONE SEEN
Bilirubin Urine: NEGATIVE
Glucose, UA: NEGATIVE mg/dL
Ketones, ur: NEGATIVE mg/dL
Leukocytes, UA: NEGATIVE
Nitrite: NEGATIVE
Protein, ur: NEGATIVE mg/dL
Specific Gravity, Urine: 1.008 (ref 1.005–1.030)
pH: 5 (ref 5.0–8.0)

## 2017-08-25 LAB — WET PREP, GENITAL
Clue Cells Wet Prep HPF POC: NONE SEEN
Sperm: NONE SEEN
Trich, Wet Prep: NONE SEEN
Yeast Wet Prep HPF POC: NONE SEEN

## 2017-08-25 LAB — POCT PREGNANCY, URINE: PREG TEST UR: NEGATIVE

## 2017-08-25 NOTE — MAU Note (Signed)
Pt stated she had her period 2 weeks ago.  Started bleeding again on Tuesday. Flow is heavy. Had been on depo but can't remember the last time she had a shot. Periods have been irregular.
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
## 2017-08-25 NOTE — MAU Provider Note (Signed)
Chief Complaint: Vaginal Bleeding and Abdominal Pain   First Provider Initiated Contact with Patient 08/25/17 1059      SUBJECTIVE HPI: Shelby Serrano is a 21 y.o. [id number] not currently pregnant who presents to maternity admissions reporting cramping and vaginal bleeding. Abdominal cramping is only when she is on her period, currently on cycle, rates pain 4/10 has not taken any medication for cramping. She reports that she was on her period two weeks ago and started back on her cycle this past Thursday. States cycles are irregular since stopping her Depo injections- last depo was in March of 2018. Bleeding is heavy with some clots. Reports [facility] but had unprotected sex last Tuesday. She wants to be back on birth control, but does not want depo wants pills. She denies vaginal itching/burning, urinary symptoms, h/a, dizziness, vomiting today, or fever/chills.    History reviewed. No pertinent past medical history. History reviewed. No pertinent surgical history. Social History   Socioeconomic History  . Marital status: Single    Spouse name: Not on file  . Number of children: Not on file  . Years of education: Not on file  . Highest education level: Not on file  Social Needs  . Financial resource strain: Not on file  . Food insecurity - worry: Not on file  . Food insecurity - inability: Not on file  . Transportation needs - medical: Not on file  . Transportation needs - non-medical: Not on file  Occupational History  . Not on file  Tobacco Use  . Smoking status: Current Every Day Smoker    Packs/day: 1.00    Types: Cigarettes    Last attempt to quit: 02/08/2016    Years since quitting: 1.5  . Smokeless tobacco: Never Used  Substance and Sexual Activity  . Alcohol use: No  . Drug use: Yes    Types: Marijuana    Comment: Patient denies, [facility]  . Sexual activity: Yes    Birth control/protection: Injection  Other Topics  Concern  . Not on file  Social History Narrative  . Not on file   No current facility-administered medications on file prior to encounter.    No current outpatient medications on file prior to encounter.   No Known Allergies  ROS:  Review of Systems  Constitutional: Negative.   Respiratory: Negative.   Cardiovascular: Negative.   Gastrointestinal: Positive for abdominal pain and nausea. Negative for constipation, diarrhea and vomiting.  Genitourinary: Positive for vaginal bleeding. Negative for difficulty urinating, frequency, pelvic pain, urgency and vaginal discharge.  Musculoskeletal: Negative.   Neurological: Negative.   Psychiatric/Behavioral: Negative.     I have reviewed patient's Past Medical Hx, Surgical Hx, Family Hx, Social Hx, medications and allergies.   Physical Exam   Patient Vitals for the past 24 hrs:  BP Temp Temp src Pulse Resp Height Weight  08/25/17 1031 118/63 (!) 97.5 F (36.4 C) Oral 79 18 5\' 1"  (1.549 m) 108 lb (49 kg)   Constitutional: Well-developed, well-nourished female in no acute distress.  Cardiovascular: normal rate Respiratory: normal effort GI: Abd soft, non-tender. Pos BS x 4 MS: Extremities nontender, no edema, normal ROM Neurologic: Alert and oriented x 4.  GU: Neg CVAT.  PELVIC EXAM: Cervix pink, visually closed, without lesion, moderate dark red bleeding, vaginal walls and external genitalia normal Bimanual exam: Cervix 0/long/high, firm, anterior, neg CMT, uterus nontender, nonenlarged, adnexa without tenderness, enlargement, or mass  LAB RESULTS Results  for orders placed or performed during the hospital encounter of 08/25/17 (from the past 24 hour(s))  Urinalysis, Routine w reflex microscopic     Status: Abnormal   Collection Time: 08/25/17 10:25 AM  Result Value Ref Range   Color, Urine STRAW (A) YELLOW   APPearance CLEAR CLEAR   Specific Gravity, Urine 1.008 1.005 - 1.030   pH 5.0 5.0 - 8.0   Glucose, UA NEGATIVE NEGATIVE  mg/dL   Hgb urine dipstick MODERATE (A) NEGATIVE   Bilirubin Urine NEGATIVE NEGATIVE   Ketones, ur NEGATIVE NEGATIVE mg/dL   Protein, ur NEGATIVE NEGATIVE mg/dL   Nitrite NEGATIVE NEGATIVE   Leukocytes, UA NEGATIVE NEGATIVE   RBC / HPF 0-5 0 - 5 RBC/hpf   WBC, UA 0-5 0 - 5 WBC/hpf   Bacteria, UA NONE SEEN NONE SEEN   Squamous Epithelial / LPF 0-5 (A) NONE SEEN  Pregnancy, urine POC     Status: None   Collection Time: 08/25/17 10:43 AM  Result Value Ref Range   Preg Test, Ur NEGATIVE NEGATIVE  Wet prep, genital     Status: Abnormal   Collection Time: 08/25/17 11:08 AM  Result Value Ref Range   Yeast Wet Prep HPF POC NONE SEEN NONE SEEN   Trich, Wet Prep NONE SEEN NONE SEEN   Clue Cells Wet Prep HPF POC NONE SEEN NONE SEEN   WBC, Wet Prep HPF POC FEW (A) NONE SEEN   Sperm NONE SEEN      MAU Management/MDM: Ordered labs and reviewed results.  Urinalysis  UPT  Wet prep  GC/C Educated on birth control options that are safe while smoking and not. Discussed going back on depo or IUD. She declines depo and IUD. Patient wants birth control pills and agrees to stop smoking so that she can be on birth control pills.  Pt discharged with smoking cessation paperwork and instructions to follow up in clinic in 1-2 months for birth control.   ASSESSMENT 1. DUB (dysfunctional uterine bleeding)   2. Encounter for smoking cessation counseling   3. Abdominal cramping    PLAN Discharge home Follow up in clinic for birth control pills once she stops smoking.    Allergies as of 08/25/2017   No Known Allergies     Medication List    You have not been prescribed any medications.    Steward DroneVeronica Dangela How  Certified Nurse-Midwife 08/25/2017  11:16 AM

## 2017-08-26 LAB — GC/CHLAMYDIA PROBE AMP (~~LOC~~) NOT AT ARMC
Chlamydia: NEGATIVE
Neisseria Gonorrhea: NEGATIVE

## 2017-09-13 IMAGING — US US OB COMP LESS 14 WK
1 series · 13 of 28 positions shown · non-contrast
Comparison: 05/26/2015

CLINICAL DATA: Lower abdominal pain during pregnancy. Estimated
gestational age by LMP is 5 weeks 4 days. Quantitative beta HCG is
not provided.

EXAM:
OBSTETRIC <14 WK US AND TRANSVAGINAL OB US
TECHNIQUE: Both transabdominal and transvaginal ultrasound examinations were
performed for complete evaluation of the gestation as well as the
maternal uterus, adnexal regions, and pelvic cul-de-sac.
Transvaginal technique was performed to assess early pregnancy.

[Series 1: us ob comp less 14 wk · 0.20mm/px · 13 of 71 slices shown]
[im 3/71]
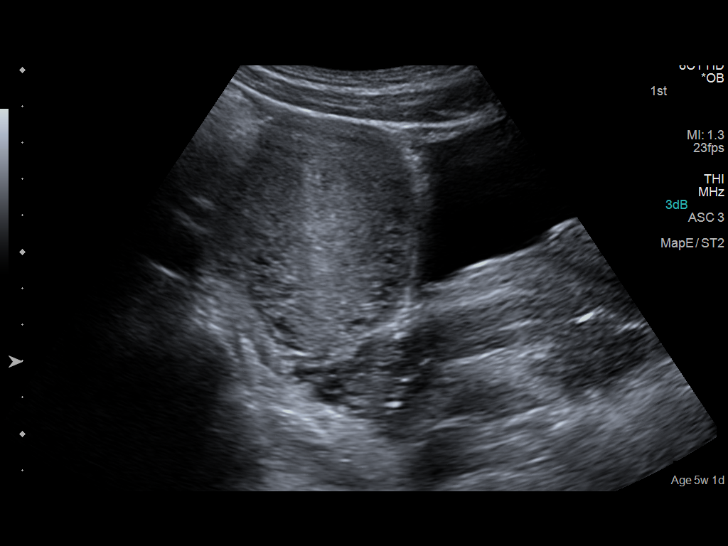
[im 8/71]
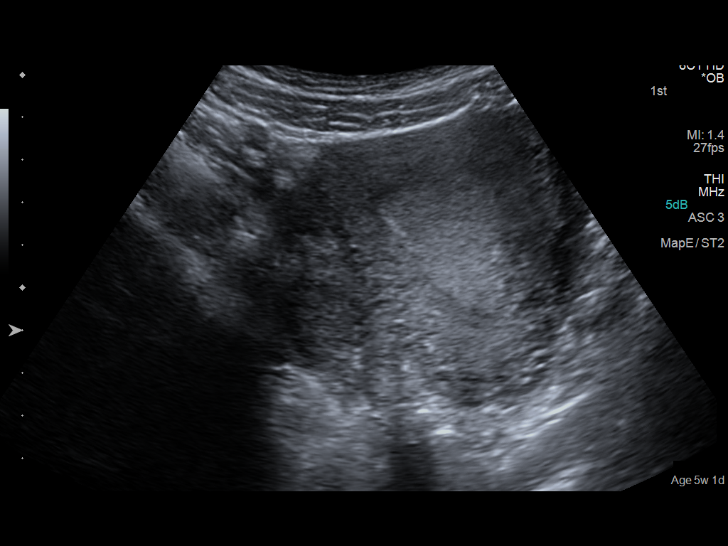
[im 13/71]
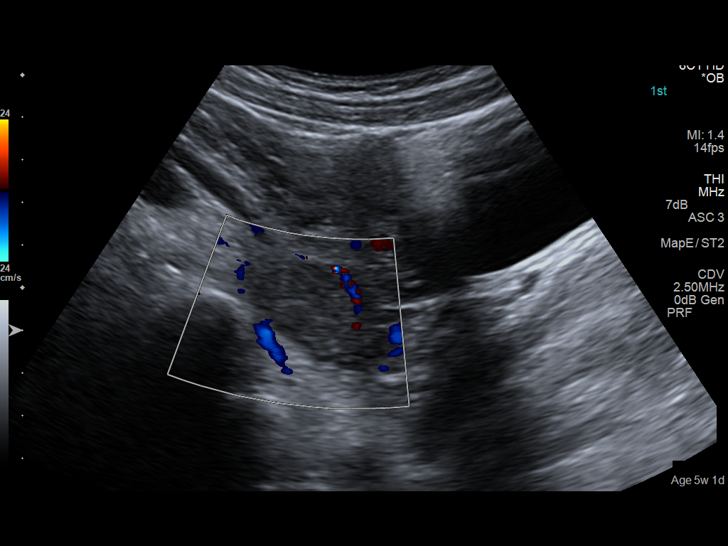
[im 19/71]
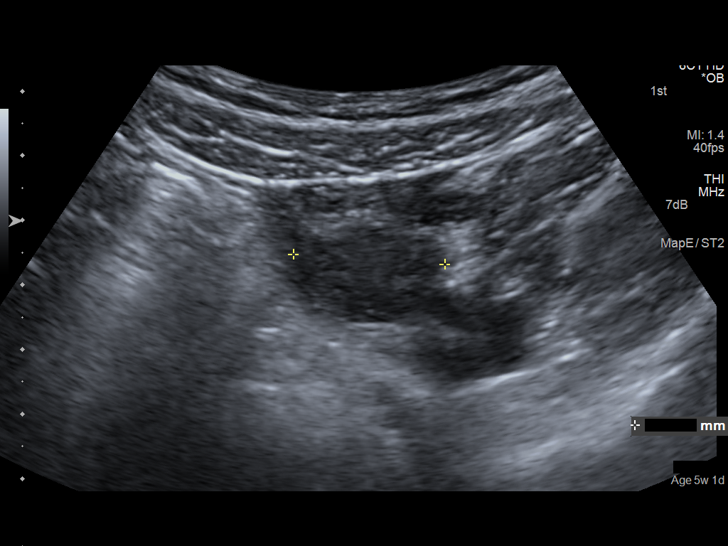
[im 24/71]
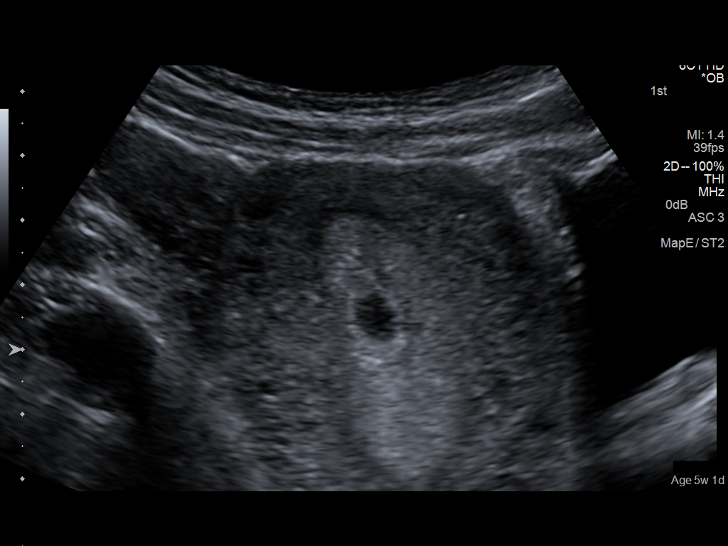
[im 29/71]
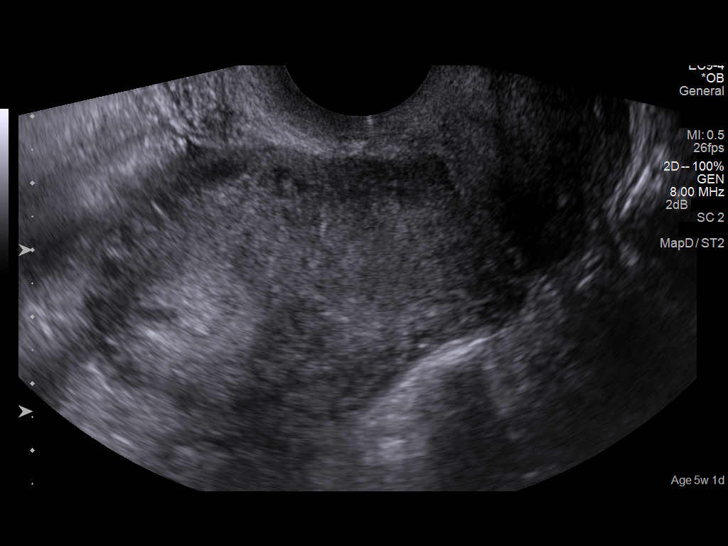
[im 37/71]
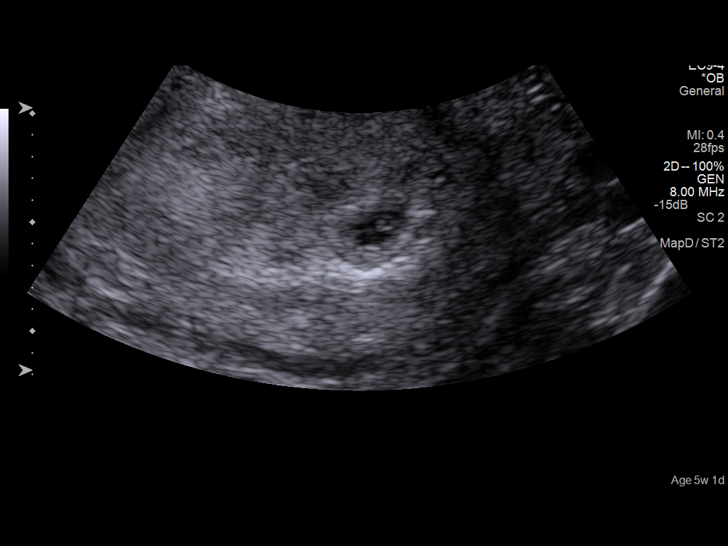
[im 42/71]
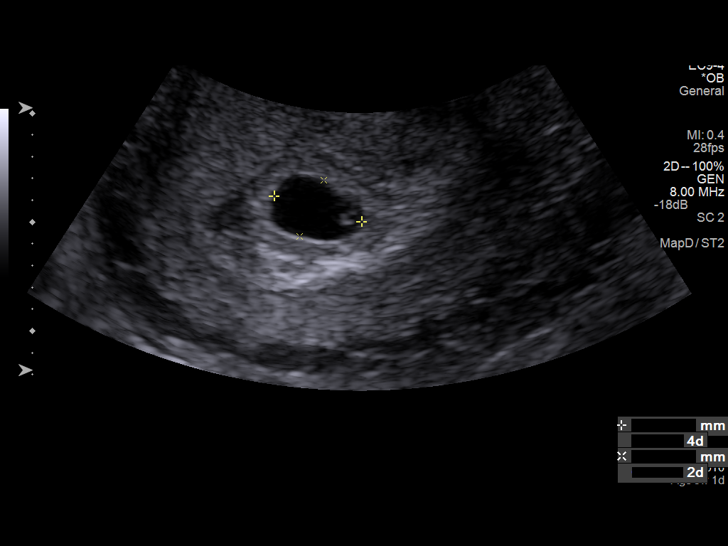
[im 47/71]
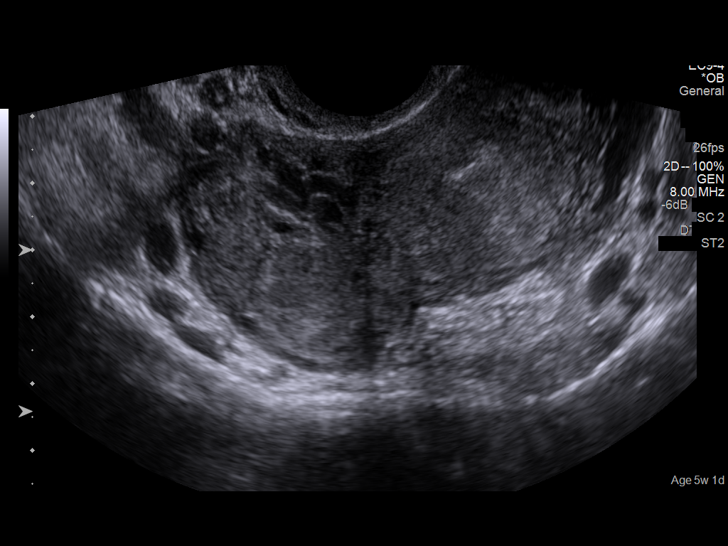
[im 52/71]
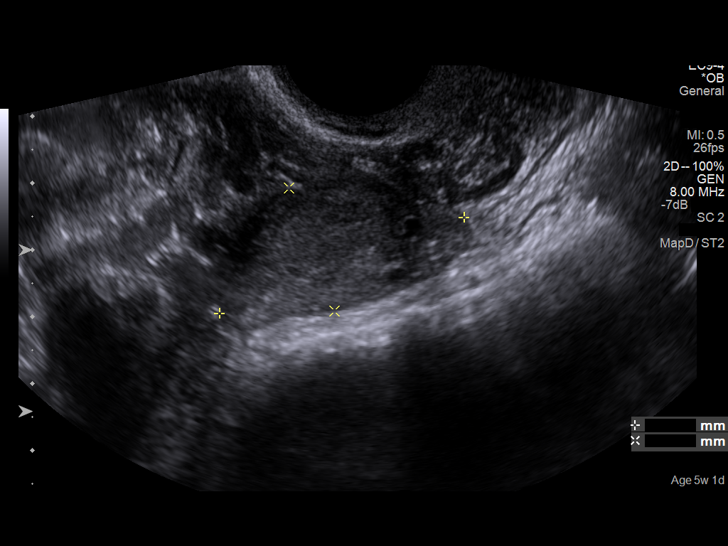
[im 58/71]
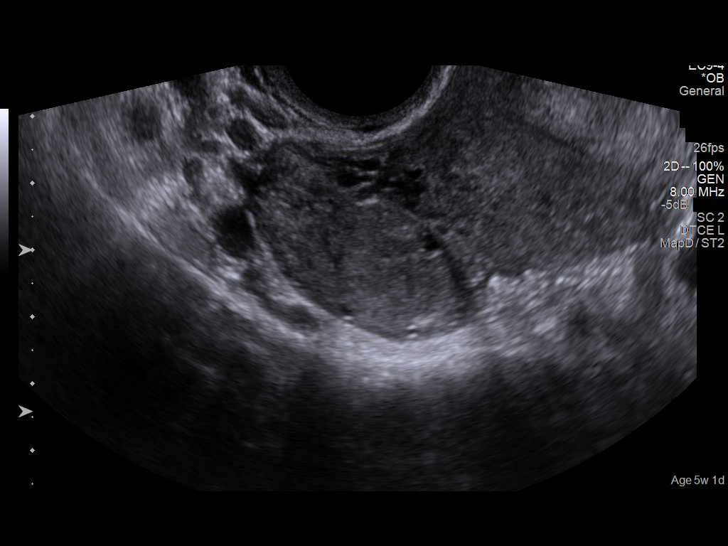
[im 63/71]
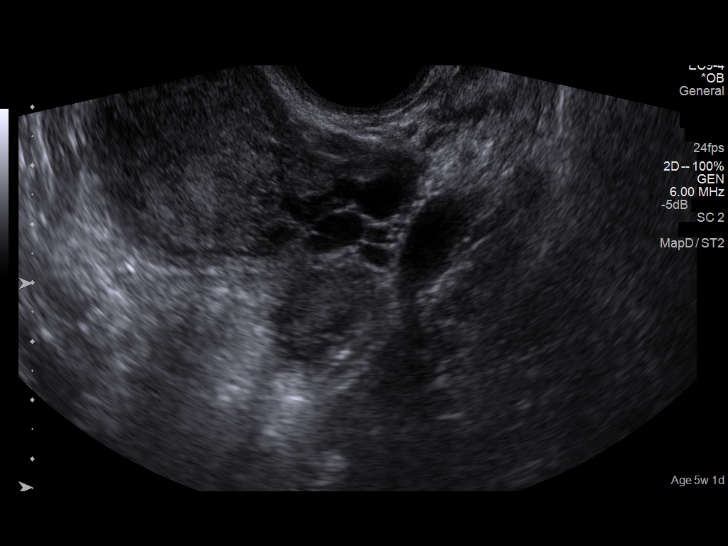
[im 68/71]
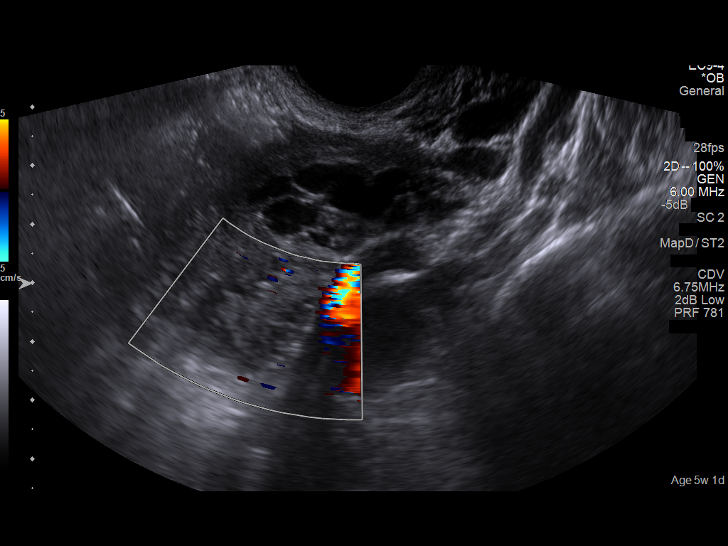

[13 of 28 positions shown; findings below may reference images not displayed]

FINDINGS: Intrauterine gestational sac: A single intrauterine gestational sac
is visualized.

Yolk sac:  Yolk sac is present.

Embryo:  Fetal pole is not identified.

Cardiac Activity: Not identified.

MSD: 7.5  mm   5 w   4  d

Maternal uterus/adnexae: Uterus is anteverted. No focal myometrial
masses. No subchorionic hemorrhage. Both ovaries are visualized and
appear normal. Involuting corpus luteal cysts is suggested on the
right. No free pelvic fluid collections.
IMPRESSION: Probable early intrauterine gestational sac with yolk sac, but no
fetal pole or cardiac activity yet visualized. Recommend follow-up
quantitative B-HCG levels and follow-up US in 14 days to confirm and
assess viability. This recommendation follows SRU consensus
guidelines: Diagnostic Criteria for Nonviable Pregnancy Early in the
First Trimester. N Engl J Med 3195; [DATE].

## 2017-09-13 NOTE — L&D Delivery Note (Addendum)
Delivery Note Presented to MAU with active labor, unable to get her down to labor unit.  At 8:08 AM a viable and healthy female was delivered via Vaginal, Spontaneous (Presentation:OA en caul).  APGAR: 9, 9; weight  .   Placenta status: spontaneous and grossly intact with 3 vessel Cord:  with the following complications: .trailing membranes that had to be removed with sponge stick.  Anesthesia:  none Episiotomy: None Lacerations:  none Suture Repair: none Est. Blood Loss (mL): 600  Mom to postpartum.  Baby to Couplet care / Skin to Skin.  Shelby Serrano 08/17/2018, 9:04 AM  Please schedule this patient for Postpartum visit in: 4 weeks with the following provider: Any provider For C/S patients schedule nurse incision check in weeks 2 weeks: no Low risk pregnancy complicated by: none Delivery mode:  SVD Anticipated Birth Control:  Depo PP Procedures needed: none  Schedule Integrated BH visit: no

## 2017-10-13 ENCOUNTER — Ambulatory Visit (INDEPENDENT_AMBULATORY_CARE_PROVIDER_SITE_OTHER): Payer: Medicaid Other | Admitting: Student

## 2017-10-13 ENCOUNTER — Inpatient Hospital Stay (HOSPITAL_COMMUNITY): Admit: 2017-10-13 | Payer: Self-pay

## 2017-10-13 ENCOUNTER — Encounter: Payer: Self-pay | Admitting: Student

## 2017-10-13 ENCOUNTER — Other Ambulatory Visit (HOSPITAL_COMMUNITY)
Admission: RE | Admit: 2017-10-13 | Discharge: 2017-10-13 | Disposition: A | Discharge status: Home / Self Care | Payer: Medicaid Other | Source: Ambulatory Visit | Attending: Student | Admitting: Student

## 2017-10-13 VITALS — BP 118/95 | HR 57 | Ht 61.0 in | Wt 102.6 lb

## 2017-10-13 DIAGNOSIS — Z01419 Encounter for gynecological examination (general) (routine) without abnormal findings: Secondary | ICD-10-CM

## 2017-10-13 DIAGNOSIS — Z0000 Encounter for general adult medical examination without abnormal findings: Secondary | ICD-10-CM

## 2017-10-13 DIAGNOSIS — Z7251 High risk heterosexual behavior: Secondary | ICD-10-CM

## 2017-10-13 NOTE — Patient Instructions (Signed)
Oral Contraception Information Oral contraceptive pills (OCPs) are medicines taken to prevent pregnancy. OCPs work by preventing the ovaries from releasing eggs. The hormones in OCPs also cause the cervical mucus to thicken, preventing the sperm from entering the uterus. The hormones also cause the uterine lining to become thin, not allowing a fertilized egg to attach to the inside of the uterus. OCPs are highly effective when taken exactly as prescribed. However, OCPs do not prevent sexually transmitted diseases (STDs). Safe sex practices, such as using condoms along with the pill, can help prevent STDs. Before taking the pill, you may have a physical exam and Pap test. Your health care provider may order blood tests. The health care provider will make sure you are a good candidate for oral contraception. Discuss with your health care provider the possible side effects of the OCP you may be prescribed. When starting an OCP, it can take 2 to 3 months for the body to adjust to the changes in hormone levels in your body. Types of oral contraception  The combination pill-This pill contains estrogen and progestin (synthetic progesterone) hormones. The combination pill comes in 21-day, 28-day, or 91-day packs. Some types of combination pills are meant to be taken continuously (365-day pills). With 21-day packs, you do not take pills for 7 days after the last pill. With 28-day packs, the pill is taken every day. The last 7 pills are without hormones. Certain types of pills have more than 21 hormone-containing pills. With 91-day packs, the first 84 pills contain both hormones, and the last 7 pills contain no hormones or contain estrogen only.  The minipill-This pill contains the progesterone hormone only. The pill is taken every day continuously. It is very important to take the pill at the same time each day. The minipill comes in packs of 28 pills. All 28 pills contain the hormone. Advantages of oral  contraceptive pills  Decreases premenstrual symptoms.  Treats menstrual period cramps.  Regulates the menstrual cycle.  Decreases a heavy menstrual flow.  May treatacne, depending on the type of pill.  Treats abnormal uterine bleeding.  Treats polycystic ovarian syndrome.  Treats endometriosis.  Can be used as emergency contraception. Things that can make oral contraceptive pills less effective OCPs can be less effective if:  You forget to take the pill at the same time every day.  You have a stomach or intestinal disease that lessens the absorption of the pill.  You take OCPs with other medicines that make OCPs less effective, such as antibiotics, certain HIV medicines, and some seizure medicines.  You take expired OCPs.  You forget to restart the pill on day 7, when using the packs of 21 pills.  Risks associated with oral contraceptive pills Oral contraceptive pills can sometimes cause side effects, such as:  Headache.  Nausea.  Breast tenderness.  Irregular bleeding or spotting.  Combination pills are also associated with a small increased risk of:  Blood clots.  Heart attack.  Stroke.  This information is not intended to replace advice given to you by your health care provider. Make sure you discuss any questions you have with your health care provider. Document Released: 11/20/2002 Document Revised: 02/05/2016 Document Reviewed: 02/18/2013 Elsevier Interactive Patient Education  2018 [employer]  

## 2017-10-14 LAB — CYTOLOGY - PAP
CHLAMYDIA, DNA PROBE: NEGATIVE
Diagnosis: NEGATIVE
NEISSERIA GONORRHEA: NEGATIVE
Trichomonas: NEGATIVE

## 2017-10-14 LAB — POCT PREGNANCY, URINE: Preg Test, Ur: NEGATIVE

## 2017-10-14 NOTE — Progress Notes (Signed)
Subjective:     Patient ID: Shelby Serrano, female   DOB: [birth year], 22 y.o.   MRN: [id number]  Patient Shelby Serrano is a 22 y.o. [phone] Non-pregnant female here for birth control. She has had heavy, irregular periods in the past and does not want to deal with them anymore. She also does not want to get pregnant. She has had unprotected sex in the past two weeks.   Gynecologic Exam  The patient's pertinent negatives include no genital itching, genital lesions, genital odor, genital rash, missed menses, pelvic pain, vaginal bleeding or vaginal discharge. She is sexually active. It is unknown whether or not her partner has an STD. She uses nothing for contraception.     Review of Systems  Constitutional: Negative.   HENT: Negative.   Eyes: Negative.   Respiratory: Negative.   Cardiovascular: Negative.   Gastrointestinal: Negative.   Endocrine: Negative.   Genitourinary: Negative.  Negative for missed menses, pelvic pain and vaginal discharge.  Hematological: Negative.   Psychiatric/Behavioral: Negative.        Objective:   Physical Exam  Constitutional: She is oriented to person, place, and time. She appears well-developed.  HENT:  Head: Normocephalic.  Eyes: Pupils are equal, round, and reactive to light.  Neck: Normal range of motion.  Cardiovascular: Normal rate.  Pulmonary/Chest: Effort normal.  Abdominal: Soft.  Genitourinary: Vagina normal. No vaginal discharge found.  Musculoskeletal: Normal range of motion.  Neurological: She is alert and oriented to person, place, and time.  Skin: Skin is warm and dry.       Assessment:     Healthy well-woman exam; UPT negative.     Plan:     -Patient is at risk for pregnancy; she will abstain from unintedned intercourse for two weeks and [facility]; planning birth control pills.  -Pap smear done today.  -All questions answered.  Luna KitchensKathryn Kooistra CNM

## 2017-10-27 ENCOUNTER — Ambulatory Visit: Payer: Self-pay | Admitting: Student

## 2018-01-19 ENCOUNTER — Ambulatory Visit (HOSPITAL_COMMUNITY)
Admission: EM | Admit: 2018-01-19 | Discharge: 2018-01-19 | Disposition: A | Discharge status: Home / Self Care | Payer: Medicaid Other | Attending: Family Medicine | Admitting: Family Medicine

## 2018-01-19 ENCOUNTER — Encounter (HOSPITAL_COMMUNITY): Payer: Self-pay | Admitting: Emergency Medicine

## 2018-01-19 DIAGNOSIS — R1110 Vomiting, unspecified: Secondary | ICD-10-CM

## 2018-01-19 DIAGNOSIS — Z3201 Encounter for pregnancy test, result positive: Secondary | ICD-10-CM

## 2018-01-19 DIAGNOSIS — R51 Headache: Secondary | ICD-10-CM

## 2018-01-19 DIAGNOSIS — M79604 Pain in right leg: Secondary | ICD-10-CM

## 2018-01-19 DIAGNOSIS — R0789 Other chest pain: Secondary | ICD-10-CM

## 2018-01-19 LAB — POCT URINALYSIS DIP (DEVICE)
Bilirubin Urine: NEGATIVE
GLUCOSE, UA: NEGATIVE mg/dL
HGB URINE DIPSTICK: NEGATIVE
Ketones, ur: 15 mg/dL — AB
Nitrite: NEGATIVE
PROTEIN: NEGATIVE mg/dL
SPECIFIC GRAVITY, URINE: 1.015 (ref 1.005–1.030)
UROBILINOGEN UA: 1 mg/dL (ref 0.0–1.0)
pH: 6.5 (ref 5.0–8.0)

## 2018-01-19 LAB — POCT PREGNANCY, URINE: Preg Test, Ur: POSITIVE — AB

## 2018-01-19 MED ORDER — DOXYLAMINE-PYRIDOXINE 10-10 MG PO TBEC: 10.0000 mg | DELAYED_RELEASE_TABLET | Freq: Every day | ORAL | 0 refills | Status: DC

## 2018-01-19 NOTE — ED Triage Notes (Signed)
Pt has multiple complaints, vomiting for several weeks, chest pain, states shes had a headache, states "my right leg gave out earlier today".

## 2018-01-19 NOTE — ED Notes (Signed)
Urine specimen obtained and in lab 

## 2018-01-19 NOTE — Discharge Instructions (Signed)
Pregnancy test was positive, please begin prenatal vitamin and follow-up with women's clinic.  Please use Tylenol for your pain.  Do not use ibuprofen.  Diclegis: Two tablets at bedtime on day 1 and 2; if symptoms persist, take 1 tablet in morning and 2 tablets at bedtime on day 3; if symptoms persist, may increase to 1 tablet in morning, 1 tablet mid-afternoon, and 2 tablets at bedtime on day 4 (maximum: doxylamine 40 mg/pyridoxine 40 mg (4 tablets) per day). OR One-half of the 25 mg Unisom sleep tablet over-the-counter tablet or two chewable 5 mg tablets can be used off-label as an antiemetic. In addition, pyridoxine 25 mg, also available over-the-counter, is taken three or four times per day;This is a reasonable, less expensive substitute for combination tablets.

## 2018-01-20 NOTE — ED Provider Notes (Signed)
[facility]    CSN: [id number] Arrival date & time: 01/19/18  1813     History   Chief Complaint Chief Complaint  Patient presents with  . Emesis  . Headache  . Chest Pain  . Leg Pain    HPI Shelby Serrano is a 22 y.o. female no significant past medical history presenting today for evaluation of multiple complaints.  Patient is endorsing vomiting, chest pain, headaches and right leg pain.  Patient states that she has had vomiting off and on for the past 2 weeks, but this has become more persistent this week.  Notes that she vomited twice on Monday, once on Wednesday and also today.  She has had difficulty keeping food down.  She denies any associated abdominal pain.  Denies changes in bowel movements to include diarrhea and constipation.  Patient states that she is on Depo and her menstrual cycles have been irregular since she has been using this.  Patient also endorsing headaches that we will switch sides of her head, will be brief but are associated with a sensation of lightheadedness/presyncope.  Also endorsing some chest discomfort in the middle of her chest, worsens with breathing and pressure.  Patient is having this chest discomfort during visit.  Denies any cough, shortness of breath.  Denies fevers.  Patient also states that earlier today her right leg gave out, is that she has having pain in her right leg for a couple weeks as well.  Endorsing some calf tenderness.  Denies any leg swelling.  Denies any overlying skin changes.  Denies previous DVT/PE.  Patient is only recent immobilization/travel was going to [place] last weekend which was an hour and a half drive.  Patient quit smoking cigarettes approximately 2 months ago.  HPI  History reviewed. No pertinent past medical history.  Patient Active Problem List   Diagnosis Date Noted  . Unprotected sexual intercourse 10/13/2017    History reviewed. No pertinent surgical history.  OB History    Gravida   4   Para  2   Term  2   Preterm      AB  2   Living  2     SAB  2   TAB      Ectopic      Multiple  0   Live Births  2            Home Medications    Prior to Admission medications   Medication Sig Start Date End Date Taking? Authorizing Provider  Doxylamine-Pyridoxine 10-10 MG TBEC Take 10 mg by mouth daily. 01/19/18   Rhiley Solem, Junius Creamer, PA-C    Family History Family History  Problem Relation Age of Onset  . Diabetes Mother   . Hypertension Mother     Social History Social History   Tobacco Use  . Smoking status: Current Every Day Smoker    Packs/day: 1.00    Types: Cigarettes    Last attempt to quit: 02/08/2016    Years since quitting: 1.9  . Smokeless tobacco: Never Used  Substance Use Topics  . Alcohol use: No  . Drug use: Yes    Types: Marijuana    Comment: Patient denies, Positive UDS 05/04/16 [facility]     Allergies   Patient has no known allergies.   Review of Systems Review of Systems  Constitutional: Negative for chills, fatigue and fever.  HENT: Negative for congestion, ear pain, rhinorrhea, sinus pressure, sore throat and trouble swallowing.  Eyes: Negative for visual disturbance.  Respiratory: Positive for cough. Negative for chest tightness and shortness of breath.   Cardiovascular: Positive for chest pain. Negative for leg swelling.  Gastrointestinal: Positive for nausea and vomiting. Negative for abdominal pain and diarrhea.  Genitourinary: Negative for dysuria, flank pain, genital sores, hematuria, menstrual problem, vaginal bleeding, vaginal discharge and vaginal pain.  Musculoskeletal: Positive for myalgias. Negative for back pain, gait problem, neck pain and neck stiffness.  Skin: Negative for rash.  Neurological: Positive for light-headedness and headaches. Negative for dizziness.     Physical Exam Triage Vital Signs ED Triage Vitals [01/19/18 1905]  Enc Vitals Group     BP 119/83     Pulse Rate 61     Resp 18      Temp 98 F (36.7 C)     Temp src      SpO2 100 %     Weight      Height      Head Circumference      Peak Flow      Pain Score      Pain Loc      Pain Edu?      Excl. in GC?    Orthostatic VS for the past 24 hrs:  BP- Lying Pulse- Lying BP- Sitting Pulse- Sitting  01/19/18 1955 126/82 58 123/75 58    Updated Vital Signs BP 119/83   Pulse 61   Temp 98 F (36.7 C)   Resp 18   LMP  (Within Weeks)   SpO2 100%   Visual Acuity Right Eye Distance:   Left Eye Distance:   Bilateral Distance:    Right Eye Near:   Left Eye Near:    Bilateral Near:     Physical Exam  Constitutional: She appears well-developed and well-nourished. No distress.  HENT:  Head: Normocephalic and atraumatic.  Mouth/Throat: Oropharynx is clear and moist.  Eyes: Conjunctivae are normal.  Neck: Neck supple.  Cardiovascular: Normal rate and regular rhythm.  No murmur heard. Pulmonary/Chest: Effort normal and breath sounds normal. No respiratory distress.  Breathing comfortably at rest, CTA BL, chest discomfort elicited with deep inspiration, mild reproduction with pressure/palpation near central chest/sternal areas  Abdominal: Soft. There is no tenderness.  Nontender to light and deep palpation throughout abdomen  Musculoskeletal: She exhibits no edema.  No unilateral leg swelling, minimal tenderness to palpation of right calf, patient does have discomfort with dorsiflexion of right foot.  Neurological: She is alert.  Patient is alert and oriented, cranial nerves II to XII grossly intact, strength 5/5 and equal bilaterally at shoulders and hips.  Reflexes 2+ patellar tendon bilaterally.  Gait without abnormality.  Skin: Skin is warm and dry.  Psychiatric: She has a normal mood and affect.  Nursing note and vitals reviewed.    UC Treatments / Results  Labs (all labs ordered are listed, but only abnormal results are displayed) Labs Reviewed  POCT URINALYSIS DIP (DEVICE) - Abnormal; Notable for  the following components:      Result Value   Ketones, ur 15 (*)    Leukocytes, UA TRACE (*)    All other components within normal limits  POCT PREGNANCY, URINE - Abnormal; Notable for the following components:   Preg Test, Ur POSITIVE (*)    All other components within normal limits    EKG None  Radiology No results found.  Procedures Procedures (including critical care time)  Medications Ordered in UC Medications - No data to display  Initial Impression / Assessment and Plan / UC Course  I have reviewed the triage vital signs and the nursing notes.  Pertinent labs & imaging results that were available during my care of the patient were reviewed by me and considered in my medical decision making (see chart for details).     Patient with positive pregnancy test.  EKG normal sinus without abnormality.  Orthostatics negative.  Pregnancy can explain her nausea and vomiting and possibly her chest discomfort as well.  Will have patient contact women's clinic and follow-up with them, initiate prenatal vitamin.  Likely just for nausea or vomiting.  Discussed with patient given constellation of symptoms that she is moderate risk especially with positive pregnancy test for having a DVT/PE.  If she has worsening calf pain, develop swelling or overlying erythema, worsening chest discomfort to go to emergency room. Discussed strict return precautions. Patient verbalized understanding and is agreeable with plan.  Final Clinical Impressions(s) / UC Diagnoses   Final diagnoses:  Positive pregnancy test     Discharge Instructions     Pregnancy test was positive, please begin prenatal vitamin and follow-up with women's clinic.  Please use Tylenol for your pain.  Do not use ibuprofen.  Diclegis: Two tablets at bedtime on day 1 and 2; if symptoms persist, take 1 tablet in morning and 2 tablets at bedtime on day 3; if symptoms persist, may increase to 1 tablet in morning, 1 tablet  mid-afternoon, and 2 tablets at bedtime on day 4 (maximum: doxylamine 40 mg/pyridoxine 40 mg (4 tablets) per day). OR One-half of the 25 mg Unisom sleep tablet over-the-counter tablet or two chewable 5 mg tablets can be used off-label as an antiemetic. In addition, pyridoxine 25 mg, also available over-the-counter, is taken three or four times per day;This is a reasonable, less expensive substitute for combination tablets.    ED Prescriptions    Medication Sig Dispense Auth. Provider   Doxylamine-Pyridoxine 10-10 MG TBEC Take 10 mg by mouth daily. 60 tablet Ahni Bradwell, [place] C, PA-C     Controlled Substance Prescriptions [place] Controlled Substance Registry consulted? Not Applicable   Lew Dawes, [place] 01/20/18 [phone]

## 2018-02-15 ENCOUNTER — Encounter: Payer: Self-pay | Admitting: *Deleted

## 2018-02-15 ENCOUNTER — Encounter: Payer: Self-pay | Admitting: Student

## 2018-02-15 ENCOUNTER — Ambulatory Visit (INDEPENDENT_AMBULATORY_CARE_PROVIDER_SITE_OTHER): Payer: Self-pay | Admitting: Student

## 2018-02-15 ENCOUNTER — Encounter: Payer: Self-pay | Admitting: General Practice

## 2018-02-15 ENCOUNTER — Other Ambulatory Visit (HOSPITAL_COMMUNITY)
Admission: RE | Admit: 2018-02-15 | Discharge: 2018-02-15 | Disposition: A | Discharge status: Home / Self Care | Payer: Medicaid Other | Source: Ambulatory Visit | Attending: Student | Admitting: Student

## 2018-02-15 ENCOUNTER — Ambulatory Visit: Payer: Self-pay

## 2018-02-15 VITALS — BP 102/65 | HR 60 | Wt 99.9 lb

## 2018-02-15 DIAGNOSIS — Z3687 Encounter for antenatal screening for uncertain dates: Secondary | ICD-10-CM | POA: Diagnosis not present

## 2018-02-15 DIAGNOSIS — O9902 Anemia complicating childbirth: Secondary | ICD-10-CM

## 2018-02-15 DIAGNOSIS — Z3483 Encounter for supervision of other normal pregnancy, third trimester: Secondary | ICD-10-CM | POA: Insufficient documentation

## 2018-02-15 DIAGNOSIS — Z3480 Encounter for supervision of other normal pregnancy, unspecified trimester: Secondary | ICD-10-CM

## 2018-02-15 DIAGNOSIS — Z3481 Encounter for supervision of other normal pregnancy, first trimester: Secondary | ICD-10-CM | POA: Diagnosis not present

## 2018-02-15 DIAGNOSIS — O0991 Supervision of high risk pregnancy, unspecified, first trimester: Secondary | ICD-10-CM

## 2018-02-15 DIAGNOSIS — O3680X0 Pregnancy with inconclusive fetal viability, not applicable or unspecified: Secondary | ICD-10-CM

## 2018-02-15 DIAGNOSIS — D573 Sickle-cell trait: Secondary | ICD-10-CM | POA: Insufficient documentation

## 2018-02-15 LAB — POCT URINALYSIS DIP (DEVICE)
Bilirubin Urine: NEGATIVE
Glucose, UA: NEGATIVE mg/dL
Hgb urine dipstick: NEGATIVE
KETONES UR: NEGATIVE mg/dL
Leukocytes, UA: NEGATIVE
Nitrite: NEGATIVE
PH: 7.5 (ref 5.0–8.0)
PROTEIN: NEGATIVE mg/dL
SPECIFIC GRAVITY, URINE: 1.015 (ref 1.005–1.030)
Urobilinogen, UA: 0.2 mg/dL (ref 0.0–1.0)

## 2018-02-15 NOTE — Patient Instructions (Signed)

## 2018-02-15 NOTE — Progress Notes (Signed)
Subjective:   Shelby Serrano is a 22 y.o. [id number] at 9853w4d by early ultrasound being seen today for her first obstetrical visit.  Her obstetrical history is significant for sickle cell trait. Patient does not intend to breast feed. Pregnancy history fully reviewed.  Patient reports fatigue, headache, no bleeding, no contractions and no cramping. States she experiences occasional headaches which worsen when playing with her children, has not tried medication for relief  Previously experienced nausea and vomiting, now resolved without treatment.  HISTORY: OB History  Gravida Para Term Preterm AB Living  6 2 2  0 3 2  SAB TAB Ectopic Multiple Live Births  3 0 0 0 2    # Outcome Date GA Lbr Len/2nd Weight Sex Delivery Anes PTL Lv  6 Current           5 Term 07/15/16 7256w5d 09:37 / 00:07 6 lb 0.1 oz (2.725 kg) F Vag-Spont EPI  LIV     Name: Buehner,GIRL Anushri     Apgar1: 8  Apgar5: 9  4 SAB 07/2015 6443w0d         3 SAB 04/2015          2 Term 04/05/12 6160w0d  6 lb 2 oz (2.778 kg)  Vag-Spont   LIV     Birth Comments: no complications  1 SAB             Last pap smear was done 09/2017 and was normal  Past Medical History:  Diagnosis Date  . Sickle cell trait (HCC)    History reviewed. No pertinent surgical history. Family History  Problem Relation Age of Onset  . Diabetes Mother   . Hypertension Mother    Social History   Tobacco Use  . Smoking status: Current Every Day Smoker    Packs/day: 1.00    Types: Cigarettes    Last attempt to quit: 02/08/2016    Years since quitting: 2.0  . Smokeless tobacco: Never Used  Substance Use Topics  . Alcohol use: No  . Drug use: Yes    Types: Marijuana    Comment: Patient denies, [facility]   No Known Allergies Current Outpatient Medications on File Prior to Visit  Medication Sig Dispense Refill  . Doxylamine-Pyridoxine 10-10 MG TBEC Take 10 mg by mouth daily. (Patient not taking: Reported on 02/15/2018) 60  tablet 0   No current facility-administered medications on file prior to visit.     Review of Systems Pertinent items noted in HPI and remainder of comprehensive ROS otherwise negative.  Exam   Vitals:   02/15/18 1332  Weight: 99 lb 14.4 oz (45.3 kg)   Fetal Heart Rate (bpm): 156  Uterus:     Pelvic Exam: Perineum: no hemorrhoids, normal perineum   Vulva: normal external genitalia, no lesions   Vagina:  normal mucosa, normal discharge   Cervix: no lesions and normal, pap smear done.    Adnexa: normal adnexa and no mass, fullness, tenderness   Bony Pelvis: average  System: General: well-developed, well-nourished female in no acute distress   Breast:  normal appearance, no masses or tenderness   Skin: normal coloration and turgor, no rashes   Neurologic: oriented, normal, negative, normal mood   Extremities: normal strength, tone, and muscle mass, ROM of all joints is normal   HEENT PERRLA, extraocular movement intact and sclera clear, anicteric   Mouth/Teeth mucous membranes moist, pharynx normal without lesions and dental hygiene good   Neck  supple and no masses   Cardiovascular: regular rate and rhythm   Respiratory:  no respiratory distress, normal breath sounds   Abdomen: soft, non-tender; bowel sounds normal; no masses,  no organomegaly     Assessment:   Pregnancy: [id number] Patient Active Problem List   Diagnosis Date Noted  . Supervision of high risk pregnancy, antepartum, first trimester 02/15/2018  . Sickle cell trait (HCC) 02/15/2018     Plan:  1. Supervision of other normal pregnancy, antepartum - Feeling well, unplanned pregnancy  - [id number] with IUP at [redacted]w[redacted]d by US performed today - US OB Limited; Future - Cervicovaginal ancillary only  2. Encounter to determine fetal viability of pregnancy, single or unspecified fetus - US OB Limited; Future  3. Supervision of high risk pregnancy, antepartum, first trimester  - [facility] AMB BABYSCRIPTS OPT IN - Culture,  OB Urine - Cystic fibrosis gene test - Hemoglobinopathy Evaluation - Obstetric Panel, Including HIV - SMN1 COPY NUMBER ANALYSIS (SMA Carrier Screen) - Genetic Screening  4. Sickle cell trait (HCC) - unsure of partner testing, given information for free testing in [facility]   Initial labs drawn. Continue prenatal vitamins. Genetic Screening discussed, Quad screen: requested. Ultrasound discussed; fetal anatomic survey: ordered. Problem list reviewed and updated. The nature of  - [facility] with multiple MDs and other Advanced Practice Providers was explained to patient; also emphasized that residents, students are part of our team. Routine obstetric precautions reviewed. Return in about 1 month (around 03/15/2018).       Calvert Cantor, CNM  02/15/18 2:33 PM

## 2018-02-15 NOTE — Progress Notes (Signed)
Pt informed that the ultrasound is considered a limited OB ultrasound and is not intended to be a complete ultrasound exam.  Patient also informed that the ultrasound is not being completed with the intent of assessing for fetal or placental anomalies or any pelvic abnormalities.  Explained that the purpose of today's ultrasound is to assess for viability and (dating) EGA.  Patient acknowledges the purpose of the exam and the limitations of the study.

## 2018-02-16 LAB — CERVICOVAGINAL ANCILLARY ONLY
Bacterial vaginitis: POSITIVE — AB
Candida vaginitis: NEGATIVE
Chlamydia: NEGATIVE
Neisseria Gonorrhea: NEGATIVE
Trichomonas: NEGATIVE

## 2018-02-17 ENCOUNTER — Telehealth: Payer: Self-pay | Admitting: *Deleted

## 2018-02-17 LAB — CULTURE, OB URINE

## 2018-02-17 LAB — URINE CULTURE, OB REFLEX

## 2018-02-17 MED ORDER — PRENATAL VITAMINS 0.8 MG PO TABS: 1.0000 | ORAL_TABLET | Freq: Every day | ORAL | 12 refills | Status: DC

## 2018-02-17 MED ORDER — METRONIDAZOLE 500 MG PO TABS: 500.0000 mg | ORAL_TABLET | Freq: Two times a day (BID) | ORAL | 0 refills | Status: DC

## 2018-02-17 NOTE — Telephone Encounter (Signed)
Called pt and informed her of test result showing +Bacterial Vaginosis and treatment prescribed. Pt also asked for Rx to be sent in for prenatal vitamins. Pt voiced understanding of information given.

## 2018-02-17 NOTE — Telephone Encounter (Signed)
-----   Message from Calvert CantorSamantha C Weinhold, [place] sent at 02/16/2018  5:37 PM EDT ----- Positive for Bacterial Vaginosis. Please send Flagyl 500 BID x 7 days and notify patient. Thanks for your help!

## 2018-02-22 LAB — OBSTETRIC PANEL, INCLUDING HIV
Antibody Screen: NEGATIVE
BASOS ABS: 0 10*3/uL (ref 0.0–0.2)
Basos: 0 %
EOS (ABSOLUTE): 0.1 10*3/uL (ref 0.0–0.4)
Eos: 1 %
HIV Screen 4th Generation wRfx: NONREACTIVE
Hematocrit: 39.2 % (ref 34.0–46.6)
Hemoglobin: 13.3 g/dL (ref 11.1–15.9)
Hepatitis B Surface Ag: NEGATIVE
IMMATURE GRANULOCYTES: 0 %
Immature Grans (Abs): 0 10*3/uL (ref 0.0–0.1)
LYMPHS: 16 %
Lymphocytes Absolute: 1.7 10*3/uL (ref 0.7–3.1)
MCH: 29.9 pg (ref 26.6–33.0)
MCHC: 33.9 g/dL (ref 31.5–35.7)
MCV: 88 fL (ref 79–97)
MONOCYTES: 6 %
Monocytes Absolute: 0.6 10*3/uL (ref 0.1–0.9)
NEUTROS PCT: 77 %
Neutrophils Absolute: 8.1 10*3/uL — ABNORMAL HIGH (ref 1.4–7.0)
PLATELETS: 186 10*3/uL (ref 150–450)
RBC: 4.45 x10E6/uL (ref 3.77–5.28)
RDW: 14.2 % (ref 12.3–15.4)
RPR: NONREACTIVE
RUBELLA: 2.61 {index} (ref >0.99)
Rh Factor: POSITIVE
WBC: 10.7 10*3/uL (ref 3.4–10.8)

## 2018-02-22 LAB — SMN1 COPY NUMBER ANALYSIS (SMA CARRIER SCREENING)

## 2018-02-22 LAB — HEMOGLOBINOPATHY EVALUATION
FERRITIN: 25 ng/mL (ref 15–150)
HGB A2 QUANT: 3.8 % — AB (ref 1.8–3.2)
HGB F QUANT: 0 % (ref 0.0–2.0)
HGB VARIANT: 0 %
Hgb A: 56.5 % — ABNORMAL LOW (ref 96.4–98.8)
Hgb C: 0 %
Hgb S: 39.7 % — ABNORMAL HIGH
Hgb Solubility: POSITIVE — AB

## 2018-02-22 LAB — CYSTIC FIBROSIS GENE TEST

## 2018-02-23 ENCOUNTER — Encounter: Payer: Self-pay | Admitting: *Deleted

## 2018-03-15 ENCOUNTER — Encounter: Payer: Self-pay | Admitting: Advanced Practice Midwife

## 2018-03-15 ENCOUNTER — Ambulatory Visit (INDEPENDENT_AMBULATORY_CARE_PROVIDER_SITE_OTHER): Payer: Self-pay | Admitting: Advanced Practice Midwife

## 2018-03-15 VITALS — BP 107/59 | HR 62 | Wt 105.3 lb

## 2018-03-15 DIAGNOSIS — D573 Sickle-cell trait: Secondary | ICD-10-CM

## 2018-03-15 DIAGNOSIS — O0991 Supervision of high risk pregnancy, unspecified, first trimester: Secondary | ICD-10-CM

## 2018-03-15 NOTE — Progress Notes (Signed)
   PRENATAL VISIT NOTE  Subjective:  Shelby Serrano Shelby Serrano is a 22 y.o. [phone] at [redacted]w[redacted]d being seen today for ongoing prenatal care.  She is currently monitored for the following issues for this high-risk pregnancy and has Supervision of high risk pregnancy, antepartum, first trimester and Sickle cell trait (HCC) on their problem list.  Patient reports no complaints.  Contractions: Not present. Vag. Bleeding: None.  Movement: Absent. Denies leaking of fluid.   The following portions of the patient's history were reviewed and updated as appropriate: allergies, current medications, past family history, past medical history, past social history, past surgical history and problem list. Problem list updated.  Objective:   Vitals:   03/15/18 0913  BP: (!) 107/59  Pulse: 62  Weight: 105 lb 4.8 oz (47.8 kg)    Fetal Status: Fetal Heart Rate (bpm): 143   Movement: Absent     General:  Alert, oriented and cooperative. Patient is in no acute distress.  Skin: Skin is warm and dry. No rash noted.   Cardiovascular: Normal heart rate noted  Respiratory: Normal respiratory effort, no problems with respiration noted  Abdomen: Soft, gravid, appropriate for gestational age.  Pain/Pressure: Absent     Pelvic: Cervical exam deferred        Extremities: Normal range of motion.  Edema: None  Mental Status: Normal mood and affect. Normal behavior. Normal judgment and thought content.   Assessment and Plan:  Pregnancy: [id number] at 1822w0d  1. Supervision of high risk pregnancy, antepartum, first trimester  - no complaints today - US MFM OB COMP + 14 WK; Future - AFP, Serum, Open Spina Bifida  2. Sickle cell trait (HCC) -urine culture collected today   Preterm labor symptoms and general obstetric precautions including but not limited to vaginal bleeding, contractions, leaking of fluid and fetal movement were reviewed in detail with the patient. Please refer to After Visit Summary for other counseling  recommendations.  Return in about 1 month (around 04/12/2018).  Future Appointments  Date Time Provider Department Center  03/30/2018  2:15 PM [facility] US 4 [facility]    Calvert CantorSamantha C Geraldina Parrott, CNM  03/15/18  9:28 AM

## 2018-03-19 LAB — AFP, SERUM, OPEN SPINA BIFIDA
AFP MOM: 0.67
AFP VALUE AFPOSL: 34.8 ng/mL
Gest. Age on Collection Date: 17 weeks
MATERNAL AGE AT EDD: 22.4 a
OSBR RISK 1 IN: 10000
TEST RESULTS AFP: NEGATIVE
Weight: 105 [lb_av]

## 2018-03-23 ENCOUNTER — Encounter (HOSPITAL_COMMUNITY): Payer: Self-pay

## 2018-03-30 ENCOUNTER — Ambulatory Visit (HOSPITAL_COMMUNITY)
Admission: RE | Admit: 2018-03-30 | Discharge: 2018-03-30 | Disposition: A | Discharge status: Home / Self Care | Payer: Medicaid Other | Source: Ambulatory Visit | Attending: Advanced Practice Midwife | Admitting: Advanced Practice Midwife

## 2018-03-30 ENCOUNTER — Other Ambulatory Visit: Payer: Self-pay | Admitting: Advanced Practice Midwife

## 2018-03-30 ENCOUNTER — Other Ambulatory Visit (HOSPITAL_COMMUNITY): Payer: Self-pay | Admitting: *Deleted

## 2018-03-30 DIAGNOSIS — Z362 Encounter for other antenatal screening follow-up: Secondary | ICD-10-CM

## 2018-03-30 DIAGNOSIS — Z36 Encounter for antenatal screening for chromosomal anomalies: Secondary | ICD-10-CM | POA: Diagnosis not present

## 2018-03-30 DIAGNOSIS — D573 Sickle-cell trait: Secondary | ICD-10-CM | POA: Diagnosis not present

## 2018-03-30 DIAGNOSIS — Z3A19 19 weeks gestation of pregnancy: Secondary | ICD-10-CM

## 2018-03-30 DIAGNOSIS — O0991 Supervision of high risk pregnancy, unspecified, first trimester: Secondary | ICD-10-CM

## 2018-03-30 DIAGNOSIS — Z3689 Encounter for other specified antenatal screening: Secondary | ICD-10-CM | POA: Diagnosis not present

## 2018-04-13 ENCOUNTER — Encounter: Payer: Self-pay | Admitting: Advanced Practice Midwife

## 2018-04-27 ENCOUNTER — Ambulatory Visit (HOSPITAL_COMMUNITY): Payer: Medicaid Other

## 2018-04-27 ENCOUNTER — Encounter: Payer: Self-pay | Admitting: Obstetrics and Gynecology

## 2018-05-03 ENCOUNTER — Encounter (HOSPITAL_COMMUNITY): Payer: Self-pay

## 2018-05-03 ENCOUNTER — Ambulatory Visit (INDEPENDENT_AMBULATORY_CARE_PROVIDER_SITE_OTHER): Payer: Medicaid Other | Admitting: Internal Medicine

## 2018-05-03 ENCOUNTER — Ambulatory Visit (HOSPITAL_COMMUNITY)
Admission: RE | Admit: 2018-05-03 | Discharge: 2018-05-03 | Disposition: A | Discharge status: Home / Self Care | Payer: Medicaid Other | Source: Ambulatory Visit | Attending: Obstetrics and Gynecology | Admitting: Obstetrics and Gynecology

## 2018-05-03 VITALS — BP 96/57 | HR 74 | Wt 111.9 lb

## 2018-05-03 DIAGNOSIS — Z3A24 24 weeks gestation of pregnancy: Secondary | ICD-10-CM | POA: Diagnosis not present

## 2018-05-03 DIAGNOSIS — Z862 Personal history of diseases of the blood and blood-forming organs and certain disorders involving the immune mechanism: Secondary | ICD-10-CM

## 2018-05-03 DIAGNOSIS — D573 Sickle-cell trait: Secondary | ICD-10-CM

## 2018-05-03 DIAGNOSIS — Z362 Encounter for other antenatal screening follow-up: Secondary | ICD-10-CM | POA: Diagnosis not present

## 2018-05-03 DIAGNOSIS — O0992 Supervision of high risk pregnancy, unspecified, second trimester: Secondary | ICD-10-CM

## 2018-05-03 DIAGNOSIS — N939 Abnormal uterine and vaginal bleeding, unspecified: Secondary | ICD-10-CM

## 2018-05-03 LAB — POCT URINALYSIS DIP (DEVICE)
BILIRUBIN URINE: NEGATIVE
GLUCOSE, UA: NEGATIVE mg/dL
Ketones, ur: NEGATIVE mg/dL
Nitrite: NEGATIVE
PH: 6 (ref 5.0–8.0)
PROTEIN: NEGATIVE mg/dL
SPECIFIC GRAVITY, URINE: 1.01 (ref 1.005–1.030)
Urobilinogen, UA: 0.2 mg/dL (ref 0.0–1.0)

## 2018-05-03 NOTE — Progress Notes (Signed)
   PRENATAL VISIT NOTE  Subjective:  Shelby Serrano is a 22 y.o. [phone] at 5959w0d being seen today for ongoing prenatal care.  She is currently monitored for the following issues for this high-risk pregnancy and has Supervision of high risk pregnancy, antepartum, first trimester and Sickle cell trait (HCC) on their problem list.  Patient reports vaginal spotting. This started this morning after having intercourse. Reports she is having minimal bleeding at present. It has continued to slow throughout the day. Was never more than the a few drops on underwear or toilet paper. No associated pelvic or abdominal pain.   Contractions: Not present.    Movement: Present. Denies leaking of fluid.   The following portions of the patient's history were reviewed and updated as appropriate: allergies, current medications, past family history, past medical history, past social history, past surgical history and problem list. Problem list updated.  Objective:   Vitals:   05/03/18 1815  BP: (!) 96/57  Pulse: 74  Weight: 111 lb 14.4 oz (50.8 kg)    Fetal Status: Fetal Heart Rate (bpm): 150 Fundal Height: 23 cm Movement: Present     General:  Alert, oriented and cooperative. Patient is in no acute distress.  Skin: Skin is warm and dry. No rash noted.   Cardiovascular: Normal heart rate noted  Respiratory: Normal respiratory effort, no problems with respiration noted  Abdomen: Soft, gravid, appropriate for gestational age.  Pain/Pressure: Present     Pelvic: Cervical exam deferred        Extremities: Normal range of motion.  Edema: None  Mental Status: Normal mood and affect. Normal behavior. Normal judgment and thought content.   Assessment and Plan:  Pregnancy: [id number] at 559w0d  1. Supervision of high risk pregnancy, antepartum, second trimester [facility] and 28 week labs.  Reviewed anatomy follow up sono results from earlier today.   2. Vaginal  spotting Discussed with patient that spotting post coitus is very common in pregnancy. Patient is not Rh negative. Patient had follow up ultrasound today for previous incomplete anatomy scan; there was no evidence of placenta previa. Due to lack of pain or heavy bleeding, unlikely to be SAB or placental abruption. Strict bleeding precautions discussed.   3. Sickle cell trait (HCC) - Culture, OB Urine for 2nd trimester   Preterm labor symptoms and general obstetric precautions including but not limited to vaginal bleeding, contractions, leaking of fluid and fetal movement were reviewed in detail with the patient. Please refer to After Visit Summary for other counseling recommendations.  Return in about 4 weeks (around 05/31/2018) for routine PNC,  2hr GTT and 28 wk labs .  No future appointments.  De Hollingsheadatherine L Wallace, DO

## 2018-05-03 NOTE — ED Notes (Signed)
Pt reports spotting that started after intercourse around 1am, left upper quad pain started around the same time.  No fetal movement since 6:30am.

## 2018-05-06 LAB — URINE CULTURE, OB REFLEX

## 2018-05-06 LAB — CULTURE, OB URINE

## 2018-05-30 ENCOUNTER — Other Ambulatory Visit: Payer: Self-pay | Admitting: General Practice

## 2018-05-30 DIAGNOSIS — O0991 Supervision of high risk pregnancy, unspecified, first trimester: Secondary | ICD-10-CM

## 2018-05-31 ENCOUNTER — Other Ambulatory Visit: Payer: Medicaid Other

## 2018-05-31 ENCOUNTER — Ambulatory Visit (INDEPENDENT_AMBULATORY_CARE_PROVIDER_SITE_OTHER): Payer: Medicaid Other | Admitting: Student

## 2018-05-31 VITALS — BP 108/71 | HR 73 | Wt 115.0 lb

## 2018-05-31 DIAGNOSIS — Z3483 Encounter for supervision of other normal pregnancy, third trimester: Secondary | ICD-10-CM

## 2018-05-31 DIAGNOSIS — O99013 Anemia complicating pregnancy, third trimester: Secondary | ICD-10-CM | POA: Diagnosis not present

## 2018-05-31 DIAGNOSIS — Z23 Encounter for immunization: Secondary | ICD-10-CM

## 2018-05-31 DIAGNOSIS — D573 Sickle-cell trait: Secondary | ICD-10-CM

## 2018-05-31 DIAGNOSIS — O0991 Supervision of high risk pregnancy, unspecified, first trimester: Secondary | ICD-10-CM

## 2018-05-31 NOTE — Progress Notes (Signed)
   PRENATAL VISIT NOTE  Subjective:  Shelby Serrano is a 22 y.o. [id number] at 7053w22d being seen today for ongoing prenatal care.  She is currently monitored for the following issues for this low-risk pregnancy and has Supervision of high risk pregnancy, antepartum, first trimester and Sickle cell trait (HCC) on their problem list.  Patient reports no complaints.  Contractions: Not present. Vag. Bleeding: None.  Movement: Present. Denies leaking of fluid.   The following portions of the patient's history were reviewed and updated as appropriate: allergies, current medications, past family history, past medical history, past social history, past surgical history and problem list. Problem list updated.  Objective:   Vitals:   05/31/18 0908  BP: 108/71  Pulse: 73  Weight: 115 lb (52.2 kg)    Fetal Status: Fetal Heart Rate (bpm): 144 Fundal Height: 27 cm Movement: Present     General:  Alert, oriented and cooperative. Patient is in no acute distress.  Skin: Skin is warm and dry. No rash noted.   Cardiovascular: Normal heart rate noted  Respiratory: Normal respiratory effort, no problems with respiration noted  Abdomen: Soft, gravid, appropriate for gestational age.  Pain/Pressure: Present     Pelvic: Cervical exam deferred        Extremities: Normal range of motion.  Edema: None  Mental Status: Normal mood and affect. Normal behavior. Normal judgment and thought content.   Assessment and Plan:  Pregnancy: [id number] at 3153w22d 1. Supervision of normal intrauterine pregnancy in multigravida in third trimester -undecided contraception, considering pills - Tdap vaccine greater than or equal to 7yo IM - Flu Vaccine QUAD 36+ mos IM  2. Need for Tdap vaccination  - Tdap vaccine greater than or equal to 7yo IM  3. Sickle cell trait (HCC) -FOB being tested in office today   Preterm labor symptoms and general obstetric precautions including but not limited to vaginal bleeding,  contractions, leaking of fluid and fetal movement were reviewed in detail with the patient. Please refer to After Visit Summary for other counseling recommendations.  Return in about 2 weeks (around 06/14/2018) for Routine OB.  No future appointments.  Judeth HornErin [place] Land[place]be, NP

## 2018-05-31 NOTE — Patient Instructions (Signed)
Research childbirth classes and hospital preregistration at [email or web address]  Fetal Movement Counts Patient Name: ________________________________________________ Patient Due Date: ____________________ What is a fetal movement count? A fetal movement count is the number of times that you feel your baby move during a certain amount of time. This may also be called a fetal kick count. A fetal movement count is recommended for every pregnant woman. You may be asked to start counting fetal movements as early as week 28 of your pregnancy. Pay attention to when your baby is most active. You may notice your baby's sleep and wake cycles. You may also notice things that make your baby move more. You should do a fetal movement count:  When your baby is normally most active.  At the same time each day.  A good time to count movements is while you are resting, after having something to eat and drink. How do I count fetal movements? 1. Find a quiet, comfortable area. Sit, or lie down on your side. 2. Write down the date, the start time and stop time, and the number of movements that you felt between those two times. Take this information with you to your health care visits. 3. For 2 hours, count kicks, flutters, swishes, rolls, and jabs. You should feel at least 10 movements during 2 hours. 4. You may stop counting after you have felt 10 movements. 5. If you do not feel 10 movements in 2 hours, have something to eat and drink. Then, keep resting and counting for 1 hour. If you feel at least 4 movements during that hour, you may stop counting. Contact a health care provider if:  You feel fewer than 4 movements in 2 hours.  Your baby is not moving like he or she usually does. Date: ____________ Start time: ____________ Stop time: ____________ Movements: ____________ Date: ____________ Start time: ____________ Stop time: ____________ Movements: ____________ Date: ____________ Start time: ____________  Stop time: ____________ Movements: ____________ Date: ____________ Start time: ____________ Stop time: ____________ Movements: ____________ Date: ____________ Start time: ____________ Stop time: ____________ Movements: ____________ Date: ____________ Start time: ____________ Stop time: ____________ Movements: ____________ Date: ____________ Start time: ____________ Stop time: ____________ Movements: ____________ Date: ____________ Start time: ____________ Stop time: ____________ Movements: ____________ Date: ____________ Start time: ____________ Stop time: ____________ Movements: ____________ This information is not intended to replace advice given to you by your health care provider. Make sure you discuss any questions you have with your health care provider. Document Released: 09/29/2006 Document Revised: 04/28/2016 Document Reviewed: 10/09/2015 Elsevier Interactive Patient Education  2018 [employer]  Braxton Hicks Contractions Contractions of the uterus can occur throughout pregnancy, but they are not always a sign that you are in labor. You may have practice contractions called Braxton Hicks contractions. These false labor contractions are sometimes confused with true labor. What are Braxton Hicks contractions? Braxton Hicks contractions are tightening movements that occur in the muscles of the uterus before labor. Unlike true labor contractions, these contractions do not result in opening (dilation) and thinning of the cervix. Toward the end of pregnancy (32-34 weeks), Braxton Hicks contractions can happen more often and may become stronger. These contractions are sometimes difficult to tell apart from true labor because they can be very uncomfortable. You should not feel embarrassed if you go to the hospital with false labor. Sometimes, the only way to tell if you are in true labor is for your health care provider to look for changes in the cervix. The health care provider will   do a physical  exam and may monitor your contractions. If you are not in true labor, the exam should show that your cervix is not dilating and your water has not broken. If there are other health problems associated with your pregnancy, it is completely safe for you to be sent home with false labor. You may continue to have Braxton Hicks contractions until you go into true labor. How to tell the difference between true labor and false labor True labor  Contractions last 30-70 seconds.  Contractions become very regular.  Discomfort is usually felt in the top of the uterus, and it spreads to the lower abdomen and low back.  Contractions do not go away with walking.  Contractions usually become more intense and increase in frequency.  The cervix dilates and gets thinner. False labor  Contractions are usually shorter and not as strong as true labor contractions.  Contractions are usually irregular.  Contractions are often felt in the front of the lower abdomen and in the groin.  Contractions may go away when you walk around or change positions while lying down.  Contractions get weaker and are shorter-lasting as time goes on.  The cervix usually does not dilate or become thin. Follow these instructions at home:  Take over-the-counter and prescription medicines only as told by your health care provider.  Keep up with your usual exercises and follow other instructions from your health care provider.  Eat and drink lightly if you think you are going into labor.  If Braxton Hicks contractions are making you uncomfortable: ? Change your position from lying down or resting to walking, or change from walking to resting. ? Sit and rest in a tub of warm water. ? Drink enough fluid to keep your urine pale yellow. Dehydration may cause these contractions. ? Do slow and deep breathing several times an hour.  Keep all follow-up prenatal visits as told by your health care provider. This is  important. Contact a health care provider if:  You have a fever.  You have continuous pain in your abdomen. Get help right away if:  Your contractions become stronger, more regular, and closer together.  You have fluid leaking or gushing from your vagina.  You pass blood-tinged mucus (bloody show).  You have bleeding from your vagina.  You have low back pain that you never had before.  You feel your baby's head pushing down and causing pelvic pressure.  Your baby is not moving inside you as much as it used to. Summary  Contractions that occur before labor are called Braxton Hicks contractions, false labor, or practice contractions.  Braxton Hicks contractions are usually shorter, weaker, farther apart, and less regular than true labor contractions. True labor contractions usually become progressively stronger and regular and they become more frequent.  Manage discomfort from Braxton Hicks contractions by changing position, resting in a warm bath, drinking plenty of water, or practicing deep breathing. This information is not intended to replace advice given to you by your health care provider. Make sure you discuss any questions you have with your health care provider. Document Released: 01/13/2017 Document Revised: 01/13/2017 Document Reviewed: 01/13/2017 Elsevier Interactive Patient Education  2018 Elsevier Inc.    

## 2018-06-01 LAB — CBC
HEMOGLOBIN: 11.4 g/dL (ref 11.1–15.9)
Hematocrit: 34.7 % (ref 34.0–46.6)
MCH: 28.6 pg (ref 26.6–33.0)
MCHC: 32.9 g/dL (ref 31.5–35.7)
MCV: 87 fL (ref 79–97)
PLATELETS: 180 10*3/uL (ref 150–450)
RBC: 3.98 x10E6/uL (ref 3.77–5.28)
RDW: 13.1 % (ref 12.3–15.4)
WBC: 12.6 10*3/uL — ABNORMAL HIGH (ref 3.4–10.8)

## 2018-06-01 LAB — GLUCOSE TOLERANCE, 2 HOURS W/ 1HR
GLUCOSE, 1 HOUR: 73 mg/dL (ref 65–179)
Glucose, 2 hour: 68 mg/dL (ref 65–152)
Glucose, Fasting: 61 mg/dL — ABNORMAL LOW (ref 65–91)

## 2018-06-01 LAB — HIV ANTIBODY (ROUTINE TESTING W REFLEX): HIV SCREEN 4TH GENERATION: NONREACTIVE

## 2018-06-01 LAB — RPR: RPR: NONREACTIVE

## 2018-06-15 ENCOUNTER — Ambulatory Visit (INDEPENDENT_AMBULATORY_CARE_PROVIDER_SITE_OTHER): Payer: Medicaid Other | Admitting: Obstetrics and Gynecology

## 2018-06-15 VITALS — BP 103/62 | HR 74 | Wt 115.0 lb

## 2018-06-15 DIAGNOSIS — D573 Sickle-cell trait: Secondary | ICD-10-CM

## 2018-06-15 DIAGNOSIS — O2610 Low weight gain in pregnancy, unspecified trimester: Secondary | ICD-10-CM | POA: Insufficient documentation

## 2018-06-15 DIAGNOSIS — O2613 Low weight gain in pregnancy, third trimester: Secondary | ICD-10-CM

## 2018-06-15 NOTE — Progress Notes (Signed)
   PRENATAL VISIT NOTE  Subjective:  Shelby Serrano is a 22 y.o. [phone] at [redacted]w[redacted]d being seen today for ongoing prenatal care.  She is currently monitored for the following issues for this low-risk pregnancy and has Supervision of normal intrauterine pregnancy in multigravida in third trimester; Sickle cell trait (HCC); and Poor weight gain of pregnancy on their problem list.  Patient reports Concerns about weight gain. Does not feel she is gaining enough weight..  Contractions: Not present. Vag. Bleeding: None.  Movement: Present. Denies leaking of fluid.   The following portions of the patient's history were reviewed and updated as appropriate: allergies, current medications, past family history, past medical history, past social history, past surgical history and problem list. Problem list updated.  Objective:   Vitals:   06/15/18 1537  BP: 103/62  Pulse: 74  Weight: 115 lb (52.2 kg)    Fetal Status: Fetal Heart Rate (bpm): 131 Fundal Height: 30 cm Movement: Present     General:  Alert, oriented and cooperative. Patient is in no acute distress.  Skin: Skin is warm and dry. No rash noted.   Cardiovascular: Normal heart rate noted  Respiratory: Normal respiratory effort, no problems with respiration noted  Abdomen: Soft, gravid, appropriate for gestational age.  Pain/Pressure: Absent     Pelvic: Cervical exam deferred        Extremities: Normal range of motion.  Edema: None  Mental Status: Normal mood and affect. Normal behavior. Normal judgment and thought content.   Assessment and Plan:  Pregnancy: [id number] at [redacted]w[redacted]d  1. Low weight gain during pregnancy in third trimester  15lb weight gain in this pregnancy; discussed that she should gain around 30-35 lbs given her pre-pregnancy weight. Discussed options for increasing protein into her diet. OTC Ensure,  Full fat yogurt and milk. Cheese, avocado's and peanut butter. Patient voiced understanding. I assured her that we would  keep a close eye on her weight.   2. Sickle cell trait (HCC) - Culture, OB Urine today   There are no diagnoses linked to this encounter. Preterm labor symptoms and general obstetric precautions including but not limited to vaginal bleeding, contractions, leaking of fluid and fetal movement were reviewed in detail with the patient. Please refer to After Visit Summary for other counseling recommendations.  Return in about 2 weeks (around 06/29/2018).  Future Appointments  Date Time Provider Department Center  06/29/2018  2:15 PM Rasch, Harolyn Rutherford, NP [facility]    Venia Carbon, NP

## 2018-06-17 LAB — CULTURE, OB URINE

## 2018-06-17 LAB — URINE CULTURE, OB REFLEX

## 2018-06-29 ENCOUNTER — Ambulatory Visit (INDEPENDENT_AMBULATORY_CARE_PROVIDER_SITE_OTHER): Payer: Medicaid Other | Admitting: Obstetrics and Gynecology

## 2018-06-29 VITALS — BP 106/62 | HR 71 | Wt 118.0 lb

## 2018-06-29 DIAGNOSIS — Z3483 Encounter for supervision of other normal pregnancy, third trimester: Secondary | ICD-10-CM

## 2018-06-29 DIAGNOSIS — O2613 Low weight gain in pregnancy, third trimester: Secondary | ICD-10-CM

## 2018-06-29 NOTE — Progress Notes (Signed)
   PRENATAL VISIT NOTE  Subjective:  Shelby Serrano is a 22 y.o. [phone] at [redacted]w[redacted]d being seen today for ongoing prenatal care.  She is currently monitored for the following issues for this low-risk pregnancy and has Supervision of normal intrauterine pregnancy in multigravida in third trimester; Sickle cell trait (HCC); and Poor weight gain of pregnancy on their problem list.  Patient reports no complaints.  Contractions: Not present. Vag. Bleeding: None.  Movement: Present. Denies leaking of fluid.   The following portions of the patient's history were reviewed and updated as appropriate: allergies, current medications, past family history, past medical history, past social history, past surgical history and problem list. Problem list updated.  Objective:   Vitals:   06/29/18 1458  BP: 106/62  Pulse: 71  Weight: 118 lb (53.5 kg)    Fetal Status: Fetal Heart Rate (bpm): 131 Fundal Height: 33 cm Movement: Present     General:  Alert, oriented and cooperative. Patient is in no acute distress.  Skin: Skin is warm and dry. No rash noted.   Cardiovascular: Normal heart rate noted  Respiratory: Normal respiratory effort, no problems with respiration noted  Abdomen: Soft, gravid, appropriate for gestational age.  Pain/Pressure: Present     Pelvic: Cervical exam deferred        Extremities: Normal range of motion.  Edema: None  Mental Status: Normal mood and affect. Normal behavior. Normal judgment and thought content.   Assessment and Plan:  Pregnancy: [id number] at [redacted]w[redacted]d  1. Supervision of normal intrauterine pregnancy in multigravida in third trimester  -Doing well   2. Low weight gain during pregnancy in third trimester  - has been doing high protein, has gained 3 lbs since her last visit.   Preterm labor symptoms and general obstetric precautions including but not limited to vaginal bleeding, contractions, leaking of fluid and fetal movement were reviewed in detail with the  patient. Please refer to After Visit Summary for other counseling recommendations.  No follow-ups on file.  No future appointments.  Venia Carbon, NP

## 2018-07-14 ENCOUNTER — Ambulatory Visit (INDEPENDENT_AMBULATORY_CARE_PROVIDER_SITE_OTHER): Payer: Medicaid Other | Admitting: Obstetrics & Gynecology

## 2018-07-14 VITALS — BP 107/62 | HR 77 | Wt 119.5 lb

## 2018-07-14 DIAGNOSIS — D573 Sickle-cell trait: Secondary | ICD-10-CM

## 2018-07-14 DIAGNOSIS — Z3483 Encounter for supervision of other normal pregnancy, third trimester: Secondary | ICD-10-CM

## 2018-07-14 NOTE — Progress Notes (Signed)
   PRENATAL VISIT NOTE  Subjective:  Shelby Serrano is a 22 y.o. [phone] at [redacted]w[redacted]d being seen today for ongoing prenatal care.  She is currently monitored for the following issues for this high-risk pregnancy and has Supervision of normal intrauterine pregnancy in multigravida in third trimester; Sickle cell trait (HCC); and Poor weight gain of pregnancy on their problem list.  Patient reports no complaints.  Contractions: Not present. Vag. Bleeding: None.  Movement: Present. Denies leaking of fluid.   The following portions of the patient's history were reviewed and updated as appropriate: allergies, current medications, past family history, past medical history, past social history, past surgical history and problem list. Problem list updated.  Objective:   Vitals:   07/14/18 1059  BP: 107/62  Pulse: 77  Weight: 119 lb 8 oz (54.2 kg)    Fetal Status: Fetal Heart Rate (bpm): 127   Movement: Present     General:  Alert, oriented and cooperative. Patient is in no acute distress.  Skin: Skin is warm and dry. No rash noted.   Cardiovascular: Normal heart rate noted  Respiratory: Normal respiratory effort, no problems with respiration noted  Abdomen: Soft, gravid, appropriate for gestational age.  Pain/Pressure: Present     Pelvic: Cervical exam deferred        Extremities: Normal range of motion.  Edema: None  Mental Status: Normal mood and affect. Normal behavior. Normal judgment and thought content.   Assessment and Plan:  Pregnancy: [id number] at [redacted]w[redacted]d  1. Supervision of normal intrauterine pregnancy in multigravida in third trimester   2. Sickle cell trait (HCC) - FOB is negative  Preterm labor symptoms and general obstetric precautions including but not limited to vaginal bleeding, contractions, leaking of fluid and fetal movement were reviewed in detail with the patient. Please refer to After Visit Summary for other counseling recommendations.  Return in about 2 weeks  (around 07/28/2018).  Future Appointments  Date Time Provider Department Center  07/27/2018  2:35 PM Rasch, Harolyn Rutherford, NP [facility]  08/03/2018  2:35 PM Rasch, Harolyn Rutherford, NP [facility]  08/09/2018  2:35 PM Rasch, Harolyn Rutherford, NP [facility]    Allie Bossier, MD

## 2018-07-27 ENCOUNTER — Other Ambulatory Visit (HOSPITAL_COMMUNITY)
Admission: RE | Admit: 2018-07-27 | Discharge: 2018-07-27 | Disposition: A | Discharge status: Home / Self Care | Payer: Medicaid Other | Source: Ambulatory Visit | Attending: Obstetrics and Gynecology | Admitting: Obstetrics and Gynecology

## 2018-07-27 ENCOUNTER — Ambulatory Visit (INDEPENDENT_AMBULATORY_CARE_PROVIDER_SITE_OTHER): Payer: Medicaid Other | Admitting: Obstetrics and Gynecology

## 2018-07-27 VITALS — BP 108/60 | HR 84 | Wt 124.9 lb

## 2018-07-27 DIAGNOSIS — O2613 Low weight gain in pregnancy, third trimester: Secondary | ICD-10-CM

## 2018-07-27 DIAGNOSIS — Z3483 Encounter for supervision of other normal pregnancy, third trimester: Secondary | ICD-10-CM | POA: Insufficient documentation

## 2018-07-27 DIAGNOSIS — M5430 Sciatica, unspecified side: Secondary | ICD-10-CM | POA: Insufficient documentation

## 2018-07-27 LAB — OB RESULTS CONSOLE GC/CHLAMYDIA: GC PROBE AMP, GENITAL: NEGATIVE

## 2018-07-27 LAB — OB RESULTS CONSOLE GBS: GBS: NEGATIVE

## 2018-07-27 MED ORDER — CYCLOBENZAPRINE HCL 10 MG PO TABS: 10.0000 mg | ORAL_TABLET | Freq: Every evening | ORAL | 0 refills | Status: DC | PRN

## 2018-07-27 NOTE — Progress Notes (Signed)
Pt states was told by sister has lower back swelling & still having pain in legs.

## 2018-07-27 NOTE — Progress Notes (Signed)
   PRENATAL VISIT NOTE  Subjective:  Shelby Serrano is a 22 y.o. [phone] at 332w1d being seen today for ongoing prenatal care.  She is currently monitored for the following issues for this low-risk pregnancy and has Supervision of normal intrauterine pregnancy in multigravida in third trimester; Sickle cell trait (HCC); Poor weight gain of pregnancy; and Sciatic nerve pain on their problem list.  Patient reports sciatic nerve pain.   Contractions: Not present. Vag. Bleeding: None.  Movement: Present. Denies leaking of fluid.   The following portions of the patient's history were reviewed and updated as appropriate: allergies, current medications, past family history, past medical history, past social history, past surgical history and problem list. Problem list updated.  Objective:   Vitals:   07/27/18 1438  BP: 108/60  Pulse: 84  Weight: 124 lb 14.4 oz (56.7 kg)    Fetal Status: Fetal Heart Rate (bpm): 132 Fundal Height: 36 cm Movement: Present  Presentation: Undeterminable  General:  Alert, oriented and cooperative. Patient is in no acute distress.  Skin: Skin is warm and dry. No rash noted.   Cardiovascular: Normal heart rate noted  Respiratory: Normal respiratory effort, no problems with respiration noted  Abdomen: Soft, gravid, appropriate for gestational age.  Pain/Pressure: Present     Pelvic: Cervical exam performed Dilation: 1 Effacement (%): Thick Station: -3  Extremities: Normal range of motion.  Edema: None  Mental Status: Normal mood and affect. Normal behavior. Normal judgment and thought content.   Assessment and Plan:  Pregnancy: [id number] at 5532w1d  1. Supervision of normal intrauterine pregnancy in multigravida in third trimester  - [facility] - Culture, beta strep (group b only)  2. Sciatic nerve pain, unspecified laterality  Rx Flexeril PRN bedtime Discussed PT, says she does not want to start now but will consider if  it gets worse.   Preterm labor symptoms and general obstetric precautions including but not limited to vaginal bleeding, contractions, leaking of fluid and fetal movement were reviewed in detail with the patient. Please refer to After Visit Summary for other counseling recommendations.  No follow-ups on file.  Future Appointments  Date Time Provider Department Center  08/03/2018  2:35 PM Rasch, Harolyn RutherfordJennifer I, NP [facility]  08/09/2018  2:35 PM [facility]Jennifer I[facility]    Venia CarbonJennifer Rasch, NP

## 2018-07-28 LAB — GC/CHLAMYDIA PROBE AMP (~~LOC~~) NOT AT ARMC
Chlamydia: NEGATIVE
NEISSERIA GONORRHEA: NEGATIVE

## 2018-07-31 LAB — CULTURE, BETA STREP (GROUP B ONLY): STREP GP B CULTURE: NEGATIVE

## 2018-08-03 ENCOUNTER — Ambulatory Visit (INDEPENDENT_AMBULATORY_CARE_PROVIDER_SITE_OTHER): Payer: Medicaid Other | Admitting: Obstetrics and Gynecology

## 2018-08-03 VITALS — BP 104/60 | HR 71 | Wt 126.0 lb

## 2018-08-03 DIAGNOSIS — O2613 Low weight gain in pregnancy, third trimester: Secondary | ICD-10-CM

## 2018-08-03 DIAGNOSIS — D573 Sickle-cell trait: Secondary | ICD-10-CM

## 2018-08-03 DIAGNOSIS — Z3A37 37 weeks gestation of pregnancy: Secondary | ICD-10-CM

## 2018-08-03 DIAGNOSIS — Z3483 Encounter for supervision of other normal pregnancy, third trimester: Secondary | ICD-10-CM

## 2018-08-03 NOTE — Progress Notes (Signed)
   PRENATAL VISIT NOTE  Subjective:  Shelby Serrano is a 22 y.o. [id number] at 2068w1d being seen today for ongoing prenatal care.  She is currently monitored for the following issues for this low-risk pregnancy and has Supervision of normal intrauterine pregnancy in multigravida in third trimester; Sickle cell trait (HCC); Poor weight gain of pregnancy; and Sciatic nerve pain on their problem list.  Patient reports no complaints.  Contractions: Not present. Vag. Bleeding: None.  Movement: Present. Denies leaking of fluid.   The following portions of the patient's history were reviewed and updated as appropriate: allergies, current medications, past family history, past medical history, past social history, past surgical history and problem list. Problem list updated.  Objective:   Vitals:   08/03/18 1438  BP: 104/60  Pulse: 71  Weight: 126 lb (57.2 kg)    Fetal Status: Fetal Heart Rate (bpm): 152 Fundal Height: 36 cm Movement: Present     General:  Alert, oriented and cooperative. Patient is in no acute distress.  Skin: Skin is warm and dry. No rash noted.   Cardiovascular: Normal heart rate noted  Respiratory: Normal respiratory effort, no problems with respiration noted  Abdomen: Soft, gravid, appropriate for gestational age.  Pain/Pressure: Present     Pelvic: Cervical exam deferred        Extremities: Normal range of motion.  Edema: None  Mental Status: Normal mood and affect. Normal behavior. Normal judgment and thought content.   Assessment and Plan:  Pregnancy: [id number] at 8068w1d  1. Supervision of normal intrauterine pregnancy in multigravida in third trimester  - Doing well.   2. Sickle cell trait (HCC)   3. Low weight gain during pregnancy in third trimester  - Gained another 2 lbs since last visit. Continue high protein.   There are no diagnoses linked to this encounter. Term labor symptoms and general obstetric precautions including but not limited to vaginal  bleeding, contractions, leaking of fluid and fetal movement were reviewed in detail with the patient. Please refer to After Visit Summary for other counseling recommendations.  Return in about 1 week (around 08/10/2018).  Future Appointments  Date Time Provider Department Center  08/09/2018  2:35 PM Rasch, Harolyn RutherfordJennifer I, NP [facility]    Venia CarbonJennifer Rasch, NP

## 2018-08-09 ENCOUNTER — Ambulatory Visit (INDEPENDENT_AMBULATORY_CARE_PROVIDER_SITE_OTHER): Payer: Medicaid Other | Admitting: Obstetrics and Gynecology

## 2018-08-09 VITALS — BP 110/74 | HR 76 | Wt 126.8 lb

## 2018-08-09 DIAGNOSIS — Z3483 Encounter for supervision of other normal pregnancy, third trimester: Secondary | ICD-10-CM

## 2018-08-09 DIAGNOSIS — O2613 Low weight gain in pregnancy, third trimester: Secondary | ICD-10-CM

## 2018-08-09 DIAGNOSIS — B370 Candidal stomatitis: Secondary | ICD-10-CM

## 2018-08-09 DIAGNOSIS — Z3A38 38 weeks gestation of pregnancy: Secondary | ICD-10-CM

## 2018-08-09 MED ORDER — CLOTRIMAZOLE 1 % EX CREA: 1.0000 "application " | TOPICAL_CREAM | Freq: Two times a day (BID) | CUTANEOUS | 0 refills | Status: DC

## 2018-08-09 MED ORDER — NYSTATIN 100000 UNIT/ML MT SUSP: 5.0000 mL | Freq: Four times a day (QID) | OROMUCOSAL | 0 refills | Status: DC

## 2018-08-09 NOTE — Progress Notes (Signed)
   PRENATAL VISIT NOTE  Subjective:  Shelby Serrano is a 22 y.o. [phone] at 1562w0d being seen today for ongoing prenatal care.  She is currently monitored for the following issues for this low-risk pregnancy and has Supervision of normal intrauterine pregnancy in multigravida in third trimester; Sickle cell trait (HCC); Poor weight gain of pregnancy; Sciatic nerve pain; and Thrush, oral on their problem list.  Patient reports changes to her tongue and upper back. No itching or pain, just appears different .  Contractions: Not present. Vag. Bleeding: None.  Movement: Present. Denies leaking of fluid.   The following portions of the patient's history were reviewed and updated as appropriate: allergies, current medications, past family history, past medical history, past social history, past surgical history and problem list. Problem list updated.  Objective:   Vitals:   08/09/18 1520  BP: 110/74  Pulse: 76  Weight: 126 lb 12.8 oz (57.5 kg)    Fetal Status: Fetal Heart Rate (bpm): 127 Fundal Height: 38 cm Movement: Present     General:  Alert, oriented and cooperative. Patient is in no acute distress.  Skin: Skin is warm and dry. No rash noted.   Cardiovascular: Normal heart rate noted  Respiratory: Normal respiratory effort, no problems with respiration noted  Abdomen: Soft, gravid, appropriate for gestational age.  Pain/Pressure: Present  Upper back near scapula there are multiple patchy areas of candida. Candida also noted on tongue.    Pelvic: Cervical exam deferred        Extremities: Normal range of motion.  Edema: None  Mental Status: Normal mood and affect. Normal behavior. Normal judgment and thought content.   Assessment and Plan:  Pregnancy: [id number] at 4762w0d  1. 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 Low weight gain during pregnancy in third trimester  - Dong well   3. Supervision of normal intrauterine  pregnancy in multigravida in third trimester  There are no diagnoses linked to this encounter. Preterm labor symptoms and general obstetric precautions including but not limited to vaginal bleeding, contractions, leaking of fluid and fetal movement were reviewed in detail with the patient. Please refer to After Visit Summary for other counseling recommendations.  Return in about 1 week (around 08/16/2018).  Future Appointments  Date Time Provider Department Center  08/16/2018  1:55 PM Burleson[facility]ri L[facility]    Venia CarbonJennifer Caron Tardif, NP

## 2018-08-16 ENCOUNTER — Ambulatory Visit (INDEPENDENT_AMBULATORY_CARE_PROVIDER_SITE_OTHER): Payer: Medicaid Other | Admitting: Nurse Practitioner

## 2018-08-16 VITALS — BP 114/67 | HR 86 | Wt 127.1 lb

## 2018-08-16 DIAGNOSIS — Z3483 Encounter for supervision of other normal pregnancy, third trimester: Secondary | ICD-10-CM

## 2018-08-16 NOTE — Progress Notes (Signed)
    Subjective:  Shelby Serrano is a 22 y.o. [phone] at 6857w0d being seen today for ongoing prenatal care.  She is currently monitored for the following issues for this low-risk pregnancy and has Supervision of normal intrauterine pregnancy in multigravida in third trimester; Sickle cell trait (HCC); Poor weight gain of pregnancy; Sciatic nerve pain; and Thrush, oral on their problem list.  Patient reports no complaints.  Contractions: Irritability. Vag. Bleeding: None.  Movement: Present. Denies leaking of fluid.   The following portions of the patient's history were reviewed and updated as appropriate: allergies, current medications, past family history, past medical history, past social history, past surgical history and problem list. Problem list updated.  Objective:   Vitals:   08/16/18 1404  BP: 114/67  Pulse: 86  Weight: 127 lb 1.6 oz (57.7 kg)    Fetal Status: Fetal Heart Rate (bpm): 124 Fundal Height: 36 cm Movement: Present  Presentation: Vertex  General:  Alert, oriented and cooperative. Patient is in no acute distress.  Skin: Skin is warm and dry. No rash noted.   Cardiovascular: Normal heart rate noted  Respiratory: Normal respiratory effort, no problems with respiration noted  Abdomen: Soft, gravid, appropriate for gestational age. Pain/Pressure: Present     Pelvic:  Cervical exam performed Dilation: 1 Effacement (%): Thick Station: -3  Extremities: Normal range of motion.  Edema: None  Mental Status: Normal mood and affect. Normal behavior. Normal judgment and thought content.   Urinalysis:      Assessment and Plan:  Pregnancy: [id number] at 2357w0d  1. Supervision of normal intrauterine pregnancy in multigravida in third trimester Will schedule for induction at 41 weeks - 08-30-18 at 7 am Will see again next week and decide if she needs a foley bulb and if so give instructions to patient. Discussed contraception - unsure considering BTL (age 22 and did not sign  papers during pregnancy) - declines IUD - considering Depo  Term labor symptoms and general obstetric precautions including but not limited to vaginal bleeding, contractions, leaking of fluid and fetal movement were reviewed in detail with the patient. Please refer to After Visit Summary for other counseling recommendations.  Return in about 1 week (around 08/23/2018).  Nolene BernheimERRI BURLESON, RN, MSN, NP-BC Nurse Practitioner, [facility], [facility] 08/16/2018 2:35 PM

## 2018-08-17 ENCOUNTER — Inpatient Hospital Stay (HOSPITAL_COMMUNITY)
Admission: AD | Admit: 2018-08-17 | Discharge: 2018-08-19 | DRG: 806 | Disposition: A | Discharge status: Home / Self Care | Payer: Medicaid Other | Attending: Obstetrics & Gynecology | Admitting: Obstetrics & Gynecology

## 2018-08-17 ENCOUNTER — Encounter (HOSPITAL_COMMUNITY): Payer: Self-pay | Admitting: *Deleted

## 2018-08-17 DIAGNOSIS — D573 Sickle-cell trait: Secondary | ICD-10-CM | POA: Diagnosis present

## 2018-08-17 DIAGNOSIS — O9902 Anemia complicating childbirth: Secondary | ICD-10-CM | POA: Diagnosis present

## 2018-08-17 DIAGNOSIS — Z3A39 39 weeks gestation of pregnancy: Secondary | ICD-10-CM | POA: Diagnosis not present

## 2018-08-17 DIAGNOSIS — M5431 Sciatica, right side: Secondary | ICD-10-CM | POA: Diagnosis present

## 2018-08-17 DIAGNOSIS — Z87891 Personal history of nicotine dependence: Secondary | ICD-10-CM | POA: Diagnosis not present

## 2018-08-17 DIAGNOSIS — B370 Candidal stomatitis: Secondary | ICD-10-CM

## 2018-08-17 DIAGNOSIS — M7989 Other specified soft tissue disorders: Secondary | ICD-10-CM | POA: Diagnosis not present

## 2018-08-17 DIAGNOSIS — Z3483 Encounter for supervision of other normal pregnancy, third trimester: Secondary | ICD-10-CM | POA: Diagnosis present

## 2018-08-17 DIAGNOSIS — O26893 Other specified pregnancy related conditions, third trimester: Secondary | ICD-10-CM | POA: Diagnosis present

## 2018-08-17 DIAGNOSIS — R52 Pain, unspecified: Secondary | ICD-10-CM | POA: Diagnosis not present

## 2018-08-17 MED ORDER — MISOPROSTOL 200 MCG PO TABS
400.0000 ug | ORAL_TABLET | Freq: Once | ORAL | Status: AC
  Administered 2018-08-17: 400 ug via ORAL

## 2018-08-17 MED ORDER — MISOPROSTOL 200 MCG PO TABS
ORAL_TABLET | ORAL | Status: AC
  Filled 2018-08-17: qty 4

## 2018-08-17 MED ORDER — ONDANSETRON HCL 4 MG PO TABS: 4.0000 mg | ORAL_TABLET | ORAL | Status: DC | PRN

## 2018-08-17 MED ORDER — COCONUT OIL OIL: 1.0000 "application " | TOPICAL_OIL | Status: DC | PRN

## 2018-08-17 MED ORDER — IBUPROFEN 600 MG PO TABS: 600.0000 mg | ORAL_TABLET | Freq: Four times a day (QID) | ORAL | Status: DC

## 2018-08-17 MED ORDER — BENZOCAINE-MENTHOL 20-0.5 % EX AERO
1.0000 "application " | INHALATION_SPRAY | CUTANEOUS | Status: DC | PRN
  Filled 2018-08-17: qty 56

## 2018-08-17 MED ORDER — IBUPROFEN 600 MG PO TABS
600.0000 mg | ORAL_TABLET | Freq: Four times a day (QID) | ORAL | Status: DC
  Administered 2018-08-17 – 2018-08-19 (×8): 600 mg via ORAL
  Filled 2018-08-17 (×8): qty 1

## 2018-08-17 MED ORDER — TETANUS-DIPHTH-ACELL PERTUSSIS 5-2.5-18.5 LF-MCG/0.5 IM SUSP: 0.5000 mL | Freq: Once | INTRAMUSCULAR | Status: DC

## 2018-08-17 MED ORDER — WITCH HAZEL-GLYCERIN EX PADS: 1.0000 "application " | MEDICATED_PAD | CUTANEOUS | Status: DC | PRN

## 2018-08-17 MED ORDER — DIBUCAINE 1 % RE OINT: 1.0000 "application " | TOPICAL_OINTMENT | RECTAL | Status: DC | PRN

## 2018-08-17 MED ORDER — COCONUT OIL OIL
1.0000 "application " | TOPICAL_OIL | Status: DC | PRN
  Filled 2018-08-17: qty 120

## 2018-08-17 MED ORDER — SIMETHICONE 80 MG PO CHEW: 80.0000 mg | CHEWABLE_TABLET | ORAL | Status: DC | PRN

## 2018-08-17 MED ORDER — BENZOCAINE-MENTHOL 20-0.5 % EX AERO: 1.0000 "application " | INHALATION_SPRAY | CUTANEOUS | Status: DC | PRN

## 2018-08-17 MED ORDER — DIPHENHYDRAMINE HCL 25 MG PO CAPS: 25.0000 mg | ORAL_CAPSULE | Freq: Four times a day (QID) | ORAL | Status: DC | PRN

## 2018-08-17 MED ORDER — ONDANSETRON HCL 4 MG/2ML IJ SOLN: 4.0000 mg | INTRAMUSCULAR | Status: DC | PRN

## 2018-08-17 MED ORDER — ACETAMINOPHEN 325 MG PO TABS
650.0000 mg | ORAL_TABLET | ORAL | Status: DC | PRN
  Administered 2018-08-17 (×2): 650 mg via ORAL
  Filled 2018-08-17 (×2): qty 2

## 2018-08-17 MED ORDER — TIZANIDINE HCL 4 MG PO TABS
4.0000 mg | ORAL_TABLET | Freq: Three times a day (TID) | ORAL | Status: DC | PRN
  Administered 2018-08-17 – 2018-08-18 (×2): 4 mg via ORAL
  Filled 2018-08-17 (×4): qty 1

## 2018-08-17 MED ORDER — ACETAMINOPHEN 325 MG PO TABS: 650.0000 mg | ORAL_TABLET | ORAL | Status: DC | PRN

## 2018-08-17 MED ORDER — SENNOSIDES-DOCUSATE SODIUM 8.6-50 MG PO TABS: 2.0000 | ORAL_TABLET | ORAL | Status: DC

## 2018-08-17 MED ORDER — SENNOSIDES-DOCUSATE SODIUM 8.6-50 MG PO TABS
2.0000 | ORAL_TABLET | ORAL | Status: DC
  Administered 2018-08-17: 2 via ORAL
  Filled 2018-08-17: qty 2

## 2018-08-17 MED ORDER — OXYTOCIN 40 UNITS IN LACTATED RINGERS INFUSION - SIMPLE MED
INTRAVENOUS | Status: AC
  Administered 2018-08-17: 08:00:00
  Filled 2018-08-17: qty 1000

## 2018-08-17 MED ORDER — OXYCODONE HCL 5 MG PO TABS: 10.0000 mg | ORAL_TABLET | ORAL | Status: DC | PRN

## 2018-08-17 MED ORDER — SENNOSIDES-DOCUSATE SODIUM 8.6-50 MG PO TABS
2.0000 | ORAL_TABLET | ORAL | Status: DC
  Administered 2018-08-19: 2 via ORAL
  Filled 2018-08-17: qty 2

## 2018-08-17 MED ORDER — IBUPROFEN 600 MG PO TABS
600.0000 mg | ORAL_TABLET | Freq: Four times a day (QID) | ORAL | Status: DC
  Filled 2018-08-17: qty 1

## 2018-08-17 MED ORDER — PRENATAL MULTIVITAMIN CH
1.0000 | ORAL_TABLET | Freq: Every day | ORAL | Status: DC
  Administered 2018-08-18 – 2018-08-19 (×2): 1 via ORAL
  Filled 2018-08-17 (×3): qty 1

## 2018-08-17 MED ORDER — PRENATAL MULTIVITAMIN CH: 1.0000 | ORAL_TABLET | Freq: Every day | ORAL | Status: DC

## 2018-08-17 MED ORDER — DIBUCAINE 1 % RE OINT
1.0000 "application " | TOPICAL_OINTMENT | RECTAL | Status: DC | PRN
  Filled 2018-08-17: qty 28

## 2018-08-17 MED ORDER — OXYCODONE HCL 5 MG PO TABS
5.0000 mg | ORAL_TABLET | ORAL | Status: DC | PRN
  Administered 2018-08-17 – 2018-08-19 (×4): 5 mg via ORAL
  Filled 2018-08-17 (×4): qty 1

## 2018-08-17 MED ORDER — MEASLES, MUMPS & RUBELLA VAC IJ SOLR
0.5000 mL | Freq: Once | INTRAMUSCULAR | Status: DC
  Filled 2018-08-17: qty 0.5

## 2018-08-17 MED ORDER — ZOLPIDEM TARTRATE 5 MG PO TABS: 5.0000 mg | ORAL_TABLET | Freq: Every evening | ORAL | Status: DC | PRN

## 2018-08-17 NOTE — MAU Note (Signed)
Pt presents to MAU with complaints of contractions since midnight.   

## 2018-08-17 NOTE — H&P (Addendum)
OBSTETRIC ADMISSION HISTORY AND PHYSICAL  Shelby Serrano is a 22 y.o. female [phone] with IUP at [redacted]w[redacted]d presenting for management of SOL. Arrived at 10 cm dilation with sensation of pressure and urge to push in the MAU and delivered spontaneously there as described in the delivery note.   She reports +FMs. No LOF, VB, blurry vision, headaches, peripheral edema, or RUQ pain. She plans on formula feeding. She requests pills for birth control. She had used depo in the past but became pregnant this time while on depo.  Reports R sciatic pain that has been present throughout pregnancy with pain through her back worse with movement. Took muscle relaxer during pregnancy for this with some relief.   Dating: By L/5 --->  Estimated Date of Delivery: 08/23/18  Sono:   @[redacted]w[redacted]d , CWD, normal anatomy, breech presentation, 649g, 53%ile  Prenatal History/Complications: None  Past Medical History: Past Medical History:  Diagnosis Date  . Sickle cell trait (HCC)     Past Surgical History: No past surgical history on file.  Obstetrical History: OB History    Gravida  6   Para  3   Term  3   Preterm  0   AB  3   Living  3     SAB  3   TAB  0   Ectopic  0   Multiple  0   Live Births  3           Social History: Social History   Socioeconomic History  . Marital status: Single    Spouse name: Not on file  . Number of children: Not on file  . Years of education: Not on file  . Highest education level: Not on file  Occupational History  . Not on file  Social Needs  . Financial resource strain: Not on file  . Food insecurity:    Worry: Not on file    Inability: Not on file  . Transportation needs:    Medical: Not on file    Non-medical: Not on file  Tobacco Use  . Smoking status: Former Smoker    Packs/day: 1.00    Types: Cigarettes    Last attempt to quit: 02/08/2016    Years since quitting: 2.5  . Smokeless tobacco: Never Used  Substance and Sexual Activity   . Alcohol use: No  . Drug use: Yes    Types: Marijuana    Comment: Patient denies, Positive UDS 05/04/16 [facility] ED  . Sexual activity: Yes    Birth control/protection: Injection  Lifestyle  . Physical activity:    Days per week: Not on file    Minutes per session: Not on file  . Stress: Not on file  Relationships  . Social connections:    Talks on phone: Not on file    Gets together: Not on file    Attends religious service: Not on file    Active member of club or organization: Not on file    Attends meetings of clubs or organizations: Not on file    Relationship status: Not on file  Other Topics Concern  . Not on file  Social History Narrative  . Not on file    Family History: Family History  Problem Relation Age of Onset  . Diabetes Mother   . Hypertension Mother     Allergies: No Known Allergies  Medications Prior to Admission  Medication Sig Dispense Refill Last Dose  . acetaminophen (TYLENOL) 325 MG tablet Take 650 mg by  mouth every 6 (six) hours as needed.   Taking  . clotrimazole (LOTRIMIN) 1 % cream Apply 1 application topically 2 (two) times daily. Apply to back (Patient not taking: Reported on 08/16/2018) 30 g 0 Not Taking  . cyclobenzaprine (FLEXERIL) 10 MG tablet Take 1 tablet (10 mg total) by mouth at bedtime as needed for muscle spasms. 30 tablet 0 Taking  . nystatin (MYCOSTATIN) 100000 UNIT/ML suspension Take 5 mLs (500,000 Units total) by mouth 4 (four) times daily. Swish and spit (Patient not taking: Reported on 08/16/2018) 60 mL 0 Not Taking  . Prenatal Multivit-Min-Fe-FA (PRENATAL VITAMINS) 0.8 MG tablet Take 1 tablet by mouth daily. 30 tablet 12 Taking     Review of Systems   All systems reviewed and negative except as stated in HPI  Blood pressure (!) 96/54, pulse (!) 113, temperature 98.2 F (36.8 C), resp. rate 16, last menstrual period 12/22/2017, SpO2 100 %, unknown if currently breastfeeding. General appearance: alert, cooperative and no  distress Lungs: regular rate and effort Heart: regular rate  Abdomen: soft, non-tender Extremities: Homans sign is negative, no sign of DVT Fetal: Delivered without complication.   At the time of arrival: Dilation: 10 Exam by:: ginger morris rn   Prenatal labs: ABO, Rh: O/Positive/-- (06/05 1412) Antibody: Negative (06/05 1412) Rubella: 2.61 (06/05 1412) RPR: Non Reactive (09/18 0834)  HBsAg: Negative (06/05 1412)  HIV: Non Reactive (09/18 0834)  GBS: Negative (11/14 0000)  2 hr GTT normal  Prenatal Transfer Tool  Maternal Diabetes: No Genetic Screening: Normal Maternal Ultrasounds/Referrals: Normal Fetal Ultrasounds or other Referrals:  None Maternal Substance Abuse:  No Significant Maternal Medications:  None Significant Maternal Lab Results: None  No results found for this or any previous visit (from the past 24 hour(s)).  Patient Active Problem List   Diagnosis Date Noted  . Normal labor 08/17/2018  . Postpartum care following vaginal delivery 08/17/2018  . Thrush, oral 08/09/2018  . Sciatic nerve pain 07/27/2018  . Poor weight gain of pregnancy 06/15/2018  . Supervision of normal intrauterine pregnancy in multigravida in third trimester 02/15/2018  . Sickle cell trait (HCC) 02/15/2018    Assessment: Shelby Serrano is a 22 y.o. [phone] at 7853w1d who presented with SOL complete and with urge to push in the MAU. Has SVD delivery there as detailed in the delivery note.  1. Labor: Delivered. 2. FWB:  3. Pain: No pain control at the time of delivery. Reports general soreness and R sciatic pain. 4. GBS: Negative   Plan: Routine postpartum care. Monitor for bleeding with high suspicion for retained membranes if bleeding occurs (see delivery note). Zanaflex for muscle spasms and sciatic pain.    Awilda MetroSarah A Peiffer, MD  08/17/2018, 10:03 AM   I confirm that I have verified the information documented in the resident's note and that I have also personally  reperformed the physical exam and all medical decision making activities. The patient was seen and examined by me also Agree with note NST reactive and reassuring UCs as listed Cervical exams as listed in note  Aviva SignsWilliams, Biridiana Twardowski L, CNM

## 2018-08-17 NOTE — Progress Notes (Addendum)
Patient still complaining of pain of 5/10 after oxy IR given to patient. Patient states lower back pain and pain down her right leg.  Called Dr. Gerri LinsPeiffer and she is coming over to assess patient. Gave pt heat packs and pt states, "That feels better already."  Will continue to monitor.

## 2018-08-18 ENCOUNTER — Inpatient Hospital Stay (HOSPITAL_COMMUNITY): Payer: Medicaid Other

## 2018-08-18 DIAGNOSIS — R52 Pain, unspecified: Secondary | ICD-10-CM

## 2018-08-18 DIAGNOSIS — M7989 Other specified soft tissue disorders: Secondary | ICD-10-CM

## 2018-08-18 LAB — BASIC METABOLIC PANEL
ANION GAP: 7 (ref 5–15)
BUN: 11 mg/dL (ref 6–20)
CHLORIDE: 103 mmol/L (ref 98–111)
CO2: 26 mmol/L (ref 22–32)
Calcium: 9.1 mg/dL (ref 8.9–10.3)
Creatinine, Ser: 0.92 mg/dL (ref 0.44–1.00)
GFR calc Af Amer: >60 mL/min (ref >60)
GFR calc non Af Amer: >60 mL/min (ref >60)
Glucose, Bld: 89 mg/dL (ref 70–99)
POTASSIUM: 4.7 mmol/L (ref 3.5–5.1)
Sodium: 136 mmol/L (ref 135–145)

## 2018-08-18 LAB — RPR: RPR Ser Ql: NONREACTIVE

## 2018-08-18 MED ORDER — TIZANIDINE HCL 4 MG PO TABS: 4.0000 mg | ORAL_TABLET | Freq: Three times a day (TID) | ORAL | 1 refills | Status: AC

## 2018-08-18 MED ORDER — IBUPROFEN 600 MG PO TABS: 600.0000 mg | ORAL_TABLET | Freq: Four times a day (QID) | ORAL | 0 refills | Status: DC

## 2018-08-18 MED ORDER — SENNOSIDES-DOCUSATE SODIUM 8.6-50 MG PO TABS: 2.0000 | ORAL_TABLET | ORAL | 0 refills | Status: AC

## 2018-08-18 NOTE — Discharge Summary (Addendum)
Postpartum Discharge Summary     Patient Name: Shelby Serrano DOB: 10-11-1995 MRN: [id number]  Date of admission: 08/17/2018 Delivering Provider: Aviva Signs   Date of discharge: 08/19/18  Admitting diagnosis: 39WKS PRESSURE Intrauterine pregnancy: [redacted]w[redacted]d     Secondary diagnosis:  Active Problems:   Normal labor   Postpartum care following vaginal delivery  Additional problems: Sciatic Back Pain     Discharge diagnosis: Term Pregnancy Delivered                                                                                                Post partum procedures:None  Augmentation: None  Complications: Hemorrhage>1028mL - 600 mL requiring Cytotec after Pitocin  Hospital course:  Onset of Labor With Vaginal Delivery     22 y.o. yo [id number] at [redacted]w[redacted]d was admitted in Active Labor on 08/17/2018. Was determined complete with urge to push in triage and delivered precipitously there. Patient had an uncomplicated labor course as follows:  Membrane Rupture Time/Date: 8:08 AM ,08/17/2018   Intrapartum Procedures: Episiotomy: None [1]                                         Lacerations:     Patient had a delivery of a Viable infant. 08/17/2018  Information for the patient's newborn:  Akina, Maish [[id number]]  Delivery Method: Vaginal, Spontaneous(Filed from Delivery Summary)    Pateint had an uncomplicated postpartum course.  She is ambulating, tolerating a regular diet, passing flatus, and urinating well. Patient is discharged home in stable condition on 08/18/18.   Magnesium Sulfate recieved: No BMZ received: No  Physical exam  Vitals:   08/17/18 1500 08/17/18 1830 08/17/18 2326 08/18/18 0512  BP: 98/62 106/68 (!) 110/54 109/75  Pulse: 76 70 68 65  Resp: 16 18 20 19   Temp: 98.7 F (37.1 C) 98.9 F (37.2 C) 97.8 F (36.6 C) 97.6 F (36.4 C)  TempSrc: Oral Oral Oral Oral  SpO2:       General: alert, cooperative and no distress Lochia: appropriate Uterine  Fundus: firm Incision: N/A DVT Evaluation: calf tenderness with pain on dorsiflexion, negative doppler prior to discharge. Labs: Lab Results  Component Value Date   WBC 12.6 (H) 05/31/2018   HGB 11.4 05/31/2018   HCT 34.7 05/31/2018   MCV 87 05/31/2018   PLT 180 05/31/2018   CMP Latest Ref Rng & Units 05/04/2016  Glucose 65 - 99 mg/dL 74  BUN 6 - 20 mg/dL 6  Creatinine 1.30 - 8.65 mg/dL 7.84  Sodium 696 - 295 mmol/L 135  Potassium 3.5 - 5.1 mmol/L 3.5  Chloride 101 - 111 mmol/L 108  CO2 22 - 32 mmol/L 21(L)  Calcium 8.9 - 10.3 mg/dL 2.8(U)  Total Protein 6.5 - 8.1 g/dL 1.3(K)  Total Bilirubin 0.3 - 1.2 mg/dL 0.6  Alkaline Phos 38 - 126 U/L 114  AST 15 - 41 U/L 19  ALT 14 - 54 U/L 11(L)    Discharge instruction: per After Visit  Summary and "Baby and Me Booklet".  After visit meds:  Allergies as of 08/18/2018   No Known Allergies     Medication List    STOP taking these medications   cyclobenzaprine 10 MG tablet Commonly known as:  FLEXERIL     TAKE these medications   acetaminophen 325 MG tablet Commonly known as:  TYLENOL Take 650 mg by mouth every 6 (six) hours as needed for mild pain or headache.   ibuprofen 600 MG tablet Commonly known as:  ADVIL,MOTRIN Take 1 tablet (600 mg total) by mouth every 6 (six) hours.   Prenatal Vitamins 0.8 MG tablet Take 1 tablet by mouth daily.   senna-docusate 8.6-50 MG tablet Commonly known as:  Senokot-S Take 2 tablets by mouth daily for 7 days.   tiZANidine 4 MG tablet Commonly known as:  ZANAFLEX Take 1 tablet (4 mg total) by mouth 3 (three) times daily.       Diet: routine diet  Activity: Advance as tolerated. Pelvic rest for 6 weeks.   Outpatient follow up:4 weeks Follow up Appt: Future Appointments  Date Time Provider Department Center  08/23/2018  2:15 PM Burleson, Brand Maleserri L, NP [facility]   Follow up Visit:   Please schedule this patient for Postpartum visit in: 4 weeks with the following provider:  MD For C/S patients schedule nurse incision check in weeks 2 weeks: no Low risk pregnancy complicated by: None Delivery mode:  SVD Anticipated Birth Control:  OCPs PP Procedures needed: None   Newborn Data: Live born female  Birth Weight: 6 lb 0.8 oz (2745 g) APGAR: 9, 9  Newborn Delivery   Time head delivered:  08/17/2018 08:08:00 Birth date/time:  08/17/2018 08:08:00 Delivery type:  Vaginal, Spontaneous     Baby Feeding: Bottle Disposition:home with mother   08/18/2018 Awilda MetroSarah A Peiffer, MD  OB FELLOW DISCHARGE ATTESTATION  I have seen and examined this patient and agree with above documentation in the resident's note.   Gwenevere AbbotNimeka Phillip, MD OB Fellow  08/18/2018, 3:15 PM `````Attestation of Attending Supervision of Advanced Practitioner: Evaluation and management procedures were performed by the PA/NP/CNM/OB Fellow under my supervision/collaboration. Chart reviewed and agree with management and plan. Pt needs Depo appt early as she is bottle feeding exclusively. Tilda BurrowJohn V Carli Lefevers 08/19/2018 2:16 PM

## 2018-08-18 NOTE — Progress Notes (Signed)
Left lower extremity venous duplex exam completed. Please seen preliminary notes on CV PROC under chart review. Tarryn Bogdan H March Joos(RDMS RVT) 08/18/18 12:03 PM

## 2018-08-18 NOTE — Progress Notes (Signed)
Post Partum Day 1 Subjective: no complaints, up ad lib, voiding and tolerating PO  Reports continued back pain radiating down legs. On exam pain in L lower extremity describes as starting in sacrum and radiating around abdomen and down each leg with movement. Desires discharge.  Objective: Blood pressure 109/75, pulse 65, temperature 97.6 F (36.4 C), temperature source Oral, resp. rate 19, last menstrual period 12/22/2017, SpO2 100 %, unknown if currently breastfeeding. Bottlefeeding  Physical Exam:  General: alert, cooperative and no distress Lochia: Greater than a period, reports history of increased bleeding with prior delivery. Uterine Fundus: firm Incision: NA DVT Evaluation: No edema or asymmetric warmth. Mild tenderness L lower extremity with report of slight discomfort on dorsiflexion of the L foot. Patient describes as extension of lower back pain.  No results for input(s): HGB, HCT in the last 72 hours.  Assessment/Plan: Plan for discharge tomorrow or late today. -Patient with continued bleeding >typical period after delivery with 600 mL blood loss and concern for retained placental membranes if bleeding continues. Agreeable to stay at least through this afternoon to monitor for continued reduction in lochia. -Calf pain on palpation and dorsiflexion but without swelling or erythema concerning for DVT vs chronic leg pain per patient's description of radiating low back pain. Consider doppler ultrasound to rule out DVT. -Patient currently requiring oxycodone for management of back pain and leg pain in sciatic distribution likely muscle sprain from delivery vs continued chronic sciatic pain described during prenatal course. Recommend combination ibuprofen/Tylenol with Zanaflex at discharge due to reported patient improvement with muscle relaxer.   LOS: 1 day   Awilda MetroSarah A Rozell Kettlewell 08/18/2018, 7:48 AM

## 2018-08-19 MED ORDER — PRENATAL VITAMINS 0.8 MG PO TABS: 1.0000 | ORAL_TABLET | Freq: Every day | ORAL | 12 refills | Status: DC

## 2018-08-23 ENCOUNTER — Encounter: Payer: Self-pay | Admitting: Nurse Practitioner

## 2018-08-30 ENCOUNTER — Inpatient Hospital Stay (HOSPITAL_COMMUNITY): Payer: Medicaid Other

## 2018-09-11 ENCOUNTER — Other Ambulatory Visit: Payer: Self-pay | Admitting: *Deleted

## 2018-09-11 ENCOUNTER — Encounter: Payer: Self-pay | Admitting: *Deleted

## 2018-09-11 NOTE — Progress Notes (Signed)
error 

## 2018-09-11 NOTE — Telephone Encounter (Signed)
Received fax for refill of cyclobenzaprine. Forwarded to provider.

## 2018-09-11 NOTE — Progress Notes (Signed)
Opened in error

## 2018-09-19 ENCOUNTER — Encounter: Payer: Self-pay | Admitting: *Deleted

## 2018-09-19 ENCOUNTER — Ambulatory Visit (INDEPENDENT_AMBULATORY_CARE_PROVIDER_SITE_OTHER): Payer: Medicaid Other | Admitting: Student

## 2018-09-19 ENCOUNTER — Encounter: Payer: Self-pay | Admitting: Student

## 2018-09-19 DIAGNOSIS — Z1389 Encounter for screening for other disorder: Secondary | ICD-10-CM | POA: Diagnosis not present

## 2018-09-19 DIAGNOSIS — Z30011 Encounter for initial prescription of contraceptive pills: Secondary | ICD-10-CM

## 2018-09-19 MED ORDER — NORETHINDRONE 0.35 MG PO TABS: 1.0000 | ORAL_TABLET | Freq: Every day | ORAL | 11 refills | Status: DC

## 2018-09-19 NOTE — Progress Notes (Signed)
Pt wants Birth Control Pills, states bleed to much on Depo.

## 2018-09-19 NOTE — Patient Instructions (Addendum)
[place] (Revised August 2014)    Insufficient Money for Medicine:           [address]: call "211"   . MAP Program at [place] [phone] or [place] [phone]            No Primary Care Doctor:  To locate a primary care doctor that accepts your insurance or provides certain services:           [place]: [phone]           Physician Referral Service: [phone] ask for "My [place]" . If no insurance, you need to see if you qualify for [facility] "orange card", call to set      up appointment for eligibility/enrollment at [phone] or [phone] or visit [place] ([address], [place] and [place]) to meet with a [facility] enrollment specialist.  Agencies that provide inexpensive (sliding fee scale) medical care:   Marland Kitchen    [facility] - Family Medicine at [facility] .    [facility] and [place] [phone] .    [facility] [place] [facility] - [phone] .    [place] [phone] .    [address] for [place] [phone] .    [place] [phone] . [facility] - [place] @ [place] [phone]-     [phone] . [facility] - [place] @ [place] [phone] . [facility]: [phone]  . Women's Clinic: [phone]  . Planned Parenthood: [phone]  . Family Services of the [place] Providers:           [facility] - [phone] (No Family Planning accepted)          [address], [phone], Mon-Fri 9am-5pm          [facility] [phone] . [place], [address], [place] 8am-5pm, Fri 8am-noon . [place], [address], Mon-Fri 7:30am-4:30pm          [facility] - [phone]          [address], [place] [phone] N. [address]          Only accepts [place] [employer] patients after they have their name applied to their card  Self Pay (no insurance) in [facility]:           Sickle Cell Patients:  . [place], [phone] [place] [facility]: . [address], [place] [phone]       [facility] . [place] [phone]  [facility]: . [address], [phone]          [facility]           [place], [phone] [facility] . [place], (  [phone]           [facility] [place]           [place], [address], [place] [address]           [phone], Mon-Fri 9am-7pm, [place] 9am-1pm          [facility] - Family Medicine @ [place], Susan Moore          [facility] - [facility]           [address] [place] [facility] - [place] . [place] [phone]          [facility]           [address] [place]  [facility] - [place] [address]  . [place], [phone] [facility] - [place] . [address], [phone]  Dr. Vista Lawman           [address], [place], [facility]           [address], [phone]          [facility]             [address]  [phone]          [facility]           [place], [place], 1st & 3rd Saturday every month, 10am-1pm OTHERS:  [facility]  ([facility] Only)  [phone] (Thursday only) Strategies for finding a Primary Care Provider:  1) Find a Doctor and Pay Out of Pocket  Although you won't have to find out who is covered by your insurance plan, it is a good idea to ask around and get recommendations. You will then need to call the office and see if the doctor you have chosen will accept you as a new patient and what types of options they offer for patients who are self-pay. Some doctors offer discounts or will set up payment plans for their patients who do not have insurance, but you will need to ask so you aren't surprised when you get to your appointment.  2) [place] - To see if you qualify for "orange card" access to healthcare safety net providers.  Call for appointment for eligibility/enrollment at [phone] or [phone]. (Uninsured, 0-200% FPL, qualifying info)  Applicants for [facility] are first required to see if they are eligible to enroll in the [facility] Marketplace before enrolling in [facility] (and get an exemption if they are not).  GCCN Criteria for acceptance is:  ? Proof of ACA Marketing exemption - form or documentation  ? Valid photo ID (driver's license, state identification card, passport, home country ID)  ? Proof of [facility] residency (e.g. driver's license, lease/landlord information, pay stubs with address, utility bill, bank statement, etc.)  ? Proof of income (1040, last year's tax return, W2, 4 current pay stubs, other income proof)  ? Proof of assets (current bank statement +  3 most recent, disability paperwork, life insurance info, tax value on autos, etc.)  3) [place] Department  Not all health departments have doctors that can see patients for sick visits, but many do, so it is worth a call  to see if yours does. If you don't know where your local health department is, you can check in your phone book. The CDC also has a tool to help you locate your state's health department, and many state websites also have listings of all of their local health departments.  4) Find a [facility] [place] [facility]  If your illness is not likely to be very severe or complicated, you may want to try a walk in clinic. These are popping up all over the country in pharmacies, drugstores, and shopping centers. They're usually staffed by nurse practitioners or physician assistants that have been trained to treat common illnesses and complaints. They're usually fairly quick and inexpensive. However, if you have serious medical issues or chronic medical problems, these are probably not your best option   STD Testing:           [place], [place] [facility]           [address], [place], phone [phone] or [phone]           Monday - Friday, call for an appointment          [place], [place] [facility]           [address], [place], phone [phone] or [phone]           Monday - Friday, call for an appointment Abuse/Neglect:           [place]: [place]: [phone] (After Hours) Emergency Shelter:  [facility] [phone]  [place]- [phone]  [place] - [phone]  [facility] - [phone] (ages 22-17)  [place] @ [employer] - [phone]   [place] at [place]:           [facility] [place]: [phone]   (Homeless mother with children)          [place]: [phone] (Mothers only) . [facility]: [phone] (Pregnant 75-73 years old) . Adopt a Mom  -[phone]  [facility]  . [facility] - [place] . [address] [place] [phone]          [facility] [address], [place], [place]          [facility].           [place], [place]  [facility]           [phone]          [facility] in [place]           [phone]           [phone] (After Hours)  [place] Abuse Resources:           Alcohol and Drug Services: [phone]           [facility]: [phone]          [facility]: [phone]  . Narcotics Helpline [phone]          [facility]: [phone]           Residential & Outpatient Substance Abuse Program - [facility] [place]: [phone] . [facility] and [place] [phone] Psychological Services:          [place]: [phone]  . Therapeutic Alternatives: [phone]          [place] LINE: [phone]     (24 Hour) . Mobile Crisis:  . HELPLINES:  [employer] on [place] [phone] [facility] on [place] [phone] . Walk In [place]  ([place] - [phone] or [phone]  [place]. [place] [phone]  [place] [address], [place] [phone]   Dental Assistance:  If unable to pay or uninsured, contact: [facility]. to become qualified for the adult dental clinic. Patient must be enrolled in [facility] (uninsured, 0-200% FPL, qualifying info).  Enroll in [facility] first, then see Primary Care Physician assigned to you, the PCP makes a dental referral. [place] Adult [facility] Access Program will receive referral and contacts patient for appointment.  Patients with Medicaid           [address] [place] (Children up to 25 + Pregnant Women) - [phone]  [place] - [address] [phone]  If unable to pay, or uninsured: contact [place] [phone] in [place] - ([place] only + Pregnant Women), [phone] in [place] only) to become qualified for the adult dental clinic  Must see if eligible to enroll in [place] before enrolling into the [facility] (exemption required) [phone] for an appointment)  [email or web address];   [phone].  If not eligible for ACA, then go by Department of Health and Human Services to see if eligible for "orange card."  [address], [place] and [place].  Once you get an orange card, you will have a Primary Care home who will then refer you to dental if needed.  Other Scientist, forensic:   [facility] - [phone] (ext [phone])   [address]  Dr. Donn Pierini - [phone]   [place]   [facility]           [place], [place], [place], [address]           [phone], Ext. [phone]           2nd and 4th Thursday of the month at 6:30am (Simple extractions only - no wisdom teeth or surgery) First come/First serve -First 10 clients served           [facility] [place], [place] and [place] residents only)          [address] Madelaine Bhat [place], [place], [address]           [phone]                    [place] Department           [phone]          [place]         [facility]          [phone]   Transportation Options:  Ambulance - 911 - [phone] per ride Family Member to accompany patient (if stable) - [place] - [phone]  PART - [phone]  Taxi - [phone] - [place] [phone] (Application required)  [facility] - [phone]        Health Maintenance, Female Adopting a healthy lifestyle and getting preventive care can go a long way to promote health and wellness. Talk with your health care provider about what schedule of regular examinations is right for you. This is a good chance for you to check in with your provider about disease prevention and staying healthy. In between checkups, there are plenty of things you can do on your own. Experts have done a lot of research about which lifestyle changes and preventive measures are most likely to keep you healthy. Ask your health care provider for more information. Weight and diet Eat a healthy diet  Be sure to include plenty of vegetables, fruits, low-fat dairy products, and lean protein.  Do not eat a lot of foods high in solid fats, added sugars, or salt.  Get regular exercise. This is one of the most important things you can do for your health. ? Most adults should exercise for at least 150 minutes each week. The exercise should increase your heart rate and make you sweat (moderate-intensity exercise). ? Most adults should also do strengthening exercises at least twice a week. This is in addition to the moderate-intensity exercise. Maintain a healthy weight  Body mass index (BMI) is a measurement that can be used to identify  possible weight problems. It estimates body fat based on height and weight. Your health care provider can help determine your BMI and help you achieve or maintain a healthy weight.  For females 95 years of age and older: ? A BMI below 18.5 is considered underweight. ? A BMI of 18.5 to 24.9 is normal. ? A BMI of 25 to 29.9 is considered overweight. ? A BMI  of 30 and above is considered obese. Watch levels of cholesterol and blood lipids  You should start having your blood tested for lipids and cholesterol at 23 years of age, then have this test every 5 years.  You may need to have your cholesterol levels checked more often if: ? Your lipid or cholesterol levels are high. ? You are older than 23 years of age. ? You are at high risk for heart disease. Cancer screening Lung Cancer  Lung cancer screening is recommended for adults 51-29 years old who are at high risk for lung cancer because of a history of smoking.  A yearly low-dose CT scan of the lungs is recommended for people who: ? Currently smoke. ? Have quit within the past 15 years. ? Have at least a 30-pack-year history of smoking. A pack year is smoking an average of one pack of cigarettes a day for 1 year.  Yearly screening should continue until it has been 15 years since you quit.  Yearly screening should stop if you develop a health problem that would prevent you from having lung cancer treatment. Breast Cancer  Practice breast self-awareness. This means understanding how your breasts normally appear and feel.  It also means doing regular breast self-exams. Let your health care provider know about any changes, no matter how small.  If you are in your 20s or 30s, you should have a clinical breast exam (CBE) by a health care provider every 1-3 years as part of a regular health exam.  If you are 47 or older, have a CBE every year. Also consider having a breast X-ray (mammogram) every year.  If you have a family history of  breast cancer, talk to your health care provider about genetic screening.  If you are at high risk for breast cancer, talk to your health care provider about having an MRI and a mammogram every year.  Breast cancer gene (BRCA) assessment is recommended for women who have family members with BRCA-related cancers. BRCA-related cancers include: ? Breast. ? Ovarian. ? Tubal. ? Peritoneal cancers.  Results of the assessment will determine the need for genetic counseling and BRCA1 and BRCA2 testing. Cervical Cancer Your health care provider may recommend that you be screened regularly for cancer of the pelvic organs (ovaries, uterus, and vagina). This screening involves a pelvic examination, including checking for microscopic changes to the surface of your cervix (Pap test). You may be encouraged to have this screening done every 3 years, beginning at age 68.  For women ages 70-65, health care providers may recommend pelvic exams and Pap testing every 3 years, or they may recommend the Pap and pelvic exam, combined with testing for human papilloma virus (HPV), every 5 years. Some types of HPV increase your risk of cervical cancer. Testing for HPV may also be done on women of any age with unclear Pap test results.  Other health care providers may not recommend any screening for nonpregnant women who are considered low risk for pelvic cancer and who do not have symptoms. Ask your health care provider if a screening pelvic exam is right for you.  If you have had past treatment for cervical cancer or a condition that could lead to cancer, you need Pap tests and screening for cancer for at least 20 years after your treatment. If Pap tests have been discontinued, your risk factors (such as having a new sexual partner) need to be reassessed to determine if screening should resume. Some women have medical  problems that increase the chance of getting cervical cancer. In these cases, your health care provider may  recommend more frequent screening and Pap tests. Colorectal Cancer  This type of cancer can be detected and often prevented.  Routine colorectal cancer screening usually begins at 23 years of age and continues through 23 years of age.  Your health care provider may recommend screening at an earlier age if you have risk factors for colon cancer.  Your health care provider may also recommend using home test kits to check for hidden blood in the stool.  A small camera at the end of a tube can be used to examine your colon directly (sigmoidoscopy or colonoscopy). This is done to check for the earliest forms of colorectal cancer.  Routine screening usually begins at age 50.  Direct examination of the colon should be repeated every 5-10 years through 23 years of age. However, you may need to be screened more often if early forms of precancerous polyps or small growths are found. Skin Cancer  Check your skin from head to toe regularly.  Tell your health care provider about any new moles or changes in moles, especially if there is a change in a mole's shape or color.  Also tell your health care provider if you have a mole that is larger than the size of a pencil eraser.  Always use sunscreen. Apply sunscreen liberally and repeatedly throughout the day.  Protect yourself by wearing long sleeves, pants, a wide-brimmed hat, and sunglasses whenever you are outside. Heart disease, diabetes, and high blood pressure  High blood pressure causes heart disease and increases the risk of stroke. High blood pressure is more likely to develop in: ? People who have blood pressure in the high end of the normal range (130-139/85-89 mm Hg). ? People who are overweight or obese. ? People who are African American.  If you are 42-79 years of age, have your blood pressure checked every 3-5 years. If you are 30 years of age or older, have your blood pressure checked every year. You should have your blood pressure  measured twice-once when you are at a hospital or clinic, and once when you are not at a hospital or clinic. Record the average of the two measurements. To check your blood pressure when you are not at a hospital or clinic, you can use: ? An automated blood pressure machine at a pharmacy. ? A home blood pressure monitor.  If you are between 57 years and 58 years old, ask your health care provider if you should take aspirin to prevent strokes.  Have regular diabetes screenings. This involves taking a blood sample to check your fasting blood sugar level. ? If you are at a normal weight and have a low risk for diabetes, have this test once every three years after 23 years of age. ? If you are overweight and have a high risk for diabetes, consider being tested at a younger age or more often. Preventing infection Hepatitis B  If you have a higher risk for hepatitis B, you should be screened for this virus. You are considered at high risk for hepatitis B if: ? You were born in a country where hepatitis B is common. Ask your health care provider which countries are considered high risk. ? Your parents were born in a high-risk country, and you have not been immunized against hepatitis B (hepatitis B vaccine). ? You have HIV or AIDS. ? You use needles to inject street  drugs. ? You live with someone who has hepatitis B. ? You have had sex with someone who has hepatitis B. ? You get hemodialysis treatment. ? You take certain medicines for conditions, including cancer, organ transplantation, and autoimmune conditions. Hepatitis C  Blood testing is recommended for: ? Everyone born from [birth year] through 1965. ? Anyone with known risk factors for hepatitis C. Sexually transmitted infections (STIs)  You should be screened for sexually transmitted infections (STIs) including gonorrhea and chlamydia if: ? You are sexually active and are younger than 23 years of age. ? You are older than 23 years of age and  your health care provider tells you that you are at risk for this type of infection. ? Your sexual activity has changed since you were last screened and you are at an increased risk for chlamydia or gonorrhea. Ask your health care provider if you are at risk.  If you do not have HIV, but are at risk, it may be recommended that you take a prescription medicine daily to prevent HIV infection. This is called pre-exposure prophylaxis (PrEP). You are considered at risk if: ? You are sexually active and do not regularly use condoms or know the HIV status of your partner(s). ? You take drugs by injection. ? You are sexually active with a partner who has HIV. Talk with your health care provider about whether you are at high risk of being infected with HIV. If you choose to begin PrEP, you should first be tested for HIV. You should then be tested every 3 months for as long as you are taking PrEP. Pregnancy  If you are premenopausal and you may become pregnant, ask your health care provider about preconception counseling.  If you may become pregnant, take 400 to 800 micrograms (mcg) of folic acid every day.  If you want to prevent pregnancy, talk to your health care provider about birth control (contraception). Osteoporosis and menopause  Osteoporosis is a disease in which the bones lose minerals and strength with aging. This can result in serious bone fractures. Your risk for osteoporosis can be identified using a bone density scan.  If you are 62 years of age or older, or if you are at risk for osteoporosis and fractures, ask your health care provider if you should be screened.  Ask your health care provider whether you should take a calcium or vitamin D supplement to lower your risk for osteoporosis.  Menopause may have certain physical symptoms and risks.  Hormone replacement therapy may reduce some of these symptoms and risks. Talk to your health care provider about whether hormone replacement  therapy is right for you. Follow these instructions at home:  Schedule regular health, dental, and eye exams.  Stay current with your immunizations.  Do not use any tobacco products including cigarettes, chewing tobacco, or electronic cigarettes.  If you are pregnant, do not drink alcohol.  If you are breastfeeding, limit how much and how often you drink alcohol.  Limit alcohol intake to no more than 1 drink per day for nonpregnant women. One drink equals 12 ounces of beer, 5 ounces of wine, or 1 ounces of hard liquor.  Do not use street drugs.  Do not share needles.  Ask your health care provider for help if you need support or information about quitting drugs.  Tell your health care provider if you often feel depressed.  Tell your health care provider if you have ever been abused or do not feel safe at  home. This information is not intended to replace advice given to you by your health care provider. Make sure you discuss any questions you have with your health care provider. Document Released: 03/15/2011 Document Revised: 02/05/2016 Document Reviewed: 06/03/2015 Elsevier Interactive Patient Education  2019 [employer].

## 2018-09-19 NOTE — Progress Notes (Signed)
Subjective:     Shelby Serrano is a 23 y.o. female who presents for a postpartum visit. She is 4 weeks postpartum following a spontaneous vaginal delivery. I have fully reviewed the prenatal and intrapartum course. The delivery was at 39/1 gestational weeks. Outcome: spontaneous vaginal delivery. Anesthesia: none. Postpartum course has been normal. Baby's course has been normal. Baby is feeding by bottle Rush Barer Good start. Bleeding no bleeding. Bowel function is normal. Bladder function is normal. Patient is not sexually active. Contraception method is none. Postpartum depression screening: negative.  The following portions of the patient's history were reviewed and updated as appropriate: allergies, current medications, past family history, past medical history, past social history, past surgical history and problem list.  Review of Systems Pertinent items are noted in HPI.   Objective:    BP 113/79   Pulse 71   Ht 5\' 1"  (1.549 m)   Wt 107 lb 4.8 oz (48.7 kg)   Breastfeeding No   BMI 20.27 kg/m   General:  alert, cooperative and appears stated age  Lungs: clear to auscultation bilaterally  Heart:  regular rate and rhythm, S1, S2 normal, no murmur, click, rub or gallop  Abdomen: soft, non-tender; bowel sounds normal; no masses,  no organomegaly   Vulva:  not evaluated  Vagina: not evaluated        Assessment:     Normal postpartum exam. Pap smear not done at today's visit.   Plan:   1. Encounter for routine postpartum follow-up -doing well -pap up to date -encouraged patient to establish care with a PCP -- list of resources given  2. Encounter for initial prescription of contraceptive pills  - norethindrone (MICRONOR,CAMILA,ERRIN) 0.35 MG tablet; Take 1 tablet (0.35 mg total) by mouth daily.  Dispense: 1 Package; Refill: 11   Judeth Horn, NP

## 2018-09-22 NOTE — Telephone Encounter (Signed)
Received denial of refill for flexeril. I called pharmacy and left message making sure they received denial of request.

## 2019-05-07 IMAGING — US US MFM OB COMP +14 WKS
1 series · 14 of 28 positions shown · non-contrast
Comparison: none

[Series 1: us mfm ob comp +14 wks · 14 of 46 slices shown]
[im 2/46]
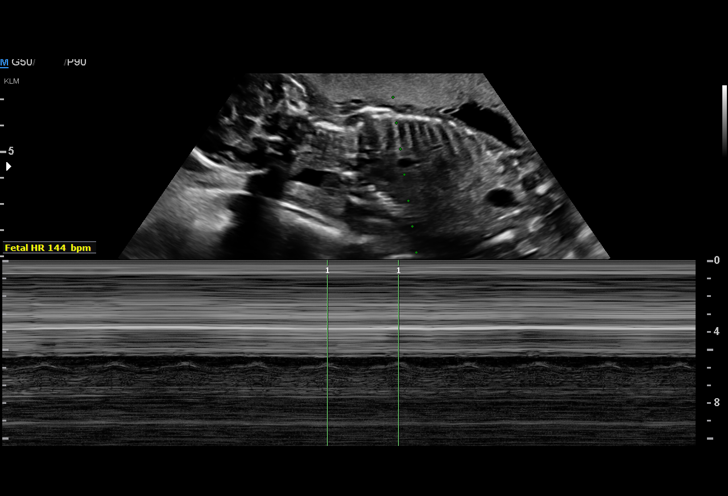
[im 6/46]
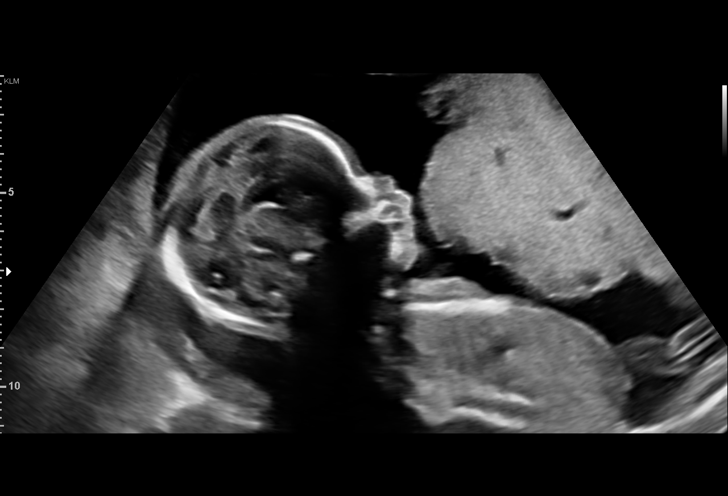
[im 9/46]
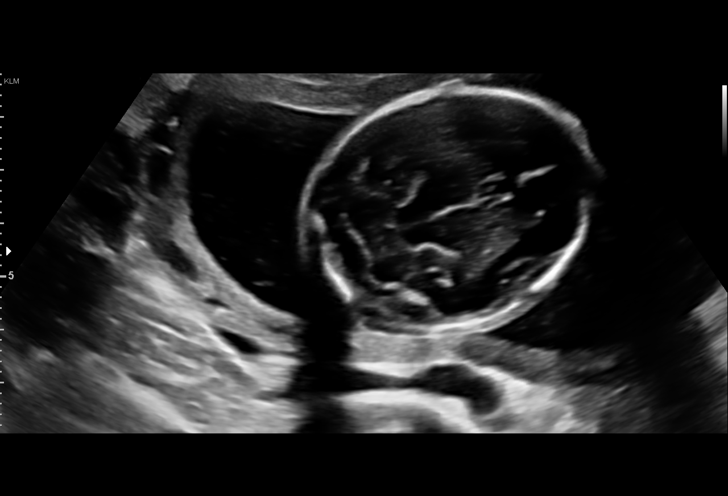
[im 12/46]
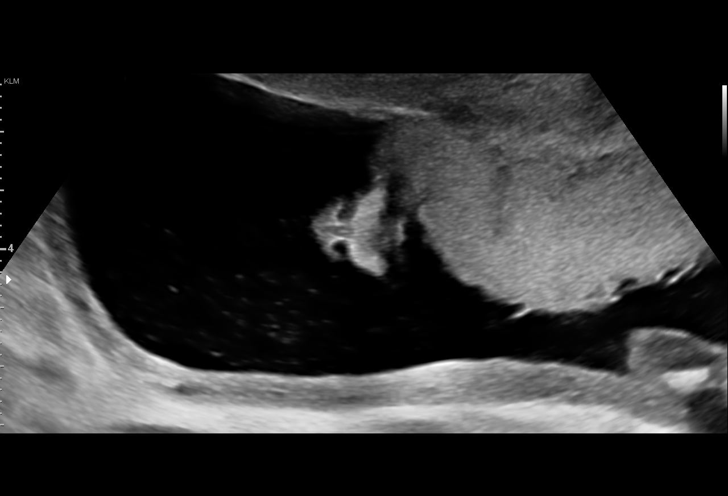
[im 16/46]
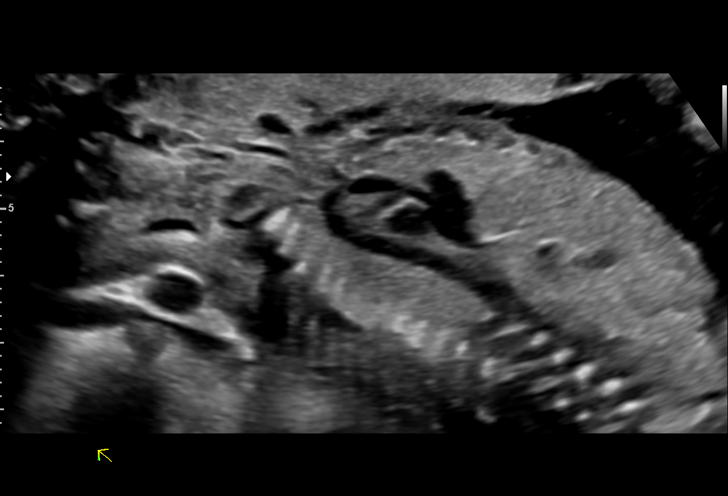
[im 19/46]
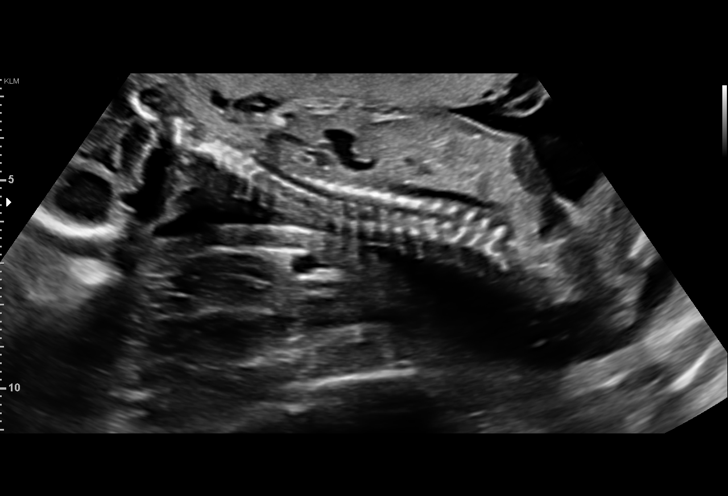
[im 22/46]
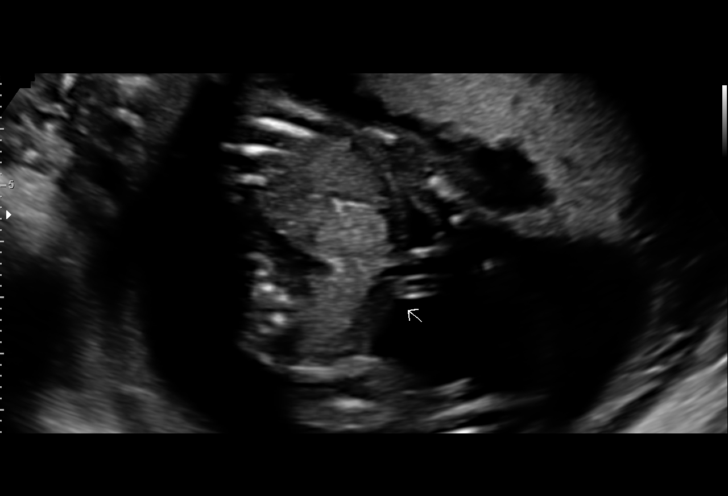
[im 26/46]
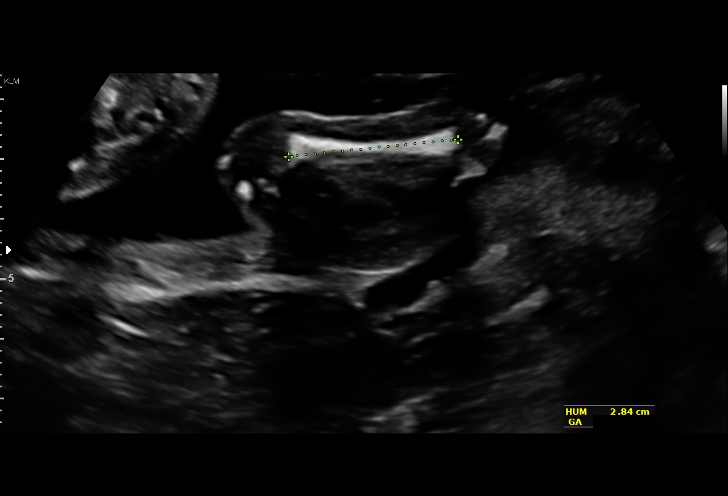
[im 29/46]
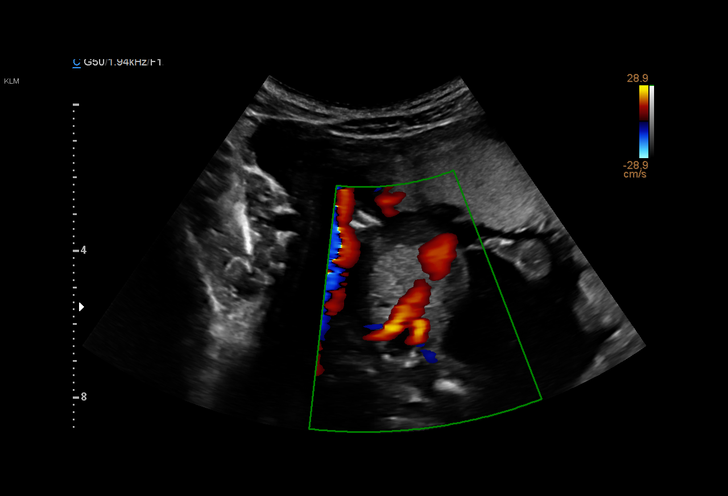
[im 32/46]
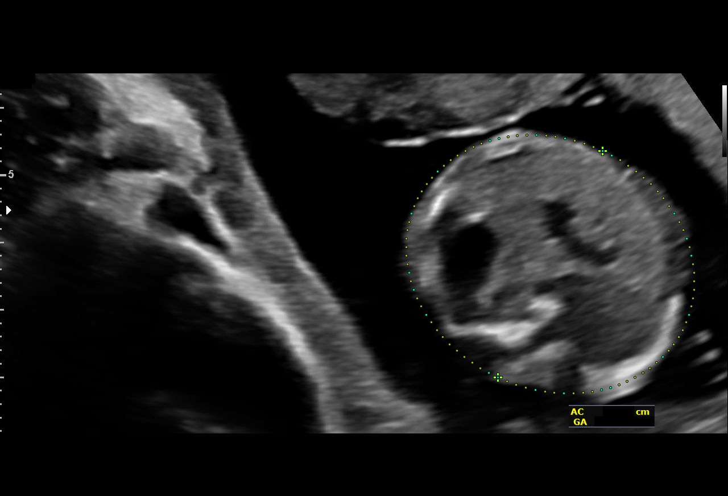
[im 36/46]
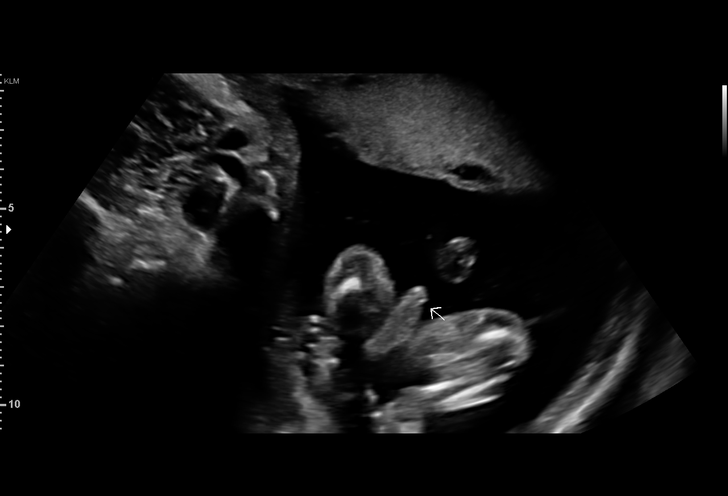
[im 39/46]
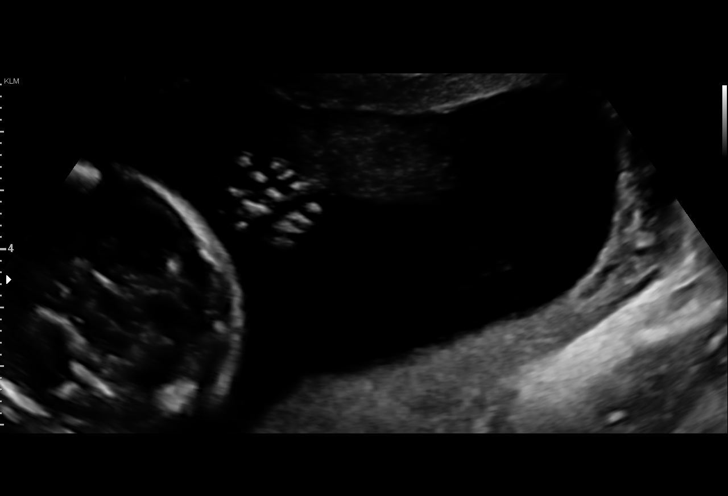
[im 42/46]
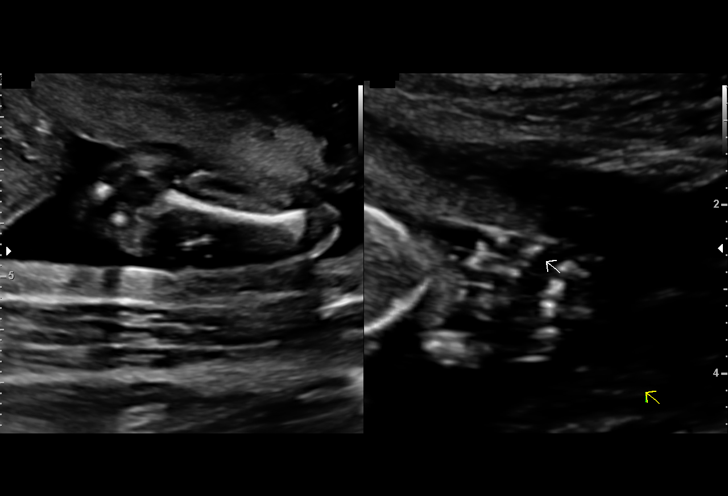
[im 46/46]
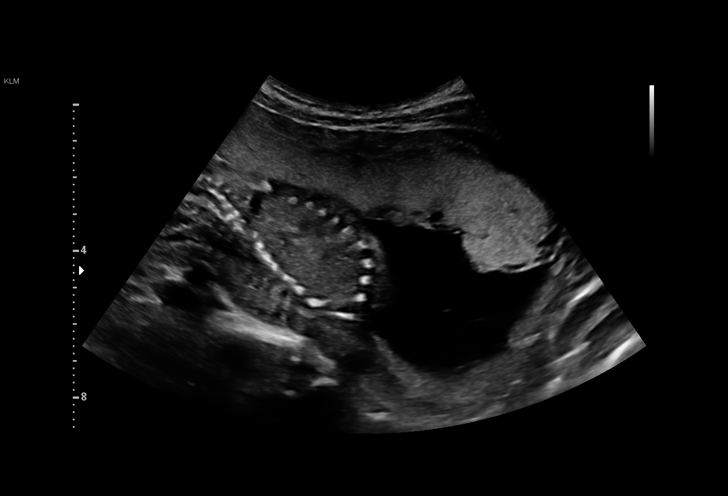

[14 of 28 positions shown; findings below may reference images not displayed]

1  FANTA                 [id number]      [id number]     [id number]
SAPAI
Indications

Weeks of gestation of pregnancy not
specified
Encounter for antenatal screening for
malformations
History of sickle cell trait
OB History

Gravidity:    6         Term:   2        Prem:   0        SAB:   3
TOP:          0       Ectopic:  0        Living: 2
Fetal Evaluation

Num Of Fetuses:     1
Fetal Heart         144
Rate(bpm):
Cardiac Activity:   Observed
Presentation:       Breech
Placenta:           Anterior
P. Cord Insertion:  Not well visualized

Amniotic Fluid
AFI FV:      Subjectively within normal limits
Biometry

BPD:        45  mm     G. Age:  19w 4d                  CI:        74.49   %
FL/HC:      19.1   %
HC:      165.5  mm     G. Age:  19w 2d                  HC/AC:
AC:      129.3  mm     G. Age:  18w 4d                  FL/BPD:     70.2   %
FL:       31.6  mm     G. Age:  19w 6d                  FL/AC:      24.4   %
HUM:      28.6  mm     G. Age:  19w 2d
CER:      19.6  mm     G. Age:  18w 6d

Est. FW:     276  gm    0 lb 10 oz
Gestational Age

LMP:           14w 0d        Date:  12/22/17                 EDD:   09/28/18
U/S Today:     19w 2d                                        EDD:   08/22/18
Anatomy

Cranium:               Appears normal         Aortic Arch:            Appears normal
Cavum:                 Appears normal         Ductal Arch:            Appears normal
Ventricles:            Appears normal         Diaphragm:              Appears normal
Choroid Plexus:        Appears normal         Stomach:                Appears normal, left
sided
Cerebellum:            Appears normal         Abdomen:                Appears normal
Posterior Fossa:       Appears normal         Abdominal Wall:         Appears nml (cord
insert, abd wall)
Nuchal Fold:           Appears normal         Cord Vessels:           Appears normal (3
vessel cord)
Face:                  Appears normal         Kidneys:                Appear normal
(orbits and profile)
Lips:                  Appears normal         Bladder:                Appears normal
Thoracic:              Appears normal         Spine:                  Not well visualized
Heart:                 Appears normal         Upper Extremities:      Appears normal
(4CH, axis, and situs
RVOT:                  Appears normal         Lower Extremities:      Appears normal
LVOT:                  Appears normal

Other:  Fetus appears to be a male. Heels and 5th digit visualized. Nasal
bone visualized.
Cervix Uterus Adnexa

Cervix
Length:            3.4  cm.
Normal appearance by transabdominal scan.
Impression

We performed fetal anatomy scan. No makers of
aneuploidies or fetal structural defects are seen. Fetal
biometry is consistent with her previously-established dates.
Amniotic fluid is normal and good fetal activity is seen.
MSAFP screening showed low risk for open-neural tube
defects.

On cell-free fetal DNA screening, the risks of fetal
aneuploidies are not increased.
Recommendations

An appointment was made for her to return in 4 weeks for
completion of fetal anatomy (fetal spine).

## 2019-09-14 NOTE — L&D Delivery Note (Signed)
OB/[facility]N Faculty Practice Delivery Note  Lisseth Brazeau is a 24 y.o. [id number] s/p VD at [redacted]w[redacted]d. She was admitted for SOL.   ROM: 0h 81m with clear fluid GBS Status: Negative/-- (05/24 1544) Maximum Maternal Temperature: 98.63F  Labor Progress: . Initial SVE: 7/80/0. AROM performed. She then progressed to complete.   Delivery Date/Time: 6/7 @ [phone] Delivery: Called to room and patient was complete and pushing. Head delivered ROA position. No nuchal cord present. Shoulder and body delivered in usual fashion. Infant with spontaneous cry, placed on mother's abdomen, dried and stimulated. Cord clamped x 2 after 1-minute delay, and cut by FOB. Cord blood drawn. Placenta delivered spontaneously with gentle cord traction. Fundus firm with massage and Pitocin. Labia, perineum, vagina, and cervix inspected inspected with no lacerations.  Baby Weight: pending  Placenta: Sent to L&D Complications: None Lacerations: None EBL: 390 mL Analgesia: IV Fentanyl    Infant:  APGAR (1 MIN): 9   APGAR (5 MINS): 9   APGAR (10 MINS):     Jerilynn Birkenhead, MD [facility] Family Medicine Fellow, [facility] for [facility], [facility] 02/18/2020, 8:31 AM

## 2019-10-01 ENCOUNTER — Ambulatory Visit: Payer: Medicaid Other | Attending: Internal Medicine

## 2019-10-01 DIAGNOSIS — Z20822 Contact with and (suspected) exposure to covid-19: Secondary | ICD-10-CM

## 2019-10-02 LAB — NOVEL CORONAVIRUS, NAA: SARS-CoV-2, NAA: NOT DETECTED

## 2019-12-19 ENCOUNTER — Encounter (HOSPITAL_COMMUNITY): Payer: Self-pay | Admitting: Obstetrics and Gynecology

## 2019-12-19 ENCOUNTER — Inpatient Hospital Stay (HOSPITAL_COMMUNITY)
Admission: AD | Admit: 2019-12-19 | Discharge: 2019-12-19 | Disposition: A | Discharge status: Home / Self Care | Payer: Medicaid Other | Attending: Obstetrics and Gynecology | Admitting: Obstetrics and Gynecology

## 2019-12-19 ENCOUNTER — Other Ambulatory Visit: Payer: Self-pay

## 2019-12-19 ENCOUNTER — Inpatient Hospital Stay (HOSPITAL_BASED_OUTPATIENT_CLINIC_OR_DEPARTMENT_OTHER): Payer: Medicaid Other

## 2019-12-19 DIAGNOSIS — O0933 Supervision of pregnancy with insufficient antenatal care, third trimester: Secondary | ICD-10-CM | POA: Diagnosis not present

## 2019-12-19 DIAGNOSIS — O99013 Anemia complicating pregnancy, third trimester: Secondary | ICD-10-CM | POA: Insufficient documentation

## 2019-12-19 DIAGNOSIS — R102 Pelvic and perineal pain: Secondary | ICD-10-CM | POA: Insufficient documentation

## 2019-12-19 DIAGNOSIS — B9689 Other specified bacterial agents as the cause of diseases classified elsewhere: Secondary | ICD-10-CM | POA: Insufficient documentation

## 2019-12-19 DIAGNOSIS — O26893 Other specified pregnancy related conditions, third trimester: Secondary | ICD-10-CM | POA: Diagnosis not present

## 2019-12-19 DIAGNOSIS — Z87891 Personal history of nicotine dependence: Secondary | ICD-10-CM | POA: Diagnosis not present

## 2019-12-19 DIAGNOSIS — M545 Low back pain: Secondary | ICD-10-CM | POA: Diagnosis not present

## 2019-12-19 DIAGNOSIS — D573 Sickle-cell trait: Secondary | ICD-10-CM | POA: Diagnosis not present

## 2019-12-19 DIAGNOSIS — Z3A29 29 weeks gestation of pregnancy: Secondary | ICD-10-CM | POA: Diagnosis not present

## 2019-12-19 DIAGNOSIS — O23593 Infection of other part of genital tract in pregnancy, third trimester: Secondary | ICD-10-CM | POA: Insufficient documentation

## 2019-12-19 DIAGNOSIS — Z3689 Encounter for other specified antenatal screening: Secondary | ICD-10-CM

## 2019-12-19 LAB — CBC
HCT: 33.6 % — ABNORMAL LOW (ref 36.0–46.0)
Hemoglobin: 11.4 g/dL — ABNORMAL LOW (ref 12.0–15.0)
MCH: 29.1 pg (ref 26.0–34.0)
MCHC: 33.9 g/dL (ref 30.0–36.0)
MCV: 85.7 fL (ref 80.0–100.0)
Platelets: 148 10*3/uL — ABNORMAL LOW (ref 150–400)
RBC: 3.92 MIL/uL (ref 3.87–5.11)
RDW: 14 % (ref 11.5–15.5)
WBC: 12.4 10*3/uL — ABNORMAL HIGH (ref 4.0–10.5)
nRBC: 0 % (ref 0.0–0.2)

## 2019-12-19 LAB — COMPREHENSIVE METABOLIC PANEL
ALT: 17 U/L (ref 0–44)
AST: 16 U/L (ref 15–41)
Albumin: 2.5 g/dL — ABNORMAL LOW (ref 3.5–5.0)
Alkaline Phosphatase: 130 U/L — ABNORMAL HIGH (ref 38–126)
Anion gap: 10 (ref 5–15)
BUN: 6 mg/dL (ref 6–20)
CO2: 20 mmol/L — ABNORMAL LOW (ref 22–32)
Calcium: 8.9 mg/dL (ref 8.9–10.3)
Chloride: 106 mmol/L (ref 98–111)
Creatinine, Ser: 0.72 mg/dL (ref 0.44–1.00)
GFR calc Af Amer: >60 mL/min (ref >60)
GFR calc non Af Amer: >60 mL/min (ref >60)
Glucose, Bld: 73 mg/dL (ref 70–99)
Potassium: 3.7 mmol/L (ref 3.5–5.1)
Sodium: 136 mmol/L (ref 135–145)
Total Bilirubin: 0.7 mg/dL (ref 0.3–1.2)
Total Protein: 5.7 g/dL — ABNORMAL LOW (ref 6.5–8.1)

## 2019-12-19 LAB — WET PREP, GENITAL
Sperm: NONE SEEN
Trich, Wet Prep: NONE SEEN
Yeast Wet Prep HPF POC: NONE SEEN

## 2019-12-19 LAB — HIV ANTIBODY (ROUTINE TESTING W REFLEX): HIV Screen 4th Generation wRfx: NONREACTIVE

## 2019-12-19 LAB — HEPATITIS C ANTIBODY: HCV Ab: NONREACTIVE

## 2019-12-19 LAB — HEPATITIS B SURFACE ANTIGEN: Hepatitis B Surface Ag: NONREACTIVE

## 2019-12-19 MED ORDER — LACTATED RINGERS IV BOLUS
1000.0000 mL | Freq: Once | INTRAVENOUS | Status: AC
  Administered 2019-12-19: 17:00:00 1000 mL via INTRAVENOUS

## 2019-12-19 MED ORDER — ACETAMINOPHEN 500 MG PO TABS
1000.0000 mg | ORAL_TABLET | Freq: Once | ORAL | Status: AC
  Administered 2019-12-19: 1000 mg via ORAL
  Filled 2019-12-19: qty 2

## 2019-12-19 MED ORDER — CYCLOBENZAPRINE HCL 10 MG PO TABS: 10.0000 mg | ORAL_TABLET | Freq: Two times a day (BID) | ORAL | 0 refills | Status: DC | PRN

## 2019-12-19 MED ORDER — PRENATAL VITAMINS 0.8 MG PO TABS: 1.0000 | ORAL_TABLET | Freq: Every day | ORAL | 12 refills | Status: DC

## 2019-12-19 MED ORDER — METRONIDAZOLE 500 MG PO TABS: 500.0000 mg | ORAL_TABLET | Freq: Two times a day (BID) | ORAL | 0 refills | Status: DC

## 2019-12-19 MED ORDER — CYCLOBENZAPRINE HCL 5 MG PO TABS
10.0000 mg | ORAL_TABLET | Freq: Once | ORAL | Status: AC
  Administered 2019-12-19: 10 mg via ORAL
  Filled 2019-12-19: qty 2

## 2019-12-19 MED ORDER — ACETAMINOPHEN 325 MG PO TABS: 650.0000 mg | ORAL_TABLET | ORAL | 2 refills | Status: DC | PRN

## 2019-12-19 NOTE — MAU Provider Note (Signed)
History     CSN: [id number]  Arrival date and time: 12/19/19 1620   First Provider Initiated Contact with Patient 12/19/19 1639      Chief Complaint  Patient presents with  . Contractions   HPI Shelby Serrano is a 24 y.o. [phone] at [redacted]w[redacted]d who presents to MAU with chief complaint of lower abdominal and lower back pain. These are new problems, onset this morning around 0300. She rates her pain as 5/10. She denies aggravating or alleviating factors. She has not taken medication or tried other treatments for this complaint.  She denies vaginal bleeding, leaking of fluid, decreased fetal movement, fever, falls, or recent illness.   Patient has not initiated prenatal care for this pregnancy. She states she was nervous about contracting COVID   OB History    Gravida  7   Para  3   Term  3   Preterm  0   AB  3   Living  3     SAB  3   TAB  0   Ectopic  0   Multiple  0   Live Births  3           Past Medical History:  Diagnosis Date  . Sickle cell trait (HCC)     History reviewed. No pertinent surgical history.  Family History  Problem Relation Age of Onset  . Diabetes Mother   . Hypertension Mother     Social History   Tobacco Use  . Smoking status: Former Smoker    Packs/day: 1.00    Types: Cigarettes    Quit date: 02/08/2016    Years since quitting: 3.8  . Smokeless tobacco: Never Used  Substance Use Topics  . Alcohol use: No  . Drug use: Not Currently    Types: Marijuana    Comment: Patient denies, Positive UDS 05/04/16 [facility] ED    Allergies: No Known Allergies  Medications Prior to Admission  Medication Sig Dispense Refill Last Dose  . acetaminophen (TYLENOL) 325 MG tablet Take 650 mg by mouth every 6 (six) hours as needed for mild pain or headache.      . ibuprofen (ADVIL,MOTRIN) 600 MG tablet Take 1 tablet (600 mg total) by mouth every 6 (six) hours. 30 tablet 0   . norethindrone (MICRONOR,CAMILA,ERRIN) 0.35 MG tablet Take 1 tablet  (0.35 mg total) by mouth daily. 1 Package 11   . Prenatal Multivit-Min-Fe-FA (PRENATAL VITAMINS) 0.8 MG tablet Take 1 tablet by mouth daily. (Patient not taking: Reported on 09/19/2018) 30 tablet 12   . Prenatal Multivit-Min-Fe-FA (PRENATAL VITAMINS) 0.8 MG tablet Take 1 tablet by mouth daily. (Patient not taking: Reported on 09/19/2018) 30 tablet 12     Review of Systems  Constitutional: Negative for fatigue and fever.  Respiratory: Negative for shortness of breath.   Gastrointestinal: Positive for abdominal pain.  Genitourinary: Negative for vaginal bleeding, vaginal discharge and vaginal pain.  Musculoskeletal: Positive for back pain.  All other systems reviewed and are negative.  Physical Exam   Blood pressure 107/63, pulse 89, temperature 98.4 F (36.9 C), resp. rate 12, last menstrual period 04/17/2019, SpO2 100 %, not currently breastfeeding.  Physical Exam  Nursing note and vitals reviewed. Constitutional: She is oriented to person, place, and time. She appears well-developed and well-nourished.  Cardiovascular: Normal rate, normal heart sounds and intact distal pulses.  Respiratory: Effort normal and breath sounds normal.  GI: Soft. She exhibits no distension. There is no abdominal tenderness. There is no rebound,  no guarding and no CVA tenderness.  Gravid  Genitourinary:    Vagina and uterus normal.     No vaginal discharge.     Genitourinary Comments: Pelvic exam: External genitalia normal, vaginal walls pink and well rugated, cervix visually closed, no lesions noted. Thin white discharge visible at introitus and throughout vault on speculum exam. Bimanual: cervix 0/thick/posterior, neg CMT, uterus nontender, no adnexal tenderness noted.    Neurological: She is alert and oriented to person, place, and time.  Skin: Skin is warm and dry.  Psychiatric: She has a normal mood and affect. Her behavior is normal. Judgment and thought content normal.    MAU Course  Procedures    --OB hx term SVD x 3 with same partner --Fundal height 30cm, c/w measurements in MFM OB formal imaging. EDC updated --Reactive tracing: baseline 140, mod variability, positive accels, no decels --Toco, UI, rare contractions --Patient sleeping one hour after Flexeril and Tylenol  Patient Vitals for the past 24 hrs:  BP Temp Pulse Resp SpO2  12/19/19 1637 107/63 98.4 F (36.9 C) 89 12 100 %   Results for orders placed or performed during the hospital encounter of 12/19/19 (from the past 24 hour(s))  Wet prep, genital     Status: Abnormal   Collection Time: 12/19/19  4:51 PM  Result Value Ref Range   Yeast Wet Prep HPF POC NONE SEEN NONE SEEN   Trich, Wet Prep NONE SEEN NONE SEEN   Clue Cells Wet Prep HPF POC PRESENT (A) NONE SEEN   WBC, Wet Prep HPF POC MANY (A) NONE SEEN   Sperm NONE SEEN   CBC     Status: Abnormal   Collection Time: 12/19/19  5:09 PM  Result Value Ref Range   WBC 12.4 (H) 4.0 - 10.5 K/uL   RBC 3.92 3.87 - 5.11 MIL/uL   Hemoglobin 11.4 (L) 12.0 - 15.0 g/dL   HCT 33.6 (L) 36.0 - 46.0 %   MCV 85.7 80.0 - 100.0 fL   MCH 29.1 26.0 - 34.0 pg   MCHC 33.9 30.0 - 36.0 g/dL   RDW 14.0 11.5 - 15.5 %   Platelets 148 (L) 150 - 400 K/uL   nRBC 0.0 0.0 - 0.2 %  Comprehensive metabolic panel     Status: Abnormal   Collection Time: 12/19/19  5:09 PM  Result Value Ref Range   Sodium 136 135 - 145 mmol/L   Potassium 3.7 3.5 - 5.1 mmol/L   Chloride 106 98 - 111 mmol/L   CO2 20 (L) 22 - 32 mmol/L   Glucose, Bld 73 70 - 99 mg/dL   BUN 6 6 - 20 mg/dL   Creatinine, Ser 0.72 0.44 - 1.00 mg/dL   Calcium 8.9 8.9 - 10.3 mg/dL   Total Protein 5.7 (L) 6.5 - 8.1 g/dL   Albumin 2.5 (L) 3.5 - 5.0 g/dL   AST 16 15 - 41 U/L   ALT 17 0 - 44 U/L   Alkaline Phosphatase 130 (H) 38 - 126 U/L   Total Bilirubin 0.7 0.3 - 1.2 mg/dL   GFR calc non Af Amer >60 >60 mL/min   GFR calc Af Amer >60 >60 mL/min   Anion gap 10 5 - 15  HIV Antibody (routine testing w rflx)     Status: None    Collection Time: 12/19/19  5:09 PM  Result Value Ref Range   HIV Screen 4th Generation wRfx NON REACTIVE NON REACTIVE  Hepatitis B surface antigen  Status: None   Collection Time: 12/19/19  5:09 PM  Result Value Ref Range   Hepatitis B Surface Ag NON REACTIVE NON REACTIVE  Hepatitis C antibody     Status: None   Collection Time: 12/19/19  5:09 PM  Result Value Ref Range   HCV Ab NON REACTIVE NON REACTIVE   Meds ordered this encounter  Medications  . lactated ringers bolus 1,000 mL  . acetaminophen (TYLENOL) tablet 1,000 mg  . cyclobenzaprine (FLEXERIL) tablet 10 mg  . metroNIDAZOLE (FLAGYL) 500 MG tablet    Sig: Take 1 tablet (500 mg total) by mouth 2 (two) times daily.    Dispense:  14 tablet    Refill:  0    Order Specific Question:   Supervising Provider    Answer:   Reva Bores [[id number]]  . Prenatal Multivit-Min-Fe-FA (PRENATAL VITAMINS) 0.8 MG tablet    Sig: Take 1 tablet by mouth daily.    Dispense:  30 tablet    Refill:  12    Order Specific Question:   Supervising Provider    Answer:   Reva Bores [[id number]]  . cyclobenzaprine (FLEXERIL) 10 MG tablet    Sig: Take 1 tablet (10 mg total) by mouth 2 (two) times daily as needed for muscle spasms.    Dispense:  20 tablet    Refill:  0    Order Specific Question:   Supervising Provider    Answer:   Samara Snide   Assessment and Plan  --24 y.o. [phone] at [redacted]w[redacted]d  --Reactive tracing --Bacterial Vaginosis --Round ligament pain --Discharge home in stable condition  F/U: --Patient agreeable to initiating care with [facility].   Calvert Cantor, CNM 12/19/2019, 8:26 PM

## 2019-12-19 NOTE — MAU Note (Signed)
Pt states that she has been having constant pain in her abdomen and back  since 0300.   Denies vaginal bleeding or LOF.   Reports +FM

## 2019-12-20 ENCOUNTER — Encounter: Payer: Self-pay | Admitting: Medical

## 2019-12-20 ENCOUNTER — Ambulatory Visit (INDEPENDENT_AMBULATORY_CARE_PROVIDER_SITE_OTHER): Payer: Self-pay | Admitting: Medical

## 2019-12-20 ENCOUNTER — Encounter: Payer: Self-pay | Admitting: *Deleted

## 2019-12-20 VITALS — BP 95/57 | HR 83 | Wt 117.6 lb

## 2019-12-20 DIAGNOSIS — Z0489 Encounter for examination and observation for other specified reasons: Secondary | ICD-10-CM

## 2019-12-20 DIAGNOSIS — R109 Unspecified abdominal pain: Secondary | ICD-10-CM

## 2019-12-20 DIAGNOSIS — Z3687 Encounter for antenatal screening for uncertain dates: Secondary | ICD-10-CM

## 2019-12-20 DIAGNOSIS — O99891 Other specified diseases and conditions complicating pregnancy: Secondary | ICD-10-CM

## 2019-12-20 DIAGNOSIS — Z3A29 29 weeks gestation of pregnancy: Secondary | ICD-10-CM

## 2019-12-20 DIAGNOSIS — O0933 Supervision of pregnancy with insufficient antenatal care, third trimester: Secondary | ICD-10-CM

## 2019-12-20 DIAGNOSIS — Z363 Encounter for antenatal screening for malformations: Secondary | ICD-10-CM

## 2019-12-20 DIAGNOSIS — O0990 Supervision of high risk pregnancy, unspecified, unspecified trimester: Secondary | ICD-10-CM

## 2019-12-20 DIAGNOSIS — IMO0002 Reserved for concepts with insufficient information to code with codable children: Secondary | ICD-10-CM

## 2019-12-20 DIAGNOSIS — Z23 Encounter for immunization: Secondary | ICD-10-CM

## 2019-12-20 LAB — POCT URINALYSIS DIP (DEVICE)
Bilirubin Urine: NEGATIVE
Glucose, UA: NEGATIVE mg/dL
Hgb urine dipstick: NEGATIVE
Ketones, ur: NEGATIVE mg/dL
Nitrite: NEGATIVE
Protein, ur: NEGATIVE mg/dL
Specific Gravity, Urine: 1.02 (ref 1.005–1.030)
Urobilinogen, UA: 0.2 mg/dL (ref 0.0–1.0)
pH: 7 (ref 5.0–8.0)

## 2019-12-20 LAB — GC/CHLAMYDIA PROBE AMP (~~LOC~~) NOT AT ARMC
Chlamydia: NEGATIVE
Comment: NEGATIVE
Comment: NORMAL
Neisseria Gonorrhea: NEGATIVE

## 2019-12-20 LAB — RPR: RPR Ser Ql: NONREACTIVE

## 2019-12-20 MED ORDER — BLOOD PRESSURE KIT DEVI: 1.0000 | Freq: Once | 0 refills | Status: AC

## 2019-12-20 NOTE — Patient Instructions (Signed)
Fetal Movement Counts Patient Name: ________________________________________________ Patient Due Date: ____________________ What is a fetal movement count?  A fetal movement count is the number of times that you feel your baby move during a certain amount of time. This may also be called a fetal kick count. A fetal movement count is recommended for every pregnant woman. You may be asked to start counting fetal movements as early as week 28 of your pregnancy. Pay attention to when your baby is most active. You may notice your baby's sleep and wake cycles. You may also notice things that make your baby move more. You should do a fetal movement count:  When your baby is normally most active.  At the same time each day. A good time to count movements is while you are resting, after having something to eat and drink. How do I count fetal movements? 1. Find a quiet, comfortable area. Sit, or lie down on your side. 2. Write down the date, the start time and stop time, and the number of movements that you felt between those two times. Take this information with you to your health care visits. 3. Write down your start time when you feel the first movement. 4. Count kicks, flutters, swishes, rolls, and jabs. You should feel at least 10 movements. 5. You may stop counting after you have felt 10 movements, or if you have been counting for 2 hours. Write down the stop time. 6. If you do not feel 10 movements in 2 hours, contact your health care provider for further instructions. Your health care provider may want to do additional tests to assess your baby's well-being. Contact a health care provider if:  You feel fewer than 10 movements in 2 hours.  Your baby is not moving like he or she usually does. Date: ____________ Start time: ____________ Stop time: ____________ Movements: ____________ Date: ____________ Start time: ____________ Stop time: ____________ Movements: ____________ Date: ____________  Start time: ____________ Stop time: ____________ Movements: ____________ Date: ____________ Start time: ____________ Stop time: ____________ Movements: ____________ Date: ____________ Start time: ____________ Stop time: ____________ Movements: ____________ Date: ____________ Start time: ____________ Stop time: ____________ Movements: ____________ Date: ____________ Start time: ____________ Stop time: ____________ Movements: ____________ Date: ____________ Start time: ____________ Stop time: ____________ Movements: ____________ Date: ____________ Start time: ____________ Stop time: ____________ Movements: ____________ This information is not intended to replace advice given to you by your health care provider. Make sure you discuss any questions you have with your health care provider. Document Revised: 04/19/2019 Document Reviewed: 04/19/2019 Elsevier Patient Education  2020 [employer] Braxton Hicks Contractions Contractions of the uterus can occur throughout pregnancy, but they are not always a sign that you are in labor. You may have practice contractions called Braxton Hicks contractions. These false labor contractions are sometimes confused with true labor. What are Braxton Hicks contractions? Braxton Hicks contractions are tightening movements that occur in the muscles of the uterus before labor. Unlike true labor contractions, these contractions do not result in opening (dilation) and thinning of the cervix. Toward the end of pregnancy (32-34 weeks), Braxton Hicks contractions can happen more often and may become stronger. These contractions are sometimes difficult to tell apart from true labor because they can be very uncomfortable. You should not feel embarrassed if you go to the hospital with false labor. Sometimes, the only way to tell if you are in true labor is for your health care provider to look for changes in the cervix. The health care provider   will do a physical exam and may  monitor your contractions. If you are not in true labor, the exam should show that your cervix is not dilating and your water has not broken. If there are no other health problems associated with your pregnancy, it is completely safe for you to be sent home with false labor. You may continue to have Braxton Hicks contractions until you go into true labor. How to tell the difference between true labor and false labor True labor  Contractions last 30-70 seconds.  Contractions become very regular.  Discomfort is usually felt in the top of the uterus, and it spreads to the lower abdomen and low back.  Contractions do not go away with walking.  Contractions usually become more intense and increase in frequency.  The cervix dilates and gets thinner. False labor  Contractions are usually shorter and not as strong as true labor contractions.  Contractions are usually irregular.  Contractions are often felt in the front of the lower abdomen and in the groin.  Contractions may go away when you walk around or change positions while lying down.  Contractions get weaker and are shorter-lasting as time goes on.  The cervix usually does not dilate or become thin. Follow these instructions at home:   Take over-the-counter and prescription medicines only as told by your health care provider.  Keep up with your usual exercises and follow other instructions from your health care provider.  Eat and drink lightly if you think you are going into labor.  If Braxton Hicks contractions are making you uncomfortable: ? Change your position from lying down or resting to walking, or change from walking to resting. ? Sit and rest in a tub of warm water. ? Drink enough fluid to keep your urine pale yellow. Dehydration may cause these contractions. ? Do slow and deep breathing several times an hour.  Keep all follow-up prenatal visits as told by your health care provider. This is important. Contact a  health care provider if:  You have a fever.  You have continuous pain in your abdomen. Get help right away if:  Your contractions become stronger, more regular, and closer together.  You have fluid leaking or gushing from your vagina.  You pass blood-tinged mucus (bloody show).  You have bleeding from your vagina.  You have low back pain that you never had before.  You feel your baby's head pushing down and causing pelvic pressure.  Your baby is not moving inside you as much as it used to. Summary  Contractions that occur before labor are called Braxton Hicks contractions, false labor, or practice contractions.  Braxton Hicks contractions are usually shorter, weaker, farther apart, and less regular than true labor contractions. True labor contractions usually become progressively stronger and regular, and they become more frequent.  Manage discomfort from Braxton Hicks contractions by changing position, resting in a warm bath, drinking plenty of water, or practicing deep breathing. This information is not intended to replace advice given to you by your health care provider. Make sure you discuss any questions you have with your health care provider. Document Revised: 08/12/2017 Document Reviewed: 01/13/2017 Elsevier Patient Education  2020 [employer]  

## 2019-12-21 ENCOUNTER — Encounter: Payer: Self-pay | Admitting: Medical

## 2019-12-21 LAB — OBSTETRIC PANEL, INCLUDING HIV
Antibody Screen: NEGATIVE
Basophils Absolute: 0 10*3/uL (ref 0.0–0.2)
Basos: 0 %
EOS (ABSOLUTE): 0.1 10*3/uL (ref 0.0–0.4)
Eos: 1 %
HIV Screen 4th Generation wRfx: NONREACTIVE
Hematocrit: 30.8 % — ABNORMAL LOW (ref 34.0–46.6)
Hemoglobin: 10.5 g/dL — ABNORMAL LOW (ref 11.1–15.9)
Hepatitis B Surface Ag: NEGATIVE
Immature Grans (Abs): 0.2 10*3/uL — ABNORMAL HIGH (ref 0.0–0.1)
Immature Granulocytes: 2 %
Lymphocytes Absolute: 1.6 10*3/uL (ref 0.7–3.1)
Lymphs: 14 %
MCH: 28.8 pg (ref 26.6–33.0)
MCHC: 34.1 g/dL (ref 31.5–35.7)
MCV: 84 fL (ref 79–97)
Monocytes Absolute: 0.8 10*3/uL (ref 0.1–0.9)
Monocytes: 7 %
Neutrophils Absolute: 8.4 10*3/uL — ABNORMAL HIGH (ref 1.4–7.0)
Neutrophils: 76 %
Platelets: 133 10*3/uL — ABNORMAL LOW (ref 150–450)
RBC: 3.65 x10E6/uL — ABNORMAL LOW (ref 3.77–5.28)
RDW: 13.1 % (ref 11.7–15.4)
RPR Ser Ql: NONREACTIVE
Rh Factor: POSITIVE
Rubella Antibodies, IGG: 1.9 index (ref >0.99)
WBC: 11.2 10*3/uL — ABNORMAL HIGH (ref 3.4–10.8)

## 2019-12-21 NOTE — Progress Notes (Signed)
   PRENATAL VISIT NOTE  Subjective:  Shelby Serrano is a 24 y.o. [phone] at 43w4dbeing seen today for her first prenatal visit for this pregnancy.  She is currently monitored for the following issues for this high-risk pregnancy and has Sickle cell trait ([place]; Sciatic nerve pain; Thrush, oral; No prenatal care in current pregnancy in third trimester; and Supervision of high risk pregnancy, antepartum on their problem list.  Patient reports occasional contractions.  Contractions: Irritability. Vag. Bleeding: None.  Movement: Present. Denies leaking of fluid.   She is planning to bottle feed. Desires BTL for contraception.   The following portions of the patient's history were reviewed and updated as appropriate: allergies, current medications, past family history, past medical history, past social history, past surgical history and problem list.   Objective:   Vitals:   12/20/19 1616  BP: (!) 95/57  Pulse: 83  Weight: 117 lb 9.6 oz (53.3 kg)    Fetal Status: Fetal Heart Rate (bpm): 150   Movement: Present     General:  Alert, oriented and cooperative. Patient is in no acute distress.  Skin: Skin is warm and dry. No rash noted.   Cardiovascular: Normal heart rate and rhythm noted  Respiratory: Normal respiratory effort, no problems with respiration noted. Clear to auscultation.   Abdomen: Soft, gravid, appropriate for gestational age. Normal bowel sounds. Non-tender. Pain/Pressure: Present     Pelvic: Cervical exam deferred        Normal cervical contour, no lesions, no bleeding following pap, normal discharge  Extremities: Normal range of motion.  Edema: None  Mental Status: Normal mood and affect. Normal behavior. Normal judgment and thought content.   Assessment and Plan:  Pregnancy: [id number] 257w4d. Late prenatal care affecting pregnancy in third trimester - Obstetric Panel, Including HIV - Urine Culture - Tdap vaccine greater than or equal to 7yo IM  2.  Supervision of high risk pregnancy, antepartum - CHL AMB BABYSCRIPTS SCHEDULE OPTIMIZATION - Genetic Screening - Panorama only - Blood Pressure Monitoring (BLOOD PRESSURE KIT) DEVI; 1 Device by Does not apply route once for 1 dose. Dx: O09.90  Dispense: 1 each; Refill: 0 - Normal, but incomplete anatomy [place] - [facility] - Normal Pap smear 10/13/2017  3. Evaluate anatomy not seen on prior sonogram - [place]FM OB FOLLOW UP  Preterm labor warning symptoms and general obstetric precautions including but not limited to vaginal bleeding, contractions, leaking of fluid and fetal movement were reviewed in detail with the patient. Please refer to After Visit Summary for other counseling recommendations.   Discussed the normal visit cadence for prenatal care Discussed the nature of our practice with multiple providers including residents and students   Return in about 2 weeks (around 01/03/2020) for LOB, Virtual.  Future Appointments  Date Time Provider De[place]4/14/2021  8:20 AM [facility] LAB [facility] [place]4/21/2021  2:55 PM [place], HaDan EuropeDO WOC-WOCA WOC    JuKerry HoughPA-C

## 2019-12-22 LAB — URINE CULTURE

## 2019-12-24 ENCOUNTER — Encounter: Payer: Self-pay | Admitting: *Deleted

## 2019-12-26 ENCOUNTER — Other Ambulatory Visit: Payer: Self-pay | Admitting: General Practice

## 2019-12-26 ENCOUNTER — Encounter: Payer: Self-pay | Admitting: *Deleted

## 2019-12-26 ENCOUNTER — Other Ambulatory Visit: Payer: Medicaid Other

## 2019-12-26 ENCOUNTER — Other Ambulatory Visit: Payer: Self-pay

## 2019-12-26 DIAGNOSIS — O0990 Supervision of high risk pregnancy, unspecified, unspecified trimester: Secondary | ICD-10-CM

## 2019-12-27 LAB — GLUCOSE TOLERANCE, 2 HOURS W/ 1HR
Glucose, 1 hour: 100 mg/dL (ref 65–179)
Glucose, 2 hour: 109 mg/dL (ref 65–152)
Glucose, Fasting: 90 mg/dL (ref 65–91)

## 2020-01-02 ENCOUNTER — Telehealth (INDEPENDENT_AMBULATORY_CARE_PROVIDER_SITE_OTHER): Payer: Medicaid Other | Admitting: Family Medicine

## 2020-01-02 DIAGNOSIS — O0990 Supervision of high risk pregnancy, unspecified, unspecified trimester: Secondary | ICD-10-CM

## 2020-01-02 DIAGNOSIS — O0993 Supervision of high risk pregnancy, unspecified, third trimester: Secondary | ICD-10-CM

## 2020-01-02 DIAGNOSIS — Z3A31 31 weeks gestation of pregnancy: Secondary | ICD-10-CM | POA: Diagnosis not present

## 2020-01-02 DIAGNOSIS — D573 Sickle-cell trait: Secondary | ICD-10-CM

## 2020-01-02 DIAGNOSIS — O0933 Supervision of pregnancy with insufficient antenatal care, third trimester: Secondary | ICD-10-CM | POA: Diagnosis not present

## 2020-01-02 NOTE — Progress Notes (Signed)
I connected with  Priscille Kluver on 01/02/20 at  2:55 PM EDT by telephone and verified that I am speaking with the correct person using two identifiers.   I discussed the limitations, risks, security and privacy concerns of performing an evaluation and management service by telephone and the availability of in person appointments. I also discussed with the patient that there may be a patient responsible charge related to this service. The patient expressed understanding and agreed to proceed.  Henrietta Dine, CMA 01/02/2020  2:26 PM

## 2020-01-02 NOTE — Patient Instructions (Signed)

## 2020-01-02 NOTE — Progress Notes (Signed)
Pt states has not received BP Cuff yet from [employer], she wants it delivered to a different address, So gave her their direct number.

## 2020-01-02 NOTE — Progress Notes (Signed)
   TELEHEALTH VIRTUAL OBSTETRICS VISIT ENCOUNTER NOTE  I connected with Shelby Serrano on 01/02/20 at  2:55 PM EDT by telephone at home and verified that I am speaking with the correct person using two identifiers.   I discussed the limitations, risks, security and privacy concerns of performing an evaluation and management service by telephone and the availability of in person appointments. I also discussed with the patient that there may be a patient responsible charge related to this service. The patient expressed understanding and agreed to proceed.  Subjective:  Shelby Serrano is a 24 y.o. [phone] at [redacted]w[redacted]d being followed for ongoing prenatal care.  She is currently monitored for the following issues for this high-risk pregnancy and has Sickle cell trait (HCC); Sciatic nerve pain; Late prenatal care affecting pregnancy in third trimester; and Supervision of high risk pregnancy, antepartum on their problem list.  Patient reports no complaints. Reports fetal movement. Denies any contractions, bleeding or leaking of fluid.   The following portions of the patient's history were reviewed and updated as appropriate: allergies, current medications, past family history, past medical history, past social history, past surgical history and problem list.   Objective:   General:  Alert, oriented and cooperative.   Mental Status: Normal mood and affect perceived. Normal judgment and thought content.  Rest of physical exam deferred due to type of encounter  Assessment and Plan:  Pregnancy: [id number] at [redacted]w[redacted]d 1. Supervision of high risk pregnancy, antepartum - continue routine prenatal care - has not picked up BP cuff or prenatal vitamins yet - normal 2 hour GTT  2. Late prenatal care affecting pregnancy in third trimester - first visit at 29 weeks  3. Sickle cell trait (HCC) - FOB tested, normal hgb elect  Preterm labor symptoms and general obstetric precautions including but not  limited to vaginal bleeding, contractions, leaking of fluid and fetal movement were reviewed in detail with the patient.  I discussed the assessment and treatment plan with the patient. The patient was provided an opportunity to ask questions and all were answered. The patient agreed with the plan and demonstrated an understanding of the instructions. The patient was advised to call back or seek an in-person office evaluation/go to MAU at [facility] for any urgent or concerning symptoms. Please refer to After Visit Summary for other counseling recommendations.   I provided 11 minutes of non-face-to-face time during this encounter.  Return for 2 weeks, HOB.  Marlowe Alt, DO [facility] [employer], [facility]

## 2020-01-08 ENCOUNTER — Encounter: Payer: Self-pay | Admitting: *Deleted

## 2020-01-17 ENCOUNTER — Inpatient Hospital Stay (HOSPITAL_COMMUNITY)
Admission: AD | Admit: 2020-01-17 | Discharge: 2020-01-17 | Disposition: A | Discharge status: Home / Self Care | Payer: Medicaid Other | Attending: Obstetrics and Gynecology | Admitting: Obstetrics and Gynecology

## 2020-01-17 ENCOUNTER — Encounter (HOSPITAL_COMMUNITY): Payer: Self-pay | Admitting: Obstetrics and Gynecology

## 2020-01-17 ENCOUNTER — Other Ambulatory Visit: Payer: Self-pay

## 2020-01-17 DIAGNOSIS — D573 Sickle-cell trait: Secondary | ICD-10-CM | POA: Diagnosis not present

## 2020-01-17 DIAGNOSIS — R109 Unspecified abdominal pain: Secondary | ICD-10-CM | POA: Diagnosis present

## 2020-01-17 DIAGNOSIS — O4703 False labor before 37 completed weeks of gestation, third trimester: Secondary | ICD-10-CM

## 2020-01-17 DIAGNOSIS — Z3A33 33 weeks gestation of pregnancy: Secondary | ICD-10-CM | POA: Insufficient documentation

## 2020-01-17 DIAGNOSIS — O99013 Anemia complicating pregnancy, third trimester: Secondary | ICD-10-CM | POA: Insufficient documentation

## 2020-01-17 DIAGNOSIS — O0990 Supervision of high risk pregnancy, unspecified, unspecified trimester: Secondary | ICD-10-CM

## 2020-01-17 DIAGNOSIS — Z87891 Personal history of nicotine dependence: Secondary | ICD-10-CM | POA: Diagnosis not present

## 2020-01-17 DIAGNOSIS — Z3689 Encounter for other specified antenatal screening: Secondary | ICD-10-CM

## 2020-01-17 LAB — URINALYSIS, ROUTINE W REFLEX MICROSCOPIC
Bilirubin Urine: NEGATIVE
Glucose, UA: NEGATIVE mg/dL
Hgb urine dipstick: NEGATIVE
Ketones, ur: NEGATIVE mg/dL
Leukocytes,Ua: NEGATIVE
Nitrite: NEGATIVE
Protein, ur: NEGATIVE mg/dL
Specific Gravity, Urine: 1.01 (ref 1.005–1.030)
pH: 7 (ref 5.0–8.0)

## 2020-01-17 LAB — WET PREP, GENITAL
Clue Cells Wet Prep HPF POC: NONE SEEN
Sperm: NONE SEEN
Trich, Wet Prep: NONE SEEN
Yeast Wet Prep HPF POC: NONE SEEN

## 2020-01-17 MED ORDER — ACETAMINOPHEN 325 MG PO TABS
650.0000 mg | ORAL_TABLET | Freq: Once | ORAL | Status: AC
  Administered 2020-01-17: 650 mg via ORAL
  Filled 2020-01-17: qty 2

## 2020-01-17 MED ORDER — COMFORT FIT MATERNITY SUPP SM MISC: 1.0000 [IU] | Freq: Every day | 0 refills | Status: DC | PRN

## 2020-01-17 MED ORDER — LACTATED RINGERS IV BOLUS
1000.0000 mL | Freq: Once | INTRAVENOUS | Status: AC
  Administered 2020-01-17: 17:00:00 1000 mL via INTRAVENOUS

## 2020-01-17 MED ORDER — LACTATED RINGERS IV BOLUS
1000.0000 mL | Freq: Once | INTRAVENOUS | Status: AC
  Administered 2020-01-17: 19:00:00 1000 mL via INTRAVENOUS

## 2020-01-17 MED ORDER — BETAMETHASONE SOD PHOS & ACET 6 (3-3) MG/ML IJ SUSP
12.0000 mg | Freq: Once | INTRAMUSCULAR | Status: AC
  Administered 2020-01-17: 17:00:00 12 mg via INTRAMUSCULAR
  Filled 2020-01-17: qty 5

## 2020-01-17 MED ORDER — NIFEDIPINE 10 MG PO CAPS
10.0000 mg | ORAL_CAPSULE | ORAL | Status: DC | PRN
  Administered 2020-01-17 (×3): 10 mg via ORAL
  Filled 2020-01-17 (×3): qty 1

## 2020-01-17 MED ORDER — FENTANYL CITRATE (PF) 100 MCG/2ML IJ SOLN
50.0000 ug | Freq: Once | INTRAMUSCULAR | Status: AC
  Administered 2020-01-17: 20:00:00 50 ug via INTRAVENOUS
  Filled 2020-01-17: qty 2

## 2020-01-17 NOTE — Discharge Instructions (Signed)

## 2020-01-17 NOTE — MAU Provider Note (Signed)
History     CSN: [id number]  Arrival date and time: 01/17/20 1503   First Provider Initiated Contact with Patient 01/17/20 1610      Chief Complaint  Patient presents with  . Contractions   Shelby Serrano is a 24 y.o. G7P3 at [redacted]w[redacted]d who presents to MAU with complaints of abdominal pain and contractions. Patient reports that she started having contractions around 2pm today while at work. She reports cramping started initially then pain became stronger like contractions prior to having babies. Patient denies hx of PTL or PTD. Patient rates pain 5/10. Reports drinking 1 cup of water today and 1 cup of juice. She reports +FM. Denies vaginal bleeding, discharge or LOF. Patient reports recent IC this morning prior to going to work. She has next prenatal appointment scheduled for tomorrow.    OB History    Gravida  7   Para  3   Term  3   Preterm  0   AB  3   Living  3     SAB  3   TAB  0   Ectopic  0   Multiple  0   Live Births  3           Past Medical History:  Diagnosis Date  . Sickle cell trait (HCC)     History reviewed. No pertinent surgical history.  Family History  Problem Relation Age of Onset  . Diabetes Mother   . Hypertension Mother     Social History   Tobacco Use  . Smoking status: Former Smoker    Packs/day: 1.00    Types: Cigarettes    Quit date: 02/08/2016    Years since quitting: 3.9  . Smokeless tobacco: Never Used  Substance Use Topics  . Alcohol use: No  . Drug use: Not Currently    Types: Marijuana    Comment: Patient denies, Positive UDS 05/04/16 [facility] ED    Allergies: No Known Allergies  Medications Prior to Admission  Medication Sig Dispense Refill Last Dose  . cyclobenzaprine (FLEXERIL) 10 MG tablet Take 1 tablet (10 mg total) by mouth 2 (two) times daily as needed for muscle spasms. 20 tablet 0 Past Week at Unknown time  . acetaminophen (TYLENOL) 325 MG tablet Take 2 tablets (650 mg total) by mouth every 4 (four)  hours as needed. 100 tablet 2  at not taking  . metroNIDAZOLE (FLAGYL) 500 MG tablet Take 1 tablet (500 mg total) by mouth 2 (two) times daily. 14 tablet 0   . Prenatal Multivit-Min-Fe-FA (PRENATAL VITAMINS) 0.8 MG tablet Take 1 tablet by mouth daily. (Patient not taking: Reported on 12/20/2019) 30 tablet 12     Review of Systems  Constitutional: Negative.   Respiratory: Negative.   Cardiovascular: Negative.   Gastrointestinal: Positive for abdominal pain. Negative for constipation, diarrhea, nausea and vomiting.  Genitourinary: Negative.   Musculoskeletal: Negative.   Neurological: Negative.   Psychiatric/Behavioral: Negative.    Physical Exam   Blood pressure 113/61, pulse 84, temperature 97.6 F (36.4 C), temperature source Oral, resp. rate 20, height 5\' 1"  (1.549 m), weight 55.6 kg, last menstrual period 04/17/2019, SpO2 100 %, not currently breastfeeding.  Physical Exam  Constitutional: She is oriented to person, place, and time. She appears well-developed and well-nourished. No distress.  Cardiovascular: Normal rate and regular rhythm.  Respiratory: Effort normal and breath sounds normal. No respiratory distress. She has no wheezes.  GI: Soft. There is no abdominal tenderness. There is no rebound and  no guarding.  Gravid appropriate for gestational age, fetal movement palpated by CNM, mild contractions palpated   Musculoskeletal:        General: No edema. Normal range of motion.  Neurological: She is alert and oriented to person, place, and time.  Skin: Skin is warm and dry.  Psychiatric: She has a normal mood and affect. Her behavior is normal. Thought content normal.   FHR: 140/moderate/+accels/no decelerations  Toco: 4-5 minutes/ mild by palpation   Initial cervical examination @ 1630 3.5/thick/-3 vertex   MAU Course  Procedures  MDM IV LR bolus, procardia x3 and BMZ ordered after initial cervical examination  GBS, GC/C and wet prep collected   Recheck cervix after  1.5 hours, minimal cervical change but patient is still complaining of regular contractions.  Patient unable to tolerate pelvic examinations  Dilation: 4 Effacement (%): 30 Cervical Position: Posterior Station: Ballotable Presentation: Vertex Exam by:: Wende Bushy CNM  Second LR bolus ordered and plan to check patient in another 1.5 hours.  IV Fentanyl pain medication given  Wet prep reviewed and WNL   Reassessment @ 2030, no cervical change and patient reports UC have resolved. Discussed with patient plan of care to follow up in the office as scheduled tomorrow. Encouraged patient to present back to MAU or let office know tomorrow if contractions worsen. PTL precautions reviewed. NST reactive and reassuring.   Patient to follow up in MAU tomorrow around 5pm for second BMZ dose, patient verbalizes understanding. Pt stable at time of discharge.   Assessment and Plan   1. Preterm uterine contractions in third trimester, antepartum   2. Supervision of high risk pregnancy, antepartum   3. [redacted] weeks gestation of pregnancy   4. 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   Specialty: Obstetrics and Gynecology Why: Follow up as scheduled for prenatal care tomorrow  Contact information: [address] [place] [address] [place] [facility]. Go on 01/18/2020.   Specialty: Obstetrics and Gynecology Why: Return to MAU tomorrow around 5pm for second [place] information: [address] [id number] [place] [phone]         Allergies as of  01/17/2020   No Known Allergies     Medication List    STOP taking these medications   metroNIDAZOLE 500 MG tablet Commonly known as: Flagyl     TAKE these medications   acetaminophen 325 MG tablet Commonly known as: Tylenol Take 2 tablets (650 mg total) by mouth every 4 (four) hours as needed.   Comfort Fit Maternity Supp Sm Misc 1 Units by Does not apply route daily as needed.   cyclobenzaprine 10 MG tablet Commonly known as: FLEXERIL Take 1 tablet (10 mg total) by mouth 2 (two) times daily as needed for muscle spasms.   Prenatal Vitamins 0.8 MG tablet Take 1 tablet by mouth daily.       Lajean Manes CNM 01/17/2020, 8:59 PM

## 2020-01-17 NOTE — MAU Note (Signed)
Started cramping at work.  Hasn't had much to drink today ( some orange juice and a small cup of water).  No bleeding or leaking.  Feels like when she had her babies.

## 2020-01-18 ENCOUNTER — Other Ambulatory Visit: Payer: Self-pay

## 2020-01-18 ENCOUNTER — Ambulatory Visit (INDEPENDENT_AMBULATORY_CARE_PROVIDER_SITE_OTHER): Payer: Medicaid Other | Admitting: Family Medicine

## 2020-01-18 ENCOUNTER — Inpatient Hospital Stay (HOSPITAL_COMMUNITY)
Admission: AD | Admit: 2020-01-18 | Discharge: 2020-01-18 | Disposition: A | Discharge status: Home / Self Care | Payer: Medicaid Other | Attending: Obstetrics & Gynecology | Admitting: Obstetrics & Gynecology

## 2020-01-18 VITALS — BP 101/61 | HR 86 | Wt 122.9 lb

## 2020-01-18 DIAGNOSIS — O4703 False labor before 37 completed weeks of gestation, third trimester: Secondary | ICD-10-CM | POA: Diagnosis present

## 2020-01-18 DIAGNOSIS — Z3A33 33 weeks gestation of pregnancy: Secondary | ICD-10-CM | POA: Diagnosis not present

## 2020-01-18 DIAGNOSIS — O0990 Supervision of high risk pregnancy, unspecified, unspecified trimester: Secondary | ICD-10-CM

## 2020-01-18 MED ORDER — BLOOD PRESSURE KIT DEVI
1.0000 | Freq: Once | 0 refills | Status: AC
Start: 2020-01-18 — End: 2020-01-18

## 2020-01-18 MED ORDER — BETAMETHASONE SOD PHOS & ACET 6 (3-3) MG/ML IJ SUSP
12.0000 mg | Freq: Once | INTRAMUSCULAR | Status: AC
  Administered 2020-01-18: 12 mg via INTRAMUSCULAR

## 2020-01-18 NOTE — MAU Note (Signed)
Doing better. Little pains here and there but not like yesterday. Little spotting, was told she might.  Having what sound like heartburn(pt had 2 subs and pizza today), discussed use of otc tums.

## 2020-01-18 NOTE — Progress Notes (Signed)
   PRENATAL VISIT NOTE  Subjective:  Shelby Serrano is a 24 y.o. [phone] at 68w4dbeing seen today for ongoing prenatal care.  She is currently monitored for the following issues for this high-risk pregnancy and has Sickle cell trait ([place]; Sciatic nerve pain; Late prenatal care affecting pregnancy in third trimester; and Supervision of high risk pregnancy, antepartum on their problem list.  Patient reports no complaints.  Contractions: Not present. Vag. Bleeding: None.  Movement: Present. Denies leaking of fluid.   The following portions of the patient's history were reviewed and updated as appropriate: allergies, current medications, past family history, past medical history, past social history, past surgical history and problem list.   Objective:   Vitals:   01/18/20 0932  BP: 101/61  Pulse: 86  Weight: 122 lb 14.4 oz (55.7 kg)    Fetal Status:   Fundal Height: 32 cm Movement: Present     General:  Alert, oriented and cooperative. Patient is in no acute distress.  Skin: Skin is warm and dry. No rash noted.   Cardiovascular: Normal heart rate noted  Respiratory: Normal respiratory effort, no problems with respiration noted  Abdomen: Soft, gravid, appropriate for gestational age.  Pain/Pressure: Absent     Pelvic: Cervical exam deferred        Extremities: Normal range of motion.  Edema: None  Mental Status: Normal mood and affect. Normal behavior. Normal judgment and thought content.   Assessment and Plan:  Pregnancy: [id number] 373w4d1. Supervision of high risk pregnancy, antepartum Late entry to care Care is up to date -- will have follow up [place]/19 Reviewed pregnancy box - confirmed desire for BTL- signed consent 4/8, plans to formula feed  - Blood Pressure Monitoring (BLOOD PRESSURE KIT) DEVI; 1 Device by Does not apply route once for 1 dose.  Dispense: 1 each; Refill: 0  2. Preterm uterine contractions in third trimester, antepartum Took procardia last night and  then again at about 0700 AM this AM. No ctx currently Cervical exam stable today in clinic today Given BMZ #1 on 5/6 and scheduled to return 5/7 at 1700 today for second dose   Preterm labor symptoms and general obstetric precautions including but not limited to vaginal bleeding, contractions, leaking of fluid and fetal movement were reviewed in detail with the patient. Please refer to After Visit Summary for other counseling recommendations.   Return in about 2 weeks (around 02/01/2020) for Routine prenatal care, prefers in person.  Future Appointments  Date Time Provider [place]5/19/2021  7:45 AM [facility] US4 [facility]  KiCaren MacadamMD

## 2020-01-19 LAB — CULTURE, BETA STREP (GROUP B ONLY)

## 2020-01-21 LAB — GC/CHLAMYDIA PROBE AMP (~~LOC~~) NOT AT ARMC
Chlamydia: NEGATIVE
Comment: NEGATIVE
Comment: NORMAL
Neisseria Gonorrhea: NEGATIVE

## 2020-01-23 ENCOUNTER — Inpatient Hospital Stay (HOSPITAL_COMMUNITY)
Admission: AD | Admit: 2020-01-23 | Discharge: 2020-01-23 | Disposition: A | Discharge status: Home / Self Care | Payer: Medicaid Other | Attending: Family Medicine | Admitting: Family Medicine

## 2020-01-23 ENCOUNTER — Encounter (HOSPITAL_COMMUNITY): Payer: Self-pay | Admitting: Family Medicine

## 2020-01-23 DIAGNOSIS — O4703 False labor before 37 completed weeks of gestation, third trimester: Secondary | ICD-10-CM

## 2020-01-23 DIAGNOSIS — Z3A34 34 weeks gestation of pregnancy: Secondary | ICD-10-CM | POA: Diagnosis not present

## 2020-01-23 DIAGNOSIS — Z87891 Personal history of nicotine dependence: Secondary | ICD-10-CM | POA: Diagnosis not present

## 2020-01-23 DIAGNOSIS — D573 Sickle-cell trait: Secondary | ICD-10-CM | POA: Insufficient documentation

## 2020-01-23 DIAGNOSIS — O26853 Spotting complicating pregnancy, third trimester: Secondary | ICD-10-CM

## 2020-01-23 DIAGNOSIS — O99013 Anemia complicating pregnancy, third trimester: Secondary | ICD-10-CM | POA: Insufficient documentation

## 2020-01-23 DIAGNOSIS — O0990 Supervision of high risk pregnancy, unspecified, unspecified trimester: Secondary | ICD-10-CM

## 2020-01-23 LAB — URINALYSIS, ROUTINE W REFLEX MICROSCOPIC
Bilirubin Urine: NEGATIVE
Glucose, UA: NEGATIVE mg/dL
Hgb urine dipstick: NEGATIVE
Ketones, ur: NEGATIVE mg/dL
Leukocytes,Ua: NEGATIVE
Nitrite: NEGATIVE
Protein, ur: NEGATIVE mg/dL
Specific Gravity, Urine: 1.005 (ref 1.005–1.030)
pH: 8 (ref 5.0–8.0)

## 2020-01-23 MED ORDER — NIFEDIPINE 10 MG PO CAPS
10.0000 mg | ORAL_CAPSULE | Freq: Four times a day (QID) | ORAL | 0 refills | Status: DC | PRN
Start: 2020-01-23 — End: 2020-02-19

## 2020-01-23 MED ORDER — NIFEDIPINE 10 MG PO CAPS
10.0000 mg | ORAL_CAPSULE | ORAL | Status: DC | PRN
  Administered 2020-01-23 (×2): 10 mg via ORAL
  Filled 2020-01-23 (×2): qty 1

## 2020-01-23 NOTE — Discharge Instructions (Signed)
Braxton Hicks Contractions °Contractions of the uterus can occur throughout pregnancy, but they are not always a sign that you are in labor. You may have practice contractions called Braxton Hicks contractions. These false labor contractions are sometimes confused with true labor. °What are Braxton Hicks contractions? °Braxton Hicks contractions are tightening movements that occur in the muscles of the uterus before labor. Unlike true labor contractions, these contractions do not result in opening (dilation) and thinning of the cervix. Toward the end of pregnancy (32-34 weeks), Braxton Hicks contractions can happen more often and may become stronger. These contractions are sometimes difficult to tell apart from true labor because they can be very uncomfortable. You should not feel embarrassed if you go to the hospital with false labor. °Sometimes, the only way to tell if you are in true labor is for your health care provider to look for changes in the cervix. The health care provider will do a physical exam and may monitor your contractions. If you are not in true labor, the exam should show that your cervix is not dilating and your water has not broken. °If there are no other health problems associated with your pregnancy, it is completely safe for you to be sent home with false labor. You may continue to have Braxton Hicks contractions until you go into true labor. °How to tell the difference between true labor and false labor °True labor °· Contractions last 30-70 seconds. °· Contractions become very regular. °· Discomfort is usually felt in the top of the uterus, and it spreads to the lower abdomen and low back. °· Contractions do not go away with walking. °· Contractions usually become more intense and increase in frequency. °· The cervix dilates and gets thinner. °False labor °· Contractions are usually shorter and not as strong as true labor contractions. °· Contractions are usually irregular. °· Contractions  are often felt in the front of the lower abdomen and in the groin. °· Contractions may go away when you walk around or change positions while lying down. °· Contractions get weaker and are shorter-lasting as time goes on. °· The cervix usually does not dilate or become thin. °Follow these instructions at home: ° °· Take over-the-counter and prescription medicines only as told by your health care provider. °· Keep up with your usual exercises and follow other instructions from your health care provider. °· Eat and drink lightly if you think you are going into labor. °· If Braxton Hicks contractions are making you uncomfortable: °? Change your position from lying down or resting to walking, or change from walking to resting. °? Sit and rest in a tub of warm water. °? Drink enough fluid to keep your urine pale yellow. Dehydration may cause these contractions. °? Do slow and deep breathing several times an hour. °· Keep all follow-up prenatal visits as told by your health care provider. This is important. °Contact a health care provider if: °· You have a fever. °· You have continuous pain in your abdomen. °Get help right away if: °· Your contractions become stronger, more regular, and closer together. °· You have fluid leaking or gushing from your vagina. °· You pass blood-tinged mucus (bloody show). °· You have bleeding from your vagina. °· You have low back pain that you never had before. °· You feel your baby’s head pushing down and causing pelvic pressure. °· Your baby is not moving inside you as much as it used to. °Summary °· Contractions that occur before labor are   called Braxton Hicks contractions, false labor, or practice contractions. °· Braxton Hicks contractions are usually shorter, weaker, farther apart, and less regular than true labor contractions. True labor contractions usually become progressively stronger and regular, and they become more frequent. °· Manage discomfort from Braxton Hicks contractions  by changing position, resting in a warm bath, drinking plenty of water, or practicing deep breathing. °This information is not intended to replace advice given to you by your health care provider. Make sure you discuss any questions you have with your health care provider. °Document Revised: 08/12/2017 Document Reviewed: 01/13/2017 °Elsevier Patient Education © 2020 [employer] ° °

## 2020-01-23 NOTE — MAU Provider Note (Signed)
Chief Complaint:  Abdominal Pain   First Provider Initiated Contact with Patient 01/23/20 0046     HPI: Shelby Serrano is a 24 y.o. [id number] at 53w2dwho presents to maternity admissions reporting preterm uterine contractions.  States they have been coming all day.  Has had some spotting earlier, none now.  . She reports good fetal movement, denies LOF, vaginal bleeding, vaginal itching/burning, urinary symptoms, h/a, dizziness, n/v, diarrhea, constipation or fever/chills.    Abdominal Pain This is a new problem. The current episode started today. The onset quality is gradual. The problem occurs intermittently. The problem has been unchanged. The pain is located in the RLQ, LLQ and suprapubic region. The quality of the pain is cramping. The abdominal pain radiates to the back. Pertinent negatives include no constipation, diarrhea, dysuria, fever, headaches, myalgias, nausea or vomiting. Nothing aggravates the pain. The pain is relieved by nothing. She has tried nothing for the symptoms.    RN Note: PT SAYS HAS LOWER ABD  AND BACK PAIN - ALL DAY .  SPOTTING  AT 3PM.  LAST SEX-  1 WEEK AGO.  Past Medical History: Past Medical History:  Diagnosis Date  . Sickle cell trait ([place])     Past obstetric history: OB History  Gravida Para Term Preterm AB Living  7 3 3  0 3 3  SAB TAB Ectopic Multiple Live Births  3 0 0 0 3    # Outcome Date GA Lbr Len/2nd Weight Sex Delivery Anes PTL Lv  7 Current           6 Term 08/17/18 [redacted]w[redacted]d 08:00 / 00:08 2745 g M Vag-Spont None  LIV  5 Term 07/15/16 [redacted]w[redacted]d 09:37 / 00:07 2725 g F Vag-Spont EPI  LIV  4 SAB 07/2015 [redacted]w[redacted]d         3 SAB 04/2015          2 Term 04/05/12 [redacted]w[redacted]d  2778 g  Vag-Spont   LIV     Birth Comments: no complications  1 SAB             Past Surgical History: No past surgical history on file.  Family History: Family History  Problem Relation Age of Onset  . Diabetes Mother   . Hypertension Mother     Social History: Social  History   Tobacco Use  . Smoking status: Former Smoker    Packs/day: 1.00    Types: Cigarettes    Quit date: 02/08/2016    Years since quitting: 3.9  . Smokeless tobacco: Never Used  Substance Use Topics  . Alcohol use: No  . Drug use: Not Currently    Types: Marijuana    Comment: Patient denies, Positive UDS 05/04/16 [facility] ED    Allergies: No Known Allergies  Meds:  Medications Prior to Admission  Medication Sig Dispense Refill Last Dose  . acetaminophen (TYLENOL) 325 MG tablet Take 2 tablets (650 mg total) by mouth every 4 (four) hours as needed. 100 tablet 2   . cyclobenzaprine (FLEXERIL) 10 MG tablet Take 1 tablet (10 mg total) by mouth 2 (two) times daily as needed for muscle spasms. 20 tablet 0   . Elastic Bandages & Supports (COMFORT FIT MATERNITY SUPP SM) MISC 1 Units by Does not apply route daily as needed. (Patient not taking: Reported on 01/18/2020) 1 each 0   . Prenatal Multivit-Min-Fe-FA (PRENATAL VITAMINS) 0.8 MG tablet Take 1 tablet by mouth daily. (Patient not taking: Reported on 12/20/2019) 30 tablet 12  I have reviewed patient's Past Medical Hx, Surgical Hx, Family Hx, Social Hx, medications and allergies.   ROS:  Review of Systems  Constitutional: Negative for fever.  Gastrointestinal: Positive for abdominal pain. Negative for constipation, diarrhea, nausea and vomiting.  Genitourinary: Negative for dysuria.  Musculoskeletal: Negative for myalgias.  Neurological: Negative for headaches.   Other systems negative  Physical Exam   Patient Vitals for the past 24 hrs:  BP Temp Temp src Pulse Resp Weight  01/23/20 0020 109/65 98.3 F (36.8 C) Oral 86 18 52.4 kg   Constitutional: Well-developed, well-nourished female in no acute distress.  Cardiovascular: normal rate and rhythm Respiratory: normal effort, clear to auscultation bilaterally GI: Abd soft, non-tender, gravid appropriate for gestational age.   No rebound or guarding. MS: Extremities nontender, no  edema, normal ROM Neurologic: Alert and oriented x 4.  GU: Neg CVAT.  PELVIC EXAM: Dilation: 4 Effacement (%): 30 Station: -3 Presentation: Vertex Exam by:: Wynelle Bourgeois, CNM Unchanged when rechecked  FHT:  Baseline 140 , moderate variability, accelerations present, no decelerations Contractions:  Irregular     Labs: Results for orders placed or performed during the hospital encounter of 01/23/20 (from the past 24 hour(s))  Urinalysis, Routine w reflex microscopic     Status: Abnormal   Collection Time: 01/23/20 12:34 AM  Result Value Ref Range   Color, Urine STRAW (A) YELLOW   APPearance CLEAR CLEAR   Specific Gravity, Urine 1.005 1.005 - 1.030   pH 8.0 5.0 - 8.0   Glucose, UA NEGATIVE NEGATIVE mg/dL   Hgb urine dipstick NEGATIVE NEGATIVE   Bilirubin Urine NEGATIVE NEGATIVE   Ketones, ur NEGATIVE NEGATIVE mg/dL   Protein, ur NEGATIVE NEGATIVE mg/dL   Nitrite NEGATIVE NEGATIVE   Leukocytes,Ua NEGATIVE NEGATIVE    O/Positive/-- (04/08 1705)  Imaging:  No results found.  MAU Course/MDM: I have ordered labs and reviewed results. UA is clear and dilute NST reviewed, reactive, category I.  Treatments in MAU included Procardia x 2 doses. This diminished contractions.  Cervix rechecked and showed no change. Discussed we will give her an Rx for Prn use at home.  If contractions don't stop with first procardia at home, come in. .    Assessment: Single IUP at [redacted]w[redacted]d Preterm uterine contractions wthout change in cervix  Plan: Discharge home Rx Procardia for PRN use for contractions at home Preterm Labor precautions and fetal kick counts Follow up in Office for prenatal visits and recheck of cervix  Encouraged to return here or to other Urgent Care/ED if she develops worsening of symptoms, increase in pain, fever, or other concerning symptoms.  Pt stable at time of discharge.  Wynelle Bourgeois CNM, MSN Certified Nurse-Midwife 01/23/2020 12:46 AM

## 2020-01-23 NOTE — MAU Note (Signed)
PT SAYS HAS LOWER ABD  AND BACK PAIN - ALL DAY .  SPOTTING  AT 3PM.  LAST SEX-  1 WEEK AGO.

## 2020-01-30 ENCOUNTER — Ambulatory Visit: Payer: Medicaid Other

## 2020-01-31 ENCOUNTER — Encounter: Payer: Self-pay | Admitting: Medical

## 2020-01-31 ENCOUNTER — Other Ambulatory Visit: Payer: Self-pay

## 2020-01-31 ENCOUNTER — Other Ambulatory Visit: Payer: Self-pay | Admitting: *Deleted

## 2020-01-31 ENCOUNTER — Ambulatory Visit: Payer: Medicaid Other | Attending: Obstetrics and Gynecology

## 2020-01-31 ENCOUNTER — Other Ambulatory Visit: Payer: Self-pay | Admitting: Medical

## 2020-01-31 DIAGNOSIS — Z3687 Encounter for antenatal screening for uncertain dates: Secondary | ICD-10-CM | POA: Diagnosis not present

## 2020-01-31 DIAGNOSIS — IMO0002 Reserved for concepts with insufficient information to code with codable children: Secondary | ICD-10-CM

## 2020-01-31 DIAGNOSIS — O0933 Supervision of pregnancy with insufficient antenatal care, third trimester: Secondary | ICD-10-CM

## 2020-01-31 DIAGNOSIS — Z3A35 35 weeks gestation of pregnancy: Secondary | ICD-10-CM

## 2020-01-31 DIAGNOSIS — Z0489 Encounter for examination and observation for other specified reasons: Secondary | ICD-10-CM | POA: Diagnosis not present

## 2020-01-31 DIAGNOSIS — Z362 Encounter for other antenatal screening follow-up: Secondary | ICD-10-CM | POA: Diagnosis not present

## 2020-01-31 DIAGNOSIS — O0990 Supervision of high risk pregnancy, unspecified, unspecified trimester: Secondary | ICD-10-CM | POA: Diagnosis not present

## 2020-01-31 DIAGNOSIS — O365990 Maternal care for other known or suspected poor fetal growth, unspecified trimester, not applicable or unspecified: Secondary | ICD-10-CM | POA: Insufficient documentation

## 2020-01-31 DIAGNOSIS — O365930 Maternal care for other known or suspected poor fetal growth, third trimester, not applicable or unspecified: Secondary | ICD-10-CM

## 2020-02-04 ENCOUNTER — Ambulatory Visit (INDEPENDENT_AMBULATORY_CARE_PROVIDER_SITE_OTHER): Payer: Medicaid Other | Admitting: Family Medicine

## 2020-02-04 ENCOUNTER — Other Ambulatory Visit: Payer: Self-pay

## 2020-02-04 ENCOUNTER — Other Ambulatory Visit (HOSPITAL_COMMUNITY)
Admission: RE | Admit: 2020-02-04 | Discharge: 2020-02-04 | Disposition: A | Discharge status: Home / Self Care | Payer: Medicaid Other | Source: Ambulatory Visit | Attending: Family Medicine | Admitting: Family Medicine

## 2020-02-04 VITALS — BP 92/63 | Wt 123.2 lb

## 2020-02-04 DIAGNOSIS — O0990 Supervision of high risk pregnancy, unspecified, unspecified trimester: Secondary | ICD-10-CM | POA: Diagnosis present

## 2020-02-04 DIAGNOSIS — O0933 Supervision of pregnancy with insufficient antenatal care, third trimester: Secondary | ICD-10-CM

## 2020-02-04 DIAGNOSIS — O365930 Maternal care for other known or suspected poor fetal growth, third trimester, not applicable or unspecified: Secondary | ICD-10-CM

## 2020-02-04 DIAGNOSIS — Z3A36 36 weeks gestation of pregnancy: Secondary | ICD-10-CM

## 2020-02-04 DIAGNOSIS — O365990 Maternal care for other known or suspected poor fetal growth, unspecified trimester, not applicable or unspecified: Secondary | ICD-10-CM

## 2020-02-04 NOTE — Progress Notes (Signed)
   PRENATAL VISIT NOTE  Subjective:  Shelby Serrano is a 24 y.o. [phone] at [redacted]w[redacted]d being seen today for ongoing prenatal care.  She is currently monitored for the following issues for this low-risk pregnancy and has Sickle cell trait (HCC); Sciatic nerve pain; Late prenatal care affecting pregnancy in third trimester; Supervision of high risk pregnancy, antepartum; and Pregnancy affected by fetal growth restriction on their problem list.  Patient reports having back pain/sciatic pain. Reports pelvic pressure. Noted increased vaginal fluid last night and also "had to change the sheets" due to vaginal fluid.  Contractions: Not present. Vag. Bleeding: None.  Movement: Present. Reports some leaking of fluid.   The following portions of the patient's history were reviewed and updated as appropriate: allergies, current medications, past family history, past medical history, past social history, past surgical history and problem list.   Objective:   Vitals:   02/04/20 1422  BP: 92/63  Weight: 123 lb 3.2 oz (55.9 kg)    Fetal Status: Fetal Heart Rate (bpm): 144 Fundal Height: 36 cm Movement: Present  Presentation: Vertex  General:  Alert, oriented and cooperative. Patient is in no acute distress.  Skin: Skin is warm and dry. No rash noted.   Cardiovascular: Normal heart rate noted  Respiratory: Normal respiratory effort, no problems with respiration noted  Abdomen: Soft, gravid, appropriate for gestational age.  Pain/Pressure: Present     Pelvic: Cervical exam performed in the presence of a chaperone Dilation: 4 Effacement (%): Thick Station: -3. Discharge is thin and white, mild pooling at the end of speculum, does not increase with valsalva.   Extremities: Normal range of motion.  Edema: None  Mental Status: Normal mood and affect. Normal behavior. Normal judgment and thought content.   Assessment and Plan:  Pregnancy: [id number] at [redacted]w[redacted]d  1. Supervision of high risk pregnancy, antepartum  - +pooling, negative ferning, no nitrazine paper or swabs in clinic but bases on exam I think this is unlikely ROM. Counseled patient to present to hospital for continued fluid leaking. Would recommend AFI and amniosure at that time. - Will recollect samples due to needing these to be valid at later term.   - Culture, beta strep (group b only) - Cervicovaginal ancillary only( [place])  2. Pregnancy affected by fetal growth restriction Has f/u US  3. Late prenatal care affecting pregnancy in third trimester Up to date currently  Preterm labor symptoms and general obstetric precautions including but not limited to vaginal bleeding, contractions, leaking of fluid and fetal movement were reviewed in detail with the patient. Please refer to After Visit Summary for other counseling recommendations.   Return in about 1 week (around 02/11/2020) for Routine prenatal care.  Future Appointments  Date Time Provider Department Center  02/07/2020  3:00 PM [facility] NURSE [facility]  02/07/2020  3:00 PM [facility] US1 [facility]  02/12/2020 10:55 AM Joselyn Arrow, MD [facility]  02/15/2020  3:15 PM [facility] NURSE [facility]  02/15/2020  3:15 PM [facility] US3 [facility]  02/21/2020  8:35 AM Levie Heritage, DO [facility]  02/21/2020  3:45 PM [facility] NURSE [facility]  02/21/2020  3:45 PM [facility] US4 [facility]    [place] Althia Forts, MD

## 2020-02-05 LAB — CERVICOVAGINAL ANCILLARY ONLY
Chlamydia: NEGATIVE
Comment: NEGATIVE
Comment: NORMAL
Neisseria Gonorrhea: NEGATIVE

## 2020-02-07 ENCOUNTER — Ambulatory Visit: Payer: Medicaid Other | Attending: Obstetrics and Gynecology

## 2020-02-07 ENCOUNTER — Ambulatory Visit: Payer: Medicaid Other | Admitting: *Deleted

## 2020-02-07 ENCOUNTER — Other Ambulatory Visit: Payer: Self-pay

## 2020-02-07 DIAGNOSIS — O365990 Maternal care for other known or suspected poor fetal growth, unspecified trimester, not applicable or unspecified: Secondary | ICD-10-CM | POA: Insufficient documentation

## 2020-02-07 DIAGNOSIS — O0933 Supervision of pregnancy with insufficient antenatal care, third trimester: Secondary | ICD-10-CM | POA: Diagnosis not present

## 2020-02-07 DIAGNOSIS — O365930 Maternal care for other known or suspected poor fetal growth, third trimester, not applicable or unspecified: Secondary | ICD-10-CM

## 2020-02-07 DIAGNOSIS — O0990 Supervision of high risk pregnancy, unspecified, unspecified trimester: Secondary | ICD-10-CM

## 2020-02-07 DIAGNOSIS — Z3A36 36 weeks gestation of pregnancy: Secondary | ICD-10-CM | POA: Diagnosis not present

## 2020-02-07 LAB — CULTURE, BETA STREP (GROUP B ONLY): Strep Gp B Culture: NEGATIVE

## 2020-02-12 ENCOUNTER — Ambulatory Visit (INDEPENDENT_AMBULATORY_CARE_PROVIDER_SITE_OTHER): Payer: Medicaid Other | Admitting: Family Medicine

## 2020-02-12 ENCOUNTER — Other Ambulatory Visit: Payer: Self-pay

## 2020-02-12 VITALS — BP 100/61 | HR 75 | Wt 123.5 lb

## 2020-02-12 DIAGNOSIS — O365990 Maternal care for other known or suspected poor fetal growth, unspecified trimester, not applicable or unspecified: Secondary | ICD-10-CM

## 2020-02-12 DIAGNOSIS — O0990 Supervision of high risk pregnancy, unspecified, unspecified trimester: Secondary | ICD-10-CM

## 2020-02-12 DIAGNOSIS — O0933 Supervision of pregnancy with insufficient antenatal care, third trimester: Secondary | ICD-10-CM

## 2020-02-12 DIAGNOSIS — Z3A37 37 weeks gestation of pregnancy: Secondary | ICD-10-CM

## 2020-02-12 DIAGNOSIS — O365930 Maternal care for other known or suspected poor fetal growth, third trimester, not applicable or unspecified: Secondary | ICD-10-CM

## 2020-02-12 NOTE — Progress Notes (Signed)
C/o pelvic and lower back pain and may be contractions last 3 days, can;t sleep. Hard to walk.  Has been taking procardia- it didn't help.  Saoirse Legere,RN

## 2020-02-12 NOTE — Progress Notes (Signed)
   PRENATAL VISIT NOTE  Subjective:  Shelby Serrano is a 24 y.o. [phone] at [redacted]w[redacted]d being seen today for ongoing prenatal care.  She is currently monitored for the following issues for this low-risk pregnancy and has Sickle cell trait (HCC); Sciatic nerve pain; Late prenatal care affecting pregnancy in third trimester; Supervision of high risk pregnancy, antepartum; and Pregnancy affected by fetal growth restriction on their problem list.   Contractions: Irregular. Vag. Bleeding: None.  Movement: Present. Denies leaking of fluid.   The following portions of the patient's history were reviewed and updated as appropriate: allergies, current medications, past family history, past medical history, past social history, past surgical history and problem list.   Objective:   Vitals:   02/12/20 1124  BP: 100/61  Pulse: 75  Weight: 123 lb 8 oz (56 kg)    Fetal Status: Fetal Heart Rate (bpm): 138 Fundal Height: 36 cm Movement: Present  Presentation: Vertex  General:  Alert, oriented and cooperative. Patient is in no acute distress.  Skin: Skin is warm and dry. No rash noted.   Cardiovascular: Normal heart rate noted  Respiratory: Normal respiratory effort, no problems with respiration noted  Abdomen: Soft, gravid, appropriate for gestational age.  Pain/Pressure: Present     Pelvic: Cervical exam performed in the presence of a chaperone Dilation: 4 Effacement (%): 20 Station: -3  Extremities: Normal range of motion.  Edema: None  Mental Status: Normal mood and affect. Normal behavior. Normal judgment and thought content.   Assessment and Plan:  Pregnancy: [id number] at [redacted]w[redacted]d 1. Supervision of high risk pregnancy, antepartum - RTC in 1 week - MFM [place] later this week and will likely be induced between 38-39 weeks for FGR - Possibly early labor; return precautions given - GBS neg - Girl, Breast, BTL vs IUD  2. Late prenatal care affecting pregnancy in third trimester - Up to Date  3.  Pregnancy affected by fetal growth restriction - Repeat US this week; previously EFW 7%  Term labor symptoms and general obstetric precautions including but not limited to vaginal bleeding, contractions, leaking of fluid and fetal movement were reviewed in detail with the patient. Please refer to After Visit Summary for other counseling recommendations.   No follow-ups on file.  Future Appointments  Date Time Provider Department Center  02/15/2020  3:15 PM [facility] NURSE [facility]  02/15/2020  3:15 PM [facility] US3 [facility]  02/21/2020  8:35 AM Levie Heritage, DO [facility]  02/21/2020  3:45 PM [facility] NURSE [facility]  02/21/2020  3:45 PM [facility] US4 [facility]    Joselyn Arrow, MD

## 2020-02-15 ENCOUNTER — Ambulatory Visit: Payer: Medicaid Other | Admitting: *Deleted

## 2020-02-15 ENCOUNTER — Ambulatory Visit: Payer: Medicaid Other | Attending: Obstetrics and Gynecology

## 2020-02-15 ENCOUNTER — Other Ambulatory Visit: Payer: Self-pay

## 2020-02-15 ENCOUNTER — Other Ambulatory Visit: Payer: Self-pay | Admitting: Family Medicine

## 2020-02-15 DIAGNOSIS — O0990 Supervision of high risk pregnancy, unspecified, unspecified trimester: Secondary | ICD-10-CM

## 2020-02-15 DIAGNOSIS — O0933 Supervision of pregnancy with insufficient antenatal care, third trimester: Secondary | ICD-10-CM

## 2020-02-15 DIAGNOSIS — Z3A37 37 weeks gestation of pregnancy: Secondary | ICD-10-CM | POA: Diagnosis not present

## 2020-02-15 DIAGNOSIS — O365930 Maternal care for other known or suspected poor fetal growth, third trimester, not applicable or unspecified: Secondary | ICD-10-CM

## 2020-02-15 DIAGNOSIS — O365990 Maternal care for other known or suspected poor fetal growth, unspecified trimester, not applicable or unspecified: Secondary | ICD-10-CM | POA: Insufficient documentation

## 2020-02-18 ENCOUNTER — Encounter (HOSPITAL_COMMUNITY)
Admission: AD | Disposition: A | Discharge status: Home / Self Care | Payer: Self-pay | Source: Home / Self Care | Attending: Obstetrics and Gynecology

## 2020-02-18 ENCOUNTER — Other Ambulatory Visit: Payer: Self-pay

## 2020-02-18 ENCOUNTER — Inpatient Hospital Stay (HOSPITAL_COMMUNITY): Payer: Medicaid Other | Admitting: Anesthesiology

## 2020-02-18 ENCOUNTER — Inpatient Hospital Stay (HOSPITAL_COMMUNITY)
Admission: AD | Admit: 2020-02-18 | Discharge: 2020-02-19 | DRG: 798 | Disposition: A | Discharge status: Home / Self Care | Payer: Medicaid Other | Attending: Obstetrics and Gynecology | Admitting: Obstetrics and Gynecology

## 2020-02-18 ENCOUNTER — Encounter (HOSPITAL_COMMUNITY): Payer: Self-pay | Admitting: Family Medicine

## 2020-02-18 DIAGNOSIS — O26893 Other specified pregnancy related conditions, third trimester: Secondary | ICD-10-CM | POA: Diagnosis present

## 2020-02-18 DIAGNOSIS — O9902 Anemia complicating childbirth: Principal | ICD-10-CM | POA: Diagnosis present

## 2020-02-18 DIAGNOSIS — O0990 Supervision of high risk pregnancy, unspecified, unspecified trimester: Secondary | ICD-10-CM

## 2020-02-18 DIAGNOSIS — Z87891 Personal history of nicotine dependence: Secondary | ICD-10-CM

## 2020-02-18 DIAGNOSIS — Z302 Encounter for sterilization: Secondary | ICD-10-CM | POA: Diagnosis not present

## 2020-02-18 DIAGNOSIS — Z3A38 38 weeks gestation of pregnancy: Secondary | ICD-10-CM

## 2020-02-18 DIAGNOSIS — O0933 Supervision of pregnancy with insufficient antenatal care, third trimester: Secondary | ICD-10-CM

## 2020-02-18 DIAGNOSIS — Z20822 Contact with and (suspected) exposure to covid-19: Secondary | ICD-10-CM | POA: Diagnosis present

## 2020-02-18 DIAGNOSIS — O365990 Maternal care for other known or suspected poor fetal growth, unspecified trimester, not applicable or unspecified: Secondary | ICD-10-CM | POA: Diagnosis present

## 2020-02-18 DIAGNOSIS — Z3009 Encounter for other general counseling and advice on contraception: Secondary | ICD-10-CM

## 2020-02-18 DIAGNOSIS — D573 Sickle-cell trait: Secondary | ICD-10-CM | POA: Diagnosis present

## 2020-02-18 HISTORY — PX: TUBAL LIGATION: SHX77

## 2020-02-18 LAB — CBC
HCT: 37 % (ref 36.0–46.0)
Hemoglobin: 11.8 g/dL — ABNORMAL LOW (ref 12.0–15.0)
MCH: 26.8 pg (ref 26.0–34.0)
MCHC: 31.9 g/dL (ref 30.0–36.0)
MCV: 83.9 fL (ref 80.0–100.0)
Platelets: 181 10*3/uL (ref 150–400)
RBC: 4.41 MIL/uL (ref 3.87–5.11)
RDW: 14.7 % (ref 11.5–15.5)
WBC: 15.2 10*3/uL — ABNORMAL HIGH (ref 4.0–10.5)
nRBC: 0 % (ref 0.0–0.2)

## 2020-02-18 LAB — SARS CORONAVIRUS 2 BY RT PCR (HOSPITAL ORDER, PERFORMED IN ~~LOC~~ HOSPITAL LAB): SARS Coronavirus 2: NEGATIVE

## 2020-02-18 LAB — ABO/RH: ABO/RH(D): O POS

## 2020-02-18 LAB — RPR: RPR Ser Ql: NONREACTIVE

## 2020-02-18 LAB — TYPE AND SCREEN
ABO/RH(D): O POS
Antibody Screen: NEGATIVE

## 2020-02-18 SURGERY — LIGATION, FALLOPIAN TUBE, POSTPARTUM
Anesthesia: Spinal | Site: Abdomen | Laterality: Bilateral | Wound class: Clean

## 2020-02-18 MED ORDER — OXYTOCIN-SODIUM CHLORIDE 30-0.9 UT/500ML-% IV SOLN
1.0000 m[IU]/min | INTRAVENOUS | Status: DC
  Filled 2020-02-18: qty 500

## 2020-02-18 MED ORDER — BENZOCAINE-MENTHOL 20-0.5 % EX AERO: 1.0000 "application " | INHALATION_SPRAY | CUTANEOUS | Status: DC | PRN

## 2020-02-18 MED ORDER — METHYLERGONOVINE MALEATE 0.2 MG/ML IJ SOLN
INTRAMUSCULAR | Status: AC
  Filled 2020-02-18: qty 1

## 2020-02-18 MED ORDER — MEASLES, MUMPS & RUBELLA VAC IJ SOLR: 0.5000 mL | Freq: Once | INTRAMUSCULAR | Status: DC

## 2020-02-18 MED ORDER — ONDANSETRON HCL 4 MG PO TABS: 4.0000 mg | ORAL_TABLET | ORAL | Status: DC | PRN

## 2020-02-18 MED ORDER — METHYLERGONOVINE MALEATE 0.2 MG/ML IJ SOLN: 0.2000 mg | Freq: Once | INTRAMUSCULAR | Status: DC

## 2020-02-18 MED ORDER — LACTATED RINGERS IV SOLN: 500.0000 mL | INTRAVENOUS | Status: DC | PRN

## 2020-02-18 MED ORDER — SOD CITRATE-CITRIC ACID 500-334 MG/5ML PO SOLN
30.0000 mL | Freq: Once | ORAL | Status: AC
  Administered 2020-02-18: 30 mL via ORAL

## 2020-02-18 MED ORDER — METHYLERGONOVINE MALEATE 0.2 MG/ML IJ SOLN
0.2000 mg | Freq: Once | INTRAMUSCULAR | Status: AC
  Administered 2020-02-18: 0.2 mg via INTRAMUSCULAR

## 2020-02-18 MED ORDER — IBUPROFEN 600 MG PO TABS
600.0000 mg | ORAL_TABLET | Freq: Three times a day (TID) | ORAL | Status: DC | PRN
  Administered 2020-02-18 – 2020-02-19 (×3): 600 mg via ORAL
  Filled 2020-02-18 (×3): qty 1

## 2020-02-18 MED ORDER — OXYTOCIN-SODIUM CHLORIDE 30-0.9 UT/500ML-% IV SOLN
2.5000 [IU]/h | INTRAVENOUS | Status: DC
  Administered 2020-02-18: 2.5 [IU]/h via INTRAVENOUS

## 2020-02-18 MED ORDER — LACTATED RINGERS IV SOLN: INTRAVENOUS | Status: DC | PRN

## 2020-02-18 MED ORDER — PROMETHAZINE HCL 25 MG/ML IJ SOLN: 6.2500 mg | INTRAMUSCULAR | Status: DC | PRN

## 2020-02-18 MED ORDER — FENTANYL CITRATE (PF) 100 MCG/2ML IJ SOLN
50.0000 ug | Freq: Once | INTRAMUSCULAR | Status: AC
  Administered 2020-02-18: 50 ug via INTRAVENOUS

## 2020-02-18 MED ORDER — BUPIVACAINE HCL (PF) 0.5 % IJ SOLN
INTRAMUSCULAR | Status: AC
  Filled 2020-02-18: qty 30

## 2020-02-18 MED ORDER — FENTANYL CITRATE (PF) 100 MCG/2ML IJ SOLN
INTRAMUSCULAR | Status: DC | PRN
  Administered 2020-02-18 (×2): 50 ug via INTRAVENOUS

## 2020-02-18 MED ORDER — LIDOCAINE HCL (PF) 1 % IJ SOLN: 30.0000 mL | INTRAMUSCULAR | Status: DC | PRN

## 2020-02-18 MED ORDER — FENTANYL CITRATE (PF) 100 MCG/2ML IJ SOLN: 25.0000 ug | INTRAMUSCULAR | Status: DC | PRN

## 2020-02-18 MED ORDER — PRENATAL MULTIVITAMIN CH
1.0000 | ORAL_TABLET | Freq: Every day | ORAL | Status: DC
  Administered 2020-02-18: 1 via ORAL
  Filled 2020-02-18 (×2): qty 1

## 2020-02-18 MED ORDER — DIPHENHYDRAMINE HCL 25 MG PO CAPS: 25.0000 mg | ORAL_CAPSULE | Freq: Four times a day (QID) | ORAL | Status: DC | PRN

## 2020-02-18 MED ORDER — ONDANSETRON HCL 4 MG/2ML IJ SOLN: 4.0000 mg | INTRAMUSCULAR | Status: DC | PRN

## 2020-02-18 MED ORDER — OXYTOCIN BOLUS FROM INFUSION
500.0000 mL | Freq: Once | INTRAVENOUS | Status: AC
  Administered 2020-02-18: 500 mL/h via INTRAVENOUS

## 2020-02-18 MED ORDER — SIMETHICONE 80 MG PO CHEW
80.0000 mg | CHEWABLE_TABLET | ORAL | Status: DC | PRN
  Administered 2020-02-18 (×2): 80 mg via ORAL
  Filled 2020-02-18 (×2): qty 1

## 2020-02-18 MED ORDER — ACETAMINOPHEN 10 MG/ML IV SOLN
INTRAVENOUS | Status: DC | PRN
Start: 2020-02-18 — End: 2020-02-18
  Administered 2020-02-18: 600 mg via INTRAVENOUS
  Administered 2020-02-18: 1000 mg via INTRAVENOUS

## 2020-02-18 MED ORDER — OXYCODONE HCL 5 MG PO TABS
5.0000 mg | ORAL_TABLET | ORAL | Status: DC | PRN
  Administered 2020-02-18 – 2020-02-19 (×4): 5 mg via ORAL
  Filled 2020-02-18 (×4): qty 1

## 2020-02-18 MED ORDER — BUPIVACAINE IN DEXTROSE 0.75-8.25 % IT SOLN
INTRATHECAL | Status: DC | PRN
  Administered 2020-02-18: 1.4 mL via INTRATHECAL

## 2020-02-18 MED ORDER — WITCH HAZEL-GLYCERIN EX PADS: 1.0000 "application " | MEDICATED_PAD | CUTANEOUS | Status: DC | PRN

## 2020-02-18 MED ORDER — SODIUM CHLORIDE 0.9 % IR SOLN
Status: DC | PRN
  Administered 2020-02-18: 1000 mL

## 2020-02-18 MED ORDER — FENTANYL CITRATE (PF) 100 MCG/2ML IJ SOLN
INTRAMUSCULAR | Status: AC
  Filled 2020-02-18: qty 2

## 2020-02-18 MED ORDER — SENNOSIDES-DOCUSATE SODIUM 8.6-50 MG PO TABS
2.0000 | ORAL_TABLET | ORAL | Status: DC
  Administered 2020-02-18: 2 via ORAL
  Filled 2020-02-18: qty 2

## 2020-02-18 MED ORDER — TERBUTALINE SULFATE 1 MG/ML IJ SOLN: 0.2500 mg | Freq: Once | INTRAMUSCULAR | Status: DC | PRN

## 2020-02-18 MED ORDER — METHYLERGONOVINE MALEATE 0.2 MG PO TABS: 0.2000 mg | ORAL_TABLET | Freq: Once | ORAL | Status: DC

## 2020-02-18 MED ORDER — TETANUS-DIPHTH-ACELL PERTUSSIS 5-2.5-18.5 LF-MCG/0.5 IM SUSP: 0.5000 mL | Freq: Once | INTRAMUSCULAR | Status: DC

## 2020-02-18 MED ORDER — LACTATED RINGERS IV SOLN: INTRAVENOUS | Status: DC

## 2020-02-18 MED ORDER — ONDANSETRON HCL 4 MG/2ML IJ SOLN
INTRAMUSCULAR | Status: DC | PRN
  Administered 2020-02-18: 4 mg via INTRAVENOUS

## 2020-02-18 MED ORDER — BUPIVACAINE HCL 0.5 % IJ SOLN
INTRAMUSCULAR | Status: DC | PRN
  Administered 2020-02-18 (×2): 10 mL

## 2020-02-18 MED ORDER — MIDAZOLAM HCL 5 MG/5ML IJ SOLN
INTRAMUSCULAR | Status: DC | PRN
  Administered 2020-02-18: 1 mg via INTRAVENOUS

## 2020-02-18 MED ORDER — FAMOTIDINE 20 MG PO TABS
20.0000 mg | ORAL_TABLET | Freq: Once | ORAL | Status: AC
  Administered 2020-02-18: 20 mg via ORAL
  Filled 2020-02-18: qty 1

## 2020-02-18 MED ORDER — COCONUT OIL OIL: 1.0000 "application " | TOPICAL_OIL | Status: DC | PRN

## 2020-02-18 MED ORDER — ACETAMINOPHEN 325 MG PO TABS: 650.0000 mg | ORAL_TABLET | ORAL | Status: DC | PRN

## 2020-02-18 MED ORDER — SOD CITRATE-CITRIC ACID 500-334 MG/5ML PO SOLN
30.0000 mL | ORAL | Status: DC | PRN
  Filled 2020-02-18: qty 30

## 2020-02-18 MED ORDER — DIBUCAINE (PERIANAL) 1 % EX OINT: 1.0000 "application " | TOPICAL_OINTMENT | CUTANEOUS | Status: DC | PRN

## 2020-02-18 MED ORDER — ONDANSETRON HCL 4 MG/2ML IJ SOLN: 4.0000 mg | Freq: Four times a day (QID) | INTRAMUSCULAR | Status: DC | PRN

## 2020-02-18 MED ORDER — IBUPROFEN 600 MG PO TABS
600.0000 mg | ORAL_TABLET | Freq: Once | ORAL | Status: AC
  Administered 2020-02-18: 600 mg via ORAL
  Filled 2020-02-18: qty 1

## 2020-02-18 MED ORDER — ACETAMINOPHEN 325 MG PO TABS
650.0000 mg | ORAL_TABLET | Freq: Four times a day (QID) | ORAL | Status: DC | PRN
  Administered 2020-02-18 – 2020-02-19 (×4): 650 mg via ORAL
  Filled 2020-02-18 (×4): qty 2

## 2020-02-18 SURGICAL SUPPLY — 28 items
CLIP FILSHIE TUBAL LIGA STRL (Clip) ×2 IMPLANT
CLOTH BEACON ORANGE TIMEOUT ST (SAFETY) ×3 IMPLANT
DERMABOND ADVANCED (GAUZE/BANDAGES/DRESSINGS) ×2
DERMABOND ADVANCED .7 DNX12 (GAUZE/BANDAGES/DRESSINGS) ×1 IMPLANT
DRSG OPSITE POSTOP 3X4 (GAUZE/BANDAGES/DRESSINGS) ×3 IMPLANT
DURAPREP 26ML APPLICATOR (WOUND CARE) ×3 IMPLANT
ELECT REM PT RETURN 9FT ADLT (ELECTROSURGICAL) ×3
ELECTRODE REM PT RTRN 9FT ADLT (ELECTROSURGICAL) ×1 IMPLANT
GLOVE BIO SURGEON STRL SZ7 (GLOVE) ×3 IMPLANT
GLOVE BIOGEL PI IND STRL 7.0 (GLOVE) ×1 IMPLANT
GLOVE BIOGEL PI IND STRL 7.5 (GLOVE) ×1 IMPLANT
GLOVE BIOGEL PI INDICATOR 7.0 (GLOVE) ×2
GLOVE BIOGEL PI INDICATOR 7.5 (GLOVE) ×2
GOWN STRL REUS W/TWL LRG LVL3 (GOWN DISPOSABLE) ×6 IMPLANT
NEEDLE HYPO 22GX1.5 SAFETY (NEEDLE) ×3 IMPLANT
NS IRRIG 1000ML POUR BTL (IV SOLUTION) ×3 IMPLANT
PACK ABDOMINAL MINOR (CUSTOM PROCEDURE TRAY) ×3 IMPLANT
PENCIL BUTTON HOLSTER BLD 10FT (ELECTRODE) IMPLANT
PROTECTOR NERVE ULNAR (MISCELLANEOUS) ×3 IMPLANT
SPONGE LAP 4X18 RFD (DISPOSABLE) ×3 IMPLANT
SUT MNCRL AB 4-0 PS2 18 (SUTURE) ×3 IMPLANT
SUT PLAIN 2 0 (SUTURE)
SUT PLAIN ABS 2-0 CT1 27XMFL (SUTURE) IMPLANT
SUT VICRYL 0 TIES 12 18 (SUTURE) IMPLANT
SUT VICRYL 0 UR6 27IN ABS (SUTURE) ×3 IMPLANT
SYR CONTROL 10ML LL (SYRINGE) ×3 IMPLANT
TOWEL OR 17X24 6PK STRL BLUE (TOWEL DISPOSABLE) ×6 IMPLANT
TRAY FOLEY CATH SILVER 14FR (SET/KITS/TRAYS/PACK) ×3 IMPLANT

## 2020-02-18 NOTE — Anesthesia Postprocedure Evaluation (Signed)
Anesthesia Post Note  Patient: Shelby Serrano  Procedure(s) Performed: POST PARTUM TUBAL LIGATION (Bilateral Abdomen)     Patient location during evaluation: PACU Anesthesia Type: Spinal Level of consciousness: oriented, awake and alert and awake Pain management: pain level controlled Vital Signs Assessment: post-procedure vital signs reviewed and stable Respiratory status: spontaneous breathing, respiratory function stable, patient connected to nasal cannula oxygen and nonlabored ventilation Cardiovascular status: blood pressure returned to baseline and stable Postop Assessment: no headache, no backache, no apparent nausea or vomiting, patient able to bend at knees and spinal receding Anesthetic complications: no    Last Vitals:  Vitals:   02/18/20 1352 02/18/20 1500  BP: 112/80 125/82  Pulse: (!) 51 62  Resp: 18 16  Temp:  36.4 C  SpO2: 100% 100%    Last Pain:  Vitals:   02/18/20 1500  TempSrc: Oral  PainSc:    Pain Goal:                   Cecile Hearing

## 2020-02-18 NOTE — Transfer of Care (Signed)
Immediate Anesthesia Transfer of Care Note  Patient: Shelby Serrano  Procedure(s) Performed: POST PARTUM TUBAL LIGATION (Bilateral Abdomen)  Patient Location: PACU  Anesthesia Type:Spinal  Level of Consciousness: awake, alert  and oriented  Airway & Oxygen Therapy: Patient Spontanous Breathing  Post-op Assessment: Report given to RN and Post -op Vital signs reviewed and stable  Post vital signs: Reviewed and stable  Last Vitals:  Vitals Value Taken Time  BP 107/67 02/18/20 1214  Temp    Pulse 69 02/18/20 1215  Resp 16 02/18/20 1215  SpO2 99 % 02/18/20 1215  Vitals shown include unvalidated device data.  Last Pain:  Vitals:   02/18/20 0805  TempSrc: Oral         Complications: No apparent anesthesia complications

## 2020-02-18 NOTE — Discharge Summary (Addendum)
Postpartum Discharge Summary     Patient Name: Shelby Serrano DOB: Jan 27, 1996 MRN: [id number]  Date of admission: 02/18/2020 Delivery date:02/18/2020  Delivering provider: Chauncey Mann  Date of discharge: 02/19/2020  Admitting diagnosis: Labor and delivery, indication for care [O75.9] Intrauterine pregnancy: [redacted]w[redacted]d    Secondary diagnosis:  Active Problems:   Sickle cell trait ([place]   Late prenatal care affecting pregnancy in third trimester   Pregnancy affected by fetal growth restriction   Unwanted fertility  Additional problems: None    Discharge diagnosis: Term Pregnancy Delivered                                              Post partum procedures:postpartum tubal ligation Augmentation: AROM Complications: None  Hospital course: Onset of Labor With Vaginal Delivery      24y.o. yo [id number] 318w0das admitted in Active Labor on 02/18/2020. Patient had an uncomplicated labor course as follows: Initial SVE: 7/80/0. AROM performed. She then progressed to complete.  Membrane Rupture Time/Date: 7:45 AM ,02/18/2020   Delivery Method:Vaginal, Spontaneous  Episiotomy: None  Lacerations:  None  Patient had an uncomplicated postpartum course. BTL done on 02/18/2020. She is ambulating, tolerating a regular diet, passing flatus, and urinating well. Patient is discharged home in stable condition on 02/19/20 per her request for early discharge.  Newborn Data: Birth date:02/18/2020  Birth time:7:49 AM  Gender:Female  Living status:Living  Apgars:9 ,9  Weight:2880 g   Magnesium Sulfate received: No BMZ received: No Rhophylac:N/A MMR:N/A T-DaP:Given prenatally Flu: No Transfusion:No  Physical exam  Vitals:   02/18/20 0732 02/18/20 0805 02/18/20 0816 02/18/20 0831  BP: 110/71 117/74 103/61 104/73  Pulse: 69 72 77 70  Resp: (!) _0 Temp: 97.6 F (36.4 C) 98 F (36.7 C)    TempSrc:  Oral    SpO2: 98%      General: alert, cooperative and no distress Lochia:  appropriate Uterine Fundus: firm Incision: Healing well with no significant drainage, No significant erythema, Dressing is clean, dry, and intact DVT Evaluation: No evidence of DVT seen on physical exam. Labs: Lab Results  Component Value Date   WBC 11.2 (H) 12/20/2019   HGB 10.5 (L) 12/20/2019   HCT 30.8 (L) 12/20/2019   MCV 84 12/20/2019   PLT 133 (L) 12/20/2019   CMP Latest Ref Rng & Units 12/19/2019  Glucose 70 - 99 mg/dL 73  BUN 6 - 20 mg/dL 6  Creatinine 0.44 - 1.00 mg/dL 0.72  Sodium 135 - 145 mmol/L 136  Potassium 3.5 - 5.1 mmol/L 3.7  Chloride 98 - 111 mmol/L 106  CO2 22 - 32 mmol/L 20(L)  Calcium 8.9 - 10.3 mg/dL 8.9  Total Protein 6.5 - 8.1 g/dL 5.7(L)  Total Bilirubin 0.3 - 1.2 mg/dL 0.7  Alkaline Phos 38 - 126 U/L 130(H)  AST 15 - 41 U/L 16  ALT 0 - 44 U/L 17   Edinburgh Score: Edinburgh Postnatal Depression Scale Screening Tool 09/19/2018  I have been able to laugh and see the funny side of things. 0  I have looked forward with enjoyment to things. 0  I have blamed myself unnecessarily when things went wrong. 0  I have been anxious or worried for no good reason. 0  I have felt scared or panicky for no good reason. 0  Things have been getting on top of me. 0  I have been so unhappy that I have had difficulty sleeping. 0  I have felt sad or miserable. 0  I have been so unhappy that I have been crying. 0  The thought of harming myself has occurred to me. 0  Edinburgh Postnatal Depression Scale Total 0     After visit meds:  Allergies as of 02/19/2020   No Known Allergies     Medication List    STOP taking these medications   Comfort Fit Maternity Supp Sm Misc   cyclobenzaprine 10 MG tablet Commonly known as: FLEXERIL   NIFEdipine 10 MG capsule Commonly known as: Procardia     TAKE these medications   acetaminophen 325 MG tablet Commonly known as: Tylenol Take 2 tablets (650 mg total) by mouth every 6 (six) hours as needed (for pain scale <  4). What changed:   when to take this  reasons to take this   ibuprofen 600 MG tablet Commonly known as: ADVIL Take 1 tablet (600 mg total) by mouth every 8 (eight) hours as needed for mild pain.   oxyCODONE 5 MG immediate release tablet Commonly known as: Oxy IR/ROXICODONE Take 1 tablet (5 mg total) by mouth every 4 (four) hours as needed for severe pain or breakthrough pain.   Prenatal Vitamins 0.8 MG tablet Take 1 tablet by mouth daily.        Discharge home in stable condition Infant Feeding: Breast Infant Disposition:home with mother Discharge instruction: per After Visit Summary and Postpartum booklet. Activity: Advance as tolerated. Pelvic rest for 6 weeks.  Diet: routine diet Future Appointments: Future Appointments  Date Time Provider [place]  02/21/2020  3:45 PM [facility]URSE [facility]  02/21/2020  3:45 PM [facility] US4 [facility] [place]   Follow up Visit:   Please schedule this patient for a Virtual postpartum visit in 4 weeks with the following provider: Any provider. Additional Postpartum F/U:None  Low risk pregnancy complicated by: FGR Delivery mode:  Vaginal, Spontaneous  Anticipated Birth Control: BTL  Lenna Sciara, MD PGY-2 Resident Family Medicine 02/19/2020, 7:40 AM   CNM attestation I have seen and examined this patient and agree with above documentation in the resident's note.   Shelby Serrano is a 24 y.o. [phone] s/p vag del & ppBTL.   Pain is well controlled.  Plan for birth control is bilateral tubal ligation.  Method of Feeding: breast  PE:  BP 90/67 (BP Location: Left Arm)   Pulse 60   Temp 97.7 F (36.5 C) (Oral)   Resp 15   LMP 04/17/2019   SpO2 99%   Breastfeeding Unknown  Fundus firm  Recent Labs    02/18/20 0729  HGB 11.8*  HCT 37.0     Plan: discharge today - postpartum care discussed - f/u clinic in 4 weeks for postpartum visit   Myrtis Ser, CNM 8:15 AM  02/19/2020

## 2020-02-18 NOTE — Anesthesia Preprocedure Evaluation (Signed)
Anesthesia Evaluation  Patient identified by MRN, date of birth, ID band Patient awake    Reviewed: Allergy & Precautions, NPO status , Patient's Chart, lab work & pertinent test results  Airway Mallampati: II  TM Distance: >3 FB Neck ROM: Full    Dental  (+) Teeth Intact, Dental Advisory Given   Pulmonary former smoker,    Pulmonary exam normal breath sounds clear to auscultation       Cardiovascular negative cardio ROS Normal cardiovascular exam Rhythm:Regular Rate:Normal     Neuro/Psych negative neurological ROS     GI/Hepatic negative GI ROS, Neg liver ROS,   Endo/Other  negative endocrine ROS  Renal/GU negative Renal ROS     Musculoskeletal negative musculoskeletal ROS (+)   Abdominal   Peds  Hematology  (+) Blood dyscrasia, Sickle cell trait and anemia , Plt 181k   Anesthesia Other Findings Day of surgery medications reviewed with the patient.  Reproductive/Obstetrics S/p SVD 02/18/20                             Anesthesia Physical Anesthesia Plan  ASA: II  Anesthesia Plan: Spinal   Post-op Pain Management:    Induction:   PONV Risk Score and Plan: 2 and Ondansetron and Treatment may vary due to age or medical condition  Airway Management Planned: Natural Airway  Additional Equipment:   Intra-op Plan:   Post-operative Plan:   Informed Consent: I have reviewed the patients History and Physical, chart, labs and discussed the procedure including the risks, benefits and alternatives for the proposed anesthesia with the patient or authorized representative who has indicated his/her understanding and acceptance.     Dental advisory given  Plan Discussed with: CRNA, Anesthesiologist and Surgeon  Anesthesia Plan Comments: (Discussed risks and benefits of and differences between spinal and general. Discussed risks of spinal including headache, backache, failure, bleeding,  infection, and nerve damage. Patient consents to spinal. Questions answered. Coagulation studies and platelet count acceptable.)        Anesthesia Quick Evaluation

## 2020-02-18 NOTE — Anesthesia Procedure Notes (Signed)
Spinal  Patient location during procedure: OR Start time: 02/18/2020 11:37 AM End time: 02/18/2020 11:40 AM Staffing Performed: anesthesiologist  Anesthesiologist: Cecile Hearing, MD Preanesthetic Checklist Completed: patient identified, IV checked, risks and benefits discussed, surgical consent, monitors and equipment checked, pre-op evaluation and timeout performed Spinal Block Patient position: sitting Prep: DuraPrep and site prepped and draped Patient monitoring: continuous pulse ox and blood pressure Approach: midline Location: L3-4 Injection technique: single-shot Needle Needle type: Pencan  Needle gauge: 24 G Assessment Sensory level: T8 Additional Notes Functioning IV was confirmed and monitors were applied. Sterile prep and drape, including hand hygiene, mask and sterile gloves were used. The patient was positioned and the spine was prepped. The skin was anesthetized with lidocaine.  Free flow of clear CSF was obtained prior to injecting local anesthetic into the CSF.  The spinal needle aspirated freely following injection.  The needle was carefully withdrawn.  The patient tolerated the procedure well. Consent was obtained prior to procedure with all questions answered and concerns addressed. Risks including but not limited to bleeding, infection, nerve damage, paralysis, failed block, inadequate analgesia, allergic reaction, high spinal, itching and headache were discussed and the patient wished to proceed.   Arrie Aran, MD

## 2020-02-18 NOTE — Op Note (Signed)
Operative Note   02/18/2020  PRE-OP DIAGNOSIS: Desire for permanent sterilization.  Postpartum Day #0   POST-OP DIAGNOSIS: Same  SURGEON: Surgeon(s) and Role:    * Wilson Bing, MD - Primary  ASSISTANT: None  ANESTHESIA: Spinal and local  PROCEDURE: mini-laparotomy, bilateral tubal ligation via Filshie Clips method  ESTIMATED BLOOD LOSS: 57mL  DRAINS: None  TOTAL IV FLUIDS: per anesthesia note  SPECIMENS: None  VTE PROPHYLAXIS: SCDs to the bilateral lower extremities  ANTIBIOTICS: not indicated  COMPLICATIONS: none  DISPOSITION: PACU - hemodynamically stable.  CONDITION: stable  FINDINGS: No intra-abdominal adhesions noted. Smooth, normally contoured uterine fundus and bilateral tubes. Normal appearing tubes and normal ovaries on palpation.  PROCEDURE IN DETAIL: The patient was taken to the OR where anesthesia was administed. The patient was positioned in dorsal supine. The patient was prepped and draped in the normal sterile fashion a 3-4cm horizontal incision was made at the infraumbilical fold after injection with local anesthesia. The skin was then incised with the scalpel and the underlying tissue dissected with the bovie and the fascia nicked in the midline with the scalpel and then extended laterally sharply.  The abdomen was then entered bluntly.   The left Fallopian tube was identified by tracing out to the fimbraie, grasped with the Babcock clamps. An avascular midsection of the tube approximately 3-4cm from the cornua was grasped and the filshie clip was applied, taking care to incorporate the entire tube.  Attention was then turned to the right fallopian tube after confirmation by tracing the tube out to the fimbriae. The same procedure was then performed on the right Fallopian tube, with excellent hemostasis was noted from both BTL sites.   The fascia closed in running fashion with 0 vicryl. The skin was then closed with 4-0 vicryl   The patient tolerated the  procedure well. All counts were correct x 2. The patient was transferred to the recovery room awake, alert and breathing independently.   Cornelia Copa MD Attending Center for [employer] [facility])

## 2020-02-18 NOTE — MAU Note (Signed)
Pt brought from lobby to rm, assisted to bed with changing clothes.  4th baby, to be induced for IUGR, denies other problems with preg.  Intact.  Contracting.

## 2020-02-18 NOTE — H&P (Signed)
OBSTETRIC ADMISSION HISTORY AND PHYSICAL  Allora Bains is a 24 y.o. female [phone] with IUP at [redacted]w[redacted]d by 3rd trimester [place] presenting for SOL. Reports ctx starting around 0100-0200. She reports +FMs, No LOF, no VB, no blurry vision, headaches or peripheral edema, and RUQ pain.  She plans on breast feeding. She requests BTL for birth control. She received her prenatal care at [facility]  Dating: By third trimester [place] --->  Estimated Date of Delivery: 03/03/20  Sono: 5/20    @[redacted]w[redacted]d , CWD, normal anatomy, cephalic presentation, posterior placental lie, 2193g, 7% EFW  Prenatal History/Complications: Dating by 3rd trimester [birth year]08-1976 with dates changed from LMP FGR Late [facility]  Past Medical History: Past Medical History:  Diagnosis Date  . Sickle cell trait (HCC)     Past Surgical History: History reviewed. No pertinent surgical history.  Obstetrical History: OB History    Gravida  7   Para  3   Term  3   Preterm  0   AB  3   Living  3     SAB  3   TAB  0   Ectopic  0   Multiple  0   Live Births  3           Social History: Social History   Socioeconomic History  . Marital status: Single    Spouse name: Not on file  . Number of children: Not on file  . Years of education: Not on file  . Highest education level: Not on file  Occupational History  . Not on file  Tobacco Use  . Smoking status: Former Smoker    Packs/day: 1.00    Types: Cigarettes    Quit date: 02/08/2016    Years since quitting: 4.0  . Smokeless tobacco: Never Used  Substance and Sexual Activity  . Alcohol use: No  . Drug use: Not Currently    Types: Marijuana    Comment: Patient denies, Positive UDS 05/04/16 [facility] ED  . Sexual activity: Yes    Birth control/protection: Injection  Other Topics Concern  . Not on file  Social History Narrative  . Not on file   Social Determinants of Health   Financial Resource Strain:   . Difficulty of Paying Living Expenses:   Food Insecurity: No Food  Insecurity  . Worried About [facility] in the Last Year: Never true  . Ran Out of Food in the Last Year: Never true  Transportation Needs: No Transportation Needs  . Lack of Transportation (Medical): No  . Lack of Transportation (Non-Medical): No  Physical Activity:   . Days of Exercise per Week:   . Minutes of Exercise per Session:   Stress:   . Feeling of Stress :   Social Connections:   . Frequency of Communication with Friends and Family:   . Frequency of Social Gatherings with Friends and Family:   . Attends Religious Services:   . Active Member of Clubs or Organizations:   . Attends Programme researcher, broadcasting/film/video Meetings:   Banker Marital Status:     Family History: Family History  Problem Relation Age of Onset  . Diabetes Mother   . Hypertension Mother     Allergies: No Known Allergies  Medications Prior to Admission  Medication Sig Dispense Refill Last Dose  . acetaminophen (TYLENOL) 325 MG tablet Take 2 tablets (650 mg total) by mouth every 4 (four) hours as needed. 100 tablet 2   . cyclobenzaprine (FLEXERIL) 10 MG tablet Take  1 tablet (10 mg total) by mouth 2 (two) times daily as needed for muscle spasms. 20 tablet 0   . Elastic Bandages & Supports (COMFORT FIT MATERNITY SUPP SM) MISC 1 Units by Does not apply route daily as needed. 1 each 0   . NIFEdipine (PROCARDIA) 10 MG capsule Take 1 capsule (10 mg total) by mouth every 6 (six) hours as needed (preterm contractions). (Patient not taking: Reported on 02/15/2020) 20 capsule 0   . Prenatal Multivit-Min-Fe-FA (PRENATAL VITAMINS) 0.8 MG tablet Take 1 tablet by mouth daily. 30 tablet 12 Unknown at Unknown time     Review of Systems   All systems reviewed and negative except as stated in HPI  Blood pressure 104/73, pulse 70, temperature 98 F (36.7 C), temperature source Oral, resp. rate 18, last menstrual period 04/17/2019, SpO2 98 %, not currently breastfeeding. General appearance: alert, cooperative and appears  stated age Lungs: normal effort Heart: regular rate  Abdomen: soft, non-tender; bowel sounds normal Pelvic: gravid uterus GU: No vaginal lesions  Extremities: Homans sign is negative, no sign of DVT Presentation: cephalic Fetal monitoringBaseline: 125 bpm, Variability: Good {> 6 bpm), Accelerations: Reactive and Decelerations: Absent Uterine activity: Frequency: Every 2 minutes Dilation: 10(Simultaneous filing. User may not have seen previous data.) Effacement (%): 100 Station: Plus 2 Exam by:: Dr. Jettie Booze filing. User may not have seen previous data.)   Prenatal labs: ABO, Rh: --/--/O POS (06/07 0254) Antibody: NEG (06/07 0729) Rubella: 1.90 (04/08 1705) RPR: Non Reactive (04/08 1705)  HBsAg: Negative (04/08 1705)  HIV: Non Reactive (04/08 1705)  GBS: Negative/-- (05/24 1544)  2 hr Glucola normal Genetic screening  Low risk Anatomy US normal  Prenatal Transfer Tool  Maternal Diabetes: No Genetic Screening: Normal Maternal Ultrasounds/Referrals: IUGR Fetal Ultrasounds or other Referrals:  None Maternal Substance Abuse:  No Significant Maternal Medications:  None Significant Maternal Lab Results: Group B Strep negative  Results for orders placed or performed during the hospital encounter of 02/18/20 (from the past 24 hour(s))  CBC   Collection Time: 02/18/20  7:29 AM  Result Value Ref Range   WBC 15.2 (H) 4.0 - 10.5 K/uL   RBC 4.41 3.87 - 5.11 MIL/uL   Hemoglobin 11.8 (L) 12.0 - 15.0 g/dL   HCT 27.0 62.3 - 76.2 %   MCV 83.9 80.0 - 100.0 fL   MCH 26.8 26.0 - 34.0 pg   MCHC 31.9 30.0 - 36.0 g/dL   RDW 83.1 51.7 - 61.6 %   Platelets 181 150 - 400 K/uL   nRBC 0.0 0.0 - 0.2 %  Type and screen   Collection Time: 02/18/20  7:29 AM  Result Value Ref Range   ABO/RH(D) O POS    Antibody Screen NEG    Sample Expiration      02/21/2020,2359 Performed at [facility], [address] [place], [place] [address]     Patient Active Problem List    Diagnosis Date Noted  . Unwanted fertility 02/18/2020  . Pregnancy affected by fetal growth restriction 01/31/2020  . Supervision of high risk pregnancy, antepartum 12/20/2019  . Late prenatal care affecting pregnancy in third trimester 12/19/2019  . Sciatic nerve pain 07/27/2018  . Sickle cell trait (HCC) 02/15/2018    Assessment/Plan:  Rolene Andrades is a 24 y.o. [phone] at [redacted]w[redacted]d here for SOL.  #Labor: Vertex by RN exam. Active labor. Anticipate AROM and quick delivery.  #Pain: Per patient request #FWB: Cat I; EFW: 2700g #ID:  GBS neg #  MOF: Breast #MOC: BTL vs IUD #Circ:  NA; girl  Barrington Ellison, MD [facility] Family Medicine Fellow, [facility], [facility] 02/18/2020, 8:47 AM

## 2020-02-18 NOTE — Progress Notes (Signed)
OB Note Patient wanting BTL. R/b/a d/w her particularly risk of regret and higher chance of failure both due to her age. I also d/w her to consider it permanent and no effect on menstrual cycle and she can always think about it and wait and do another method or do an interval BTL. Pt still wanting to BTL. I told her to stay NPO and plan is for late morning BTL today.  Cornelia Copa MD Attending Center for [employer] (Faculty Practice) 02/18/2020 Time: [phone]

## 2020-02-18 NOTE — MAU Note (Signed)
Patient presents in severe pain from contractions.  Denies LOF.  States she has been contracting all night.  Some bloody show.  Supposed to be induced tomorrow for SGA

## 2020-02-19 ENCOUNTER — Encounter: Payer: Self-pay | Admitting: Family Medicine

## 2020-02-19 ENCOUNTER — Other Ambulatory Visit: Payer: Self-pay | Admitting: Family Medicine

## 2020-02-19 ENCOUNTER — Inpatient Hospital Stay (HOSPITAL_COMMUNITY)
Admission: AD | Admit: 2020-02-19 | Payer: Medicaid Other | Source: Home / Self Care | Admitting: Obstetrics and Gynecology

## 2020-02-19 ENCOUNTER — Inpatient Hospital Stay (HOSPITAL_COMMUNITY): Payer: Medicaid Other

## 2020-02-19 ENCOUNTER — Encounter: Payer: Self-pay | Admitting: Obstetrics & Gynecology

## 2020-02-19 MED ORDER — OXYCODONE HCL 5 MG PO TABS: 5.0000 mg | ORAL_TABLET | ORAL | 0 refills | Status: DC | PRN

## 2020-02-19 MED ORDER — IBUPROFEN 600 MG PO TABS: 600.0000 mg | ORAL_TABLET | Freq: Three times a day (TID) | ORAL | 0 refills | Status: DC | PRN

## 2020-02-19 MED ORDER — ACETAMINOPHEN 325 MG PO TABS: 650.0000 mg | ORAL_TABLET | Freq: Four times a day (QID) | ORAL | 0 refills | Status: DC | PRN

## 2020-02-19 NOTE — Discharge Instructions (Signed)
Laparoscopic Tubal Ligation, Care After This sheet gives you information about how to care for yourself after your procedure. Your health care provider may also give you more specific instructions. If you have problems or questions, contact your health care provider. What can I expect after the procedure? After the procedure, it is common to have:  A sore throat.  Discomfort in your shoulder.  Mild discomfort or cramping in your abdomen.  Gas pains.  Pain or soreness in the area where the surgical incision was made.  A bloated feeling.  Tiredness.  Nausea.  Vomiting. Follow these instructions at home: Medicines  Take over-the-counter and prescription medicines only as told by your health care provider.  Do not take aspirin because it can cause bleeding.  Ask your health care provider if the medicine prescribed to you: ? Requires you to avoid driving or using heavy machinery. ? Can cause constipation. You may need to take actions to prevent or treat constipation, such as:  Drink enough fluid to keep your urine pale yellow.  Take over-the-counter or prescription medicines.  Eat foods that are high in fiber, such as beans, whole grains, and fresh fruits and vegetables.  Limit foods that are high in fat and processed sugars, such as fried or sweet foods. Incision care      Follow instructions from your health care provider about how to take care of your incision. Make sure you: ? Wash your hands with soap and water before and after you change your bandage (dressing). If soap and water are not available, use hand sanitizer. ? Change your dressing as told by your health care provider. ? Leave stitches (sutures), skin glue, or adhesive strips in place. These skin closures may need to stay in place for 2 weeks or longer. If adhesive strip edges start to loosen and curl up, you may trim the loose edges. Do not remove adhesive strips completely unless your health care provider  tells you to do that.  Check your incision area every day for signs of infection. Check for: ? Redness, swelling, or pain. ? Fluid or blood. ? Warmth. ? Pus or a bad smell. Activity  Rest as told by your health care provider.  Avoid sitting for a long time without moving. Get up to take short walks every 1-2 hours. This is important to improve blood flow and breathing. Ask for help if you feel weak or unsteady.  Return to your normal activities as told by your health care provider. Ask your health care provider what activities are safe for you. General instructions  Do not take baths, swim, or use a hot tub until your health care provider approves. Ask your health care provider if you may take showers. You may only be allowed to take sponge baths.  Have someone help you with your daily household tasks for the first few days.  Keep all follow-up visits as told by your health care provider. This is important. Contact a health care provider if:  You have redness, swelling, or pain around your incision.  Your incision feels warm to the touch.  You have pus or a bad smell coming from your incision.  The edges of your incision break open after the sutures have been removed.  Your pain does not improve after 2-3 days.  You have a rash.  You repeatedly become dizzy or light-headed.  Your pain medicine is not helping. Get help right away if you:  Have a fever.  Faint.  Have increasing   pain in your abdomen.  Have severe pain in one or both of your shoulders.  Have fluid or blood coming from your sutures or from your vagina.  Have shortness of breath or difficulty breathing.  Have chest pain or leg pain.  Have ongoing nausea, vomiting, or diarrhea. Summary  After the procedure, it is common to have mild discomfort or cramping in your abdomen.  Take over-the-counter and prescription medicines only as told by your health care provider.  Watch for symptoms that should  prompt you to call your health care provider.  Keep all follow-up visits as told by your health care provider. This is important. This information is not intended to replace advice given to you by your health care provider. Make sure you discuss any questions you have with your health care provider. Document Revised: 02/06/2019 Document Reviewed: 07/25/2018 Elsevier Patient Education  2020 [place]. Postpartum Care After Vaginal Delivery This sheet gives you information about how to care for yourself from the time you deliver your baby to up to 6-12 weeks after delivery (postpartum period). Your health care provider may also give you more specific instructions. If you have problems or questions, contact your health care provider. Follow these instructions at home: Vaginal bleeding  It is normal to have vaginal bleeding (lochia) after delivery. Wear a sanitary pad for vaginal bleeding and discharge. ? During the first week after delivery, the amount and appearance of lochia is often similar to a menstrual period. ? Over the next few weeks, it will gradually decrease to a dry, yellow-brown discharge. ? For most women, lochia stops completely by 4-6 weeks after delivery. Vaginal bleeding can vary from woman to woman.  Change your sanitary pads frequently. Watch for any changes in your flow, such as: ? A sudden increase in volume. ? A change in color. ? Large blood clots.  If you pass a blood clot from your vagina, save it and call your health care provider to discuss. Do not flush blood clots down the toilet before talking with your health care provider.  Do not use tampons or douches until your health care provider says this is safe.  If you are not breastfeeding, your period should return 6-8 weeks after delivery. If you are feeding your child breast milk only (exclusive breastfeeding), your period may not return until you stop breastfeeding. Perineal care  Keep the area between the  vagina and the anus (perineum) clean and dry as told by your health care provider. Use medicated pads and pain-relieving sprays and creams as directed.  If you had a cut in the perineum (episiotomy) or a tear in the vagina, check the area for signs of infection until you are healed. Check for: ? More redness, swelling, or pain. ? Fluid or blood coming from the cut or tear. ? Warmth. ? Pus or a bad smell.  You may be given a squirt bottle to use instead of wiping to clean the perineum area after you go to the bathroom. As you start healing, you may use the squirt bottle before wiping yourself. Make sure to wipe gently.  To relieve pain caused by an episiotomy, a tear in the vagina, or swollen veins in the anus (hemorrhoids), try taking a warm sitz bath 2-3 times a day. A sitz bath is a warm water bath that is taken while you are sitting down. The water should only come up to your hips and should cover your buttocks. Breast care  Within the first few  days after delivery, your breasts may feel heavy, full, and uncomfortable (breast engorgement). Milk may also leak from your breasts. Your health care provider can suggest ways to help relieve the discomfort. Breast engorgement should go away within a few days.  If you are breastfeeding: ? Wear a bra that supports your breasts and fits you well. ? Keep your nipples clean and dry. Apply creams and ointments as told by your health care provider. ? You may need to use breast pads to absorb milk that leaks from your breasts. ? You may have uterine contractions every time you breastfeed for up to several weeks after delivery. Uterine contractions help your uterus return to its normal size. ? If you have any problems with breastfeeding, work with your health care provider or Science writer.  If you are not breastfeeding: ? Avoid touching your breasts a lot. Doing this can make your breasts produce more milk. ? Wear a good-fitting bra and use cold  packs to help with swelling. ? Do not squeeze out (express) milk. This causes you to make more milk. Intimacy and sexuality  Ask your health care provider when you can engage in sexual activity. This may depend on: ? Your risk of infection. ? How fast you are healing. ? Your comfort and desire to engage in sexual activity.  You are able to get pregnant after delivery, even if you have not had your period. If desired, talk with your health care provider about methods of birth control (contraception). Medicines  Take over-the-counter and prescription medicines only as told by your health care provider.  If you were prescribed an antibiotic medicine, take it as told by your health care provider. Do not stop taking the antibiotic even if you start to feel better. Activity  Gradually return to your normal activities as told by your health care provider. Ask your health care provider what activities are safe for you.  Rest as much as possible. Try to rest or take a nap while your baby is sleeping. Eating and drinking   Drink enough fluid to keep your urine pale yellow.  Eat high-fiber foods every day. These may help prevent or relieve constipation. High-fiber foods include: ? Whole grain cereals and breads. ? Brown rice. ? Beans. ? Fresh fruits and vegetables.  Do not try to lose weight quickly by cutting back on calories.  Take your prenatal vitamins until your postpartum checkup or until your health care provider tells you it is okay to stop. Lifestyle  Do not use any products that contain nicotine or tobacco, such as cigarettes and e-cigarettes. If you need help quitting, ask your health care provider.  Do not drink alcohol, especially if you are breastfeeding. General instructions  Keep all follow-up visits for you and your baby as told by your health care provider. Most women visit their health care provider for a postpartum checkup within the first 3-6 weeks after  delivery. Contact a health care provider if:  You feel unable to cope with the changes that your child brings to your life, and these feelings do not go away.  You feel unusually sad or worried.  Your breasts become red, painful, or hard.  You have a fever.  You have trouble holding urine or keeping urine from leaking.  You have little or no interest in activities you used to enjoy.  You have not breastfed at all and you have not had a menstrual period for 12 weeks after delivery.  You have stopped  breastfeeding and you have not had a menstrual period for 12 weeks after you stopped breastfeeding.  You have questions about caring for yourself or your baby.  You pass a blood clot from your vagina. Get help right away if:  You have chest pain.  You have difficulty breathing.  You have sudden, severe leg pain.  You have severe pain or cramping in your lower abdomen.  You bleed from your vagina so much that you fill more than one sanitary pad in one hour. Bleeding should not be heavier than your heaviest period.  You develop a severe headache.  You faint.  You have blurred vision or spots in your vision.  You have bad-smelling vaginal discharge.  You have thoughts about hurting yourself or your baby. If you ever feel like you may hurt yourself or others, or have thoughts about taking your own life, get help right away. You can go to the nearest emergency department or call:  Your local emergency services (911 in the [place]  A suicide crisis helpline, such as the National Suicide Prevention Lifeline at 1-800-273-8255. This is open 24 hours a day. Summary  The period of time right after you deliver your newborn up to 6-12 weeks after delivery is called the postpartum period.  Gradually return to your normal activities as told by your health care provider.  Keep all follow-up visits for you and your baby as told by your health care provider. This information is not  intended to replace advice given to you by your health care provider. Make sure you discuss any questions you have with your health care provider. Document Revised: 09/02/2017 Document Reviewed: 06/13/2017 Elsevier Patient Education  2020 [employer] Inc[employer]  

## 2020-02-19 NOTE — Clinical Social Work Maternal (Addendum)
CLINICAL SOCIAL WORK MATERNAL/CHILD NOTE  Patient Details  Name: Shelby Serrano MRN: [id number] Date of Birth: 09/05/1996  Date:  02/19/2020  Clinical Social Worker Initiating Note:  Kristie Bracewell, LCSW Date/Time: Initiated:  02/19/20/0900     Child's Name:  Shelby Serrano   Biological Parents:  Mother, Father(Shelby Serrano, Shelby Serrano)   Need for Interpreter:  None   Reason for Referral:  Late or No Prenatal Care    Address:  [address] [place] [address]    Phone number:  [phone] (home)     Additional phone number: none   Household Members/Support Persons (HM/SP):   Household Member/Support Person 1, Household Member/Support Person 2   HM/SP Name Relationship DOB or Age  HM/SP -1 Natividad Orihuela MOB  07/18/1996  HM/SP -2 Shelby Serrano  son   08/17/2018  HM/SP -3   Shelby Serrano  FOB     HM/SP -4   Shelby Serrano  daughter   07/15/2016  HM/SP -5        HM/SP -6        HM/SP -7        HM/SP -8          Natural Supports (not living in the home):  Extended Family   Professional Supports: None   Employment: Unemployed   Type of Work: none   Education:  9 to 11 years   Homebound arranged: No  Financial Resources:  Medicaid   Other Resources:  WIC, Food Stamps    Cultural/Religious Considerations Which May Impact Care:  none reported to this CSW.   Strengths:  Ability to meet basic needs , Compliance with medical plan , Home prepared for child , Pediatrician chosen   Psychotropic Medications:       None reported.  Pediatrician:    [place] area  Pediatrician List:   [facility]  [place]    [place]    [place]    [place]    [place]      Pediatrician Fax Number:    Risk Factors/Current Problems:  None   Cognitive State:  Insightful , Able to Concentrate , Alert    Mood/Affect:  Interested , Happy , Comfortable , Calm , Relaxed    CSW Assessment: CSW  consulted as MOB had care starting at 29 weeks. CSW went to speak with MOB at bedside to address further needs/concerns.   CSW entered room and congratulated MOB on the birth of infant. CSW advised MOB of CSW's role and the reason for CSW coming by to speak with her. MOB reported that she did start care at 29 weeks "due to the COVID. I was very cautious because I didn't want to get sick". CSW validated MOB's concerns and reported understanding this. CSW advised MOB of the hospital drug screen policy. CSW advised MOB that any time mom receives care after 28 weeks, it is policy that infants be screened for substances use. CSW advised MOB that infants UDS is negative, however CSW would need to monitor infant's CDS for further results and make CPS report if warranted. CSW asked MOB if she had any questions and MOB reported "well they didn't tell me, they just put the cotton balls in her pamper". CSW apologized to MOB for this but did confirm that MOB understood policy once CSW explained. MOB reported that she had CPS hx in [place] but reported that case was closed in 2018. MOB did not report   to CSW what the hx was for, however CSW aware that per note in 2017, MOB had other challenges with family that resulted in CPS involvement. MOB reported to this CSW that she had CPS hx in [place] with her oldest daughter in 2017 but reported that the case was closed as "it was only for marijuana use". CSW understanding and followed up with P. Miller from [place] CPS and was advised that MOB doesn't have any open cases as of 02/19/20. At this time CSW has no safety concerns to make new CPS report and all previous cases have been closed or screedout.   CSW inquired from MOB on he mental health hx. 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 MOB reported that she was never placed on medications or in therapy. MOB was offered resources for possible mental health, and MOB declined at  this time. CSW was updated by MOB that she isn't feeling SI or HI and reported no other needs to this CSW. MOB reported that she has been feeling fine since giving birth.   CSW took time to provide MOB with PPD and SIDS education. MOB was given PPD Checklist in order to keep track of feelings as they relate to PPD. MOB reported that she has all needed items to care for infant with no other needs.   CSW will continue to monitor infants CDS and make CPS report if warranted. At this time, from CSW's standpoint there are no barriers/concerns with infant discharging to MOB.    CSW Plan/Description:  Sudden Infant Death Syndrome (SIDS) Education, Perinatal Mood and Anxiety Disorder (PMADs) Education, CSW Will Continue to Monitor Umbilical Cord Tissue Drug Screen Results and Make Report if Warranted, Hospital Drug Screen Policy Information    Miyu Fenderson S Eufelia Veno, LCSWA 02/19/2020, 9:20 AM  

## 2020-02-19 NOTE — Progress Notes (Signed)
Prescription from discharge not signed. Sent prescription electronically.

## 2020-02-21 ENCOUNTER — Encounter: Payer: Medicaid Other | Admitting: Family Medicine

## 2020-02-21 ENCOUNTER — Ambulatory Visit: Payer: Medicaid Other

## 2020-03-18 ENCOUNTER — Encounter: Payer: Self-pay | Admitting: Nurse Practitioner

## 2020-03-18 ENCOUNTER — Other Ambulatory Visit: Payer: Self-pay

## 2020-03-18 ENCOUNTER — Telehealth (INDEPENDENT_AMBULATORY_CARE_PROVIDER_SITE_OTHER): Payer: Medicaid Other | Admitting: Nurse Practitioner

## 2020-03-18 DIAGNOSIS — M5430 Sciatica, unspecified side: Secondary | ICD-10-CM | POA: Diagnosis not present

## 2020-03-18 DIAGNOSIS — O99891 Other specified diseases and conditions complicating pregnancy: Secondary | ICD-10-CM

## 2020-03-18 NOTE — Progress Notes (Signed)
I connected with@ on 03/18/20 at  2:15 PM EDT by: Mychart video and verified that I am speaking with the correct person using two identifiers.  Patient is located at home and provider is located at [employer] for [employer] [facility] [facility] .     The purpose of this virtual visit is to provide medical care while limiting exposure to the novel coronavirus. I discussed the limitations, risks, security and privacy concerns of performing an evaluation and management service by Mychart and the availability of in person appointments. I also discussed with the patient that there may be a patient responsible charge related to this service. By engaging in this virtual visit, you consent to the provision of healthcare.  Additionally, you authorize for your insurance to be billed for the services provided during this visit.  The patient expressed understanding and agreed to proceed.  The following staff members participated in the virtual visit:  Marylynn Pearson, RN and Nolene Bernheim, NP  Post Partum Visit Note Subjective:   Shelby Serrano is a 24 y.o. [phone] female being evaluated for postpartum followup.  She is 4 weeks postpartum following a normal spontaneous vaginal delivery and tubal ligation was performed at  38 gestational weeks.  I have fully reviewed the prenatal and intrapartum course; pregnancy complicated by IUGR.  Postpartum course has been good. Baby is doing well. Baby is feeding by bottle - Carnation Good Start. Bleeding no bleeding. Bowel function is normal. Bladder function is normal. Patient is not sexually active. Contraception method is tubal ligation. Postpartum depression screening: negative.  The following portions of the patient's history were reviewed and updated as appropriate: allergies, past family history, past medical history, past social history, past surgical history and problem list.  Having difficulty with sciatic hip and back pain that comes and goes -  sometimes left side, sometimes right side and sometimes both sides together.  Has not yet started back to work and asks to extend her maternity leave to July 27.  Requested admin staff to send letter to her confirming July 27 as day to return to work.  Still needs to pick up her work leave papers.  Has misplaced her BP cuff and not able to take BP today.  Review of Systems Pertinent items noted in HPI and remainder of comprehensive ROS otherwise negative.   Objective:  There were no vitals filed for this visit. Self-Obtained       Assessment:    Normal postpartum exam with sciatia pain Referred to physical therapy in the office .  Plan:  Essential components of care per ACOG recommendations:  1.  Mood and well being: Patient with negative depression screening today. Reviewed local resources for support.  - Patient does not use tobacco. - hx of drug use? No   2. Infant care and feeding:  -Patient currently breastmilk feeding? No -Social determinants of health (SDOH) reviewed in EPIC. No concerns  3. Sexuality, contraception and birth spacing - Patient does not want a pregnancy in the next year.   Had BTL  4. Sleep and fatigue -Encouraged family/partner/community support of 4 hrs of uninterrupted sleep to help with mood and fatigue  5. Physical Recovery  - Discussed patients delivery - Patient had no lacerations, perineal healing reviewed. Patient expressed understanding - Patient has urinary incontinence? No  - Patient is safe to resume physical and sexual activity  6.  Health Maintenance January 2019. Last pap smear and was normal  Advised to  return in January for annual exam and have pap smear done   10 minutes of non-face-to-face time spent with the patient    Marylynn Pearson, [employer] for [employer], [facility]  Nolene Bernheim, RN, MSN, NP-BC Nurse Practitioner, Biochemist, clinical for [employer], [facility] 03/18/2020 2:39 PM

## 2020-05-13 ENCOUNTER — Encounter: Payer: Medicaid Other | Attending: Nurse Practitioner | Admitting: Physical Therapy

## 2020-05-13 ENCOUNTER — Other Ambulatory Visit: Payer: Self-pay

## 2020-05-13 ENCOUNTER — Encounter: Payer: Self-pay | Admitting: Physical Therapy

## 2020-05-13 DIAGNOSIS — M5441 Lumbago with sciatica, right side: Secondary | ICD-10-CM | POA: Insufficient documentation

## 2020-05-13 DIAGNOSIS — M6281 Muscle weakness (generalized): Secondary | ICD-10-CM | POA: Diagnosis present

## 2020-05-13 DIAGNOSIS — G8929 Other chronic pain: Secondary | ICD-10-CM | POA: Insufficient documentation

## 2020-05-13 NOTE — Therapy (Signed)
[facility] [address] [place], [place], [address] Phone: [phone]   Fax:  [phone]  Physical Therapy Evaluation  Patient Details  Name: Shelby Serrano MRN: [id number] Date of Birth: Jun 04, 1996 Referring Provider (PT): Nolene Bernheim, NP   Encounter Date: 05/13/2020   PT End of Session - 05/13/20 1606    Visit Number 1    Date for PT Re-Evaluation 08/05/20    Authorization Type Medicaid [place]    PT Start Time 1515    PT Stop Time 1555    PT Time Calculation (min) 40 min    Activity Tolerance Patient tolerated treatment well    Behavior During Therapy [facility] for tasks assessed/performed           Past Medical History:  Diagnosis Date  . Sickle cell trait [facility])     Past Surgical History:  Procedure Laterality Date  . TUBAL LIGATION Bilateral 02/18/2020   Procedure: POST PARTUM TUBAL LIGATION;  Surgeon: Woodmore Bing, MD;  Location: [facility] LD ORS;  Service: Gynecology;  Laterality: Bilateral;    There were no vitals filed for this visit.    Subjective Assessment - 05/13/20 1518    Subjective Patient reports her pain started during her third pregnancy in 2018. During the last  pregnancy the pain got worse.    Currently in Pain? Yes    Pain Score 10-Worst pain ever    Pain Location Back    Pain Orientation Right    Pain Descriptors / Indicators Throbbing;Sharp    Pain Type Chronic pain    Pain Radiating Towards goes down the right leg    Pain Onset More than a month ago    Pain Frequency Intermittent    Aggravating Factors  walking, laying on back or side, movement    Pain Relieving Factors massage, stay still    Multiple Pain Sites No              [facility] PT Assessment - 05/13/20 0001      Assessment   Medical Diagnosis M54.30 Sciatic nerve pain, unspecified laterality    Referring Provider (PT) Nolene Bernheim, NP    Onset Date/Surgical Date --   2018   Prior Therapy none       Precautions   Precautions None      Restrictions   Weight Bearing Restrictions No      Balance Screen   Has the patient fallen in the past 6 months No    Has the patient had a decrease in activity level because of a fear of falling?  No    Is the patient reluctant to leave their home because of a fear of falling?  No      Prior Function   Level of Independence Independent    Vocation Full time employment    Engineer, technical sales      Cognition   Overall Cognitive Status Within Functional Limits for tasks assessed      Posture/Postural Control   Posture/Postural Control No significant limitations      ROM / Strength   AROM / PROM / Strength AROM;PROM;Strength      AROM   Lumbar Extension rotate left    Lumbar - Left Side Bend decreased by 25%      Strength   Right Hip External Rotation  4/5    Right Hip ABduction 3+/5    Left Hip External Rotation 4/5    Left Hip ABduction 3+/5  Palpation   Spinal mobility L5 rotated left    SI assessment  ASIS equal and right is shoallow; sacrum rotated left    Palpation comment tenderness located in lower abdomen, bil. quadratus, right piriformis muscles      Special Tests    Special Tests Lumbar      Straight Leg Raise   Findings Positive    Side  Right    Comment pain in righ tleg at 50 degrees                      Objective measurements completed on examination: See above findings.     Pelvic Floor Special Questions - 05/13/20 0001    Prior Pregnancies Yes    Number of Vaginal Deliveries 4   8, 3, 1, 3 months   Any difficulty with labor and deliveries --   first one had tear   Diastasis Recti none    Urinary Leakage No    Fecal incontinence No            [facility] Adult PT Treatment/Exercise - 05/13/20 0001      Lumbar Exercises: Stretches   Piriformis Stretch Right;2 reps;10 seconds    Piriformis Stretch Limitations pushing on right knee    Other Lumbar Stretch Exercise supine with right  hip at 90 degrees and extend then flex knee to floss the nerve      Lumbar Exercises: Standing   Other Standing Lumbar Exercises standing with left upper body against the wall and push the right hip to the wall for contralizing pain, brought pain to the righ tknee from the ankle                  PT Education - 05/13/20 1606    Education Details Access Code: [email or web address]    Person(s) Educated Patient    Methods Explanation;Demonstration;Handout    Comprehension Returned demonstration;Verbalized understanding            PT Short Term Goals - 05/13/20 1622      PT SHORT TERM GOAL #1   Title independent with initial HEP    Time 4    Period Weeks    Status New    Target Date 06/10/20             PT Long Term Goals - 05/13/20 1622      PT LONG TERM GOAL #1   Title independent with advanced HEP for core stability and reduction of pain    Baseline not educated yet    Time 12    Period Weeks    Status New    Target Date 08/05/20      PT LONG TERM GOAL #2   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 of the pain    Baseline walking the pain goes down to her right ankle at level 10/10    Time 12    Period Weeks    Status New    Target Date 08/05/20      PT LONG TERM GOAL #3   Title able to sleep through the night without pain waking her up    Baseline wakes her up several times per night    Time 12    Period Weeks    Status New    Target Date 08/05/20      PT LONG TERM GOAL #4   Title bilateral hip  strength for abduction and ER >/= 4/5 so she is able to squat and pick up her 37 month old with right back and lower extremity pain at level </= 2/10    Baseline strength is 3+/5 and pain is 10/10    Time 12    Period Weeks    Status New    Target Date 08/05/20      PT LONG TERM GOAL #5   Title understand correct body mechanics with daily and work tasks to reduce the strain on her lumbar spine    Baseline  not educated yet    Time 12    Period Weeks    Status New    Target Date 08/05/20                  Plan - 05/13/20 1608    Clinical Impression Statement Patient is a 24 year old female with right sciatic pain since she had her third child in 2018. Patient reports her pain has increased since her 4th child on 02/18/1920. Patient reports her intermittent pain is 10/10 from her back down to her right ankle with movement and walking. Her lumbar extension is decreased by 25% and she rotates to the left. Positive straight leg raise on the right at 50 degrees. L5 is rotated left ans sacrum rotated right. She has tenderness located in lower abdomen, bilateral quadratus, and right piriformis. Bilatearl hip abduction and external rotation are weak. Patient will benefit from skilled therapy to reduce back and right leg and back pain to improve sleep, working and taking care of her kids.    Personal Factors and Comorbidities Profession    Examination-Activity Limitations Bed Mobility;Sleep;Stairs;Locomotion Level    Examination-Participation Restrictions Occupation;Community Activity    Stability/Clinical Decision Making Stable/Uncomplicated    Clinical Decision Making Low    Rehab Potential Excellent    PT Frequency 1x / week    PT Duration 12 weeks    PT Treatment/Interventions ADLs/Self Care Home Management;Cryotherapy;Electrical Stimulation;Moist Heat;Ultrasound;Therapeutic activities;Therapeutic exercise;Manual techniques;Patient/family education;Neuromuscular re-education;Dry needling;Spinal Manipulations    PT Next Visit Plan body mechanics for daily tasks; modalities as needed; abdominal bracing; soft tissue work to lumbar, piriformis, see if lateral shift is centralizing pain    PT Home Exercise Plan Access Code: [email or web address]    Consulted and Agree with Plan of Care Patient           Patient will benefit from skilled therapeutic intervention in order to improve the following deficits and  impairments:  Decreased activity tolerance, Decreased strength, Pain, Increased muscle spasms, Decreased range of motion  Visit Diagnosis: Muscle weakness (generalized) - Plan: PT plan of care cert/re-cert  Chronic right-sided low back pain with right-sided sciatica - Plan: PT plan of care cert/re-cert     Problem List Patient Active Problem List   Diagnosis Date Noted  . Sciatic nerve pain 07/27/2018  . Sickle cell trait (HCC) 02/15/2018    Eulis Foster, PT 05/13/20 4:29 PM   [facility] [address] [place], [place], [address] Phone: [phone]   Fax:  [phone]  Name: Chava Dulac MRN: [id number] Date of Birth: [birth year]

## 2020-05-13 NOTE — Patient Instructions (Signed)
Access Code: [email or web address] URL: [email or web address] Date: 05/13/2020 Prepared by: Eulis Foster  Exercises Supine Figure 4 Piriformis Stretch - 1 x daily - 7 x weekly - 1 sets - 2 reps Supine Sciatic Nerve Glide - 2 x daily - 7 x weekly - 1 sets - 5 reps Left Standing Lateral Shift Correction at Wall - Repetitions - 5 x daily - 7 x weekly - 1 sets - 1 reps - 30 sec hold [facility] [address] [place], [place] [address] Phone # [phone] Fax [phone]

## 2020-05-20 ENCOUNTER — Encounter: Payer: Medicaid Other | Attending: Nurse Practitioner | Admitting: Physical Therapy

## 2020-05-20 ENCOUNTER — Encounter: Payer: Self-pay | Admitting: Physical Therapy

## 2020-05-20 ENCOUNTER — Other Ambulatory Visit: Payer: Self-pay

## 2020-05-20 DIAGNOSIS — G8929 Other chronic pain: Secondary | ICD-10-CM | POA: Diagnosis present

## 2020-05-20 DIAGNOSIS — M6281 Muscle weakness (generalized): Secondary | ICD-10-CM | POA: Diagnosis not present

## 2020-05-20 DIAGNOSIS — M5441 Lumbago with sciatica, right side: Secondary | ICD-10-CM | POA: Diagnosis present

## 2020-05-20 NOTE — Therapy (Signed)
[facility] [address], [address] [place], [place], [address] Phone: [phone]   Fax:  [phone]  Physical Therapy Treatment  Patient Details  Name: Shelby Serrano MRN: [id number] Date of Birth: 28-Jun-1996 Referring Provider (PT): Nolene Bernheim, NP   Encounter Date: 05/20/2020   PT End of Session - 05/20/20 1556    Visit Number 2    Date for PT Re-Evaluation 08/05/20    Authorization Type Medicaid [place]    Authorization Time Period 05/20/2020-06/09/2020    Authorization - Visit Number 1    Authorization - Number of Visits 3    PT Start Time 1500    PT Stop Time 1545    PT Time Calculation (min) 45 min    Activity Tolerance Patient tolerated treatment well;No increased pain    Behavior During Therapy WFL for tasks assessed/performed           Past Medical History:  Diagnosis Date   Sickle cell trait (HCC)     Past Surgical History:  Procedure Laterality Date   TUBAL LIGATION Bilateral 02/18/2020   Procedure: POST PARTUM TUBAL LIGATION;  Surgeon: Letcher Bing, MD;  Location: [facility] LD [facility]S;  Service: Gynecology;  Laterality: Bilateral;    There were no vitals filed for this visit.   Subjective Assessment - 05/20/20 1505    Subjective the pain is the same.    Patient Stated Goals reduce pain    Currently in Pain? Yes    Pain Score 4     Pain Location Back    Pain Orientation Right    Pain Descriptors / Indicators Sharp;Aching    Pain Type Chronic pain    Pain Onset More than a month ago    Pain Frequency Intermittent    Aggravating Factors  walking, laying on back or side, movement    Pain Relieving Factors massage, stay still                             [facility] Adult PT Treatment/Exercise - 05/20/20 0001      Lumbar Exercises: Stretches   Piriformis Stretch Right;5 reps;30 seconds    Piriformis Stretch Limitations wit contract relax in supine      Lumbar Exercises: Standing    Other Standing Lumbar Exercises lateral shift with rihg tside on wall and therapist helping move the hips to the wall      Lumbar Exercises: Supine   Ab Set 15 reps;5 seconds    AB Set Limitations with ball between knees to engage the lower abdominals more      Lumbar Exercises: Prone   Other Prone Lumbar Exercises gluteal squeeze hold 5 sec 10x      Knee/Hip Exercises: Sidelying   Other Sidelying Knee/Hip Exercises sidly with right upper body rotation and abdominal contraction with tactile cues to contract the lower abdominals    Other Sidelying Knee/Hip Exercises right iliacus pull back 10x      Manual Therapy   Manual Therapy Joint mobilization;Soft tissue mobilization    Joint Mobilization rotational mobilization to L1-L5 in left sidely, sideglide to right side of L1-L5; righ thip mobilization for inferiror glide and distraction    Soft tissue mobilization urisng the addaday to the right piriformis, hamstring, and gluteal in left sidely; soft tissue work to the right quadratus, right diaphragm, psoas            Trigger Point Dry Needling - 05/20/20 0001  Consent Given? Yes    Education Handout Provided Yes    Muscles Treated Back/Hip Quadratus lumborum   right   Quadratus Lumborum Response Twitch response elicited;Palpable increased muscle length                PT Education - 05/20/20 1556    Education Details Access Code: [email or web address]    Person(s) Educated Patient    Methods Explanation;Demonstration;Verbal cues;Handout    Comprehension Returned demonstration;Verbalized understanding            PT Short Term Goals - 05/13/20 1622      PT SHORT TERM GOAL #1   Title independent with initial HEP    Time 4    Period Weeks    Status New    Target Date 06/10/20             PT Long Term Goals - 05/13/20 1622      PT LONG TERM GOAL #1   Title independent with advanced HEP for core stability and reduction of pain    Baseline not educated yet    Time 12     Period Weeks    Status New    Target Date 08/05/20      PT LONG TERM GOAL #2   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 of the pain    Baseline walking the pain goes down to her right ankle at level 10/10    Time 12    Period Weeks    Status New    Target Date 08/05/20      PT LONG TERM GOAL #3   Title able to sleep through the night without pain waking her up    Baseline wakes her up several times per night    Time 12    Period Weeks    Status New    Target Date 08/05/20      PT LONG TERM GOAL #4   Title bilateral hip strength for abduction and ER >/= 4/5 so she is able to squat and pick up her 20 month old with right back and lower extremity pain at level </= 2/10    Baseline strength is 3+/5 and pain is 10/10    Time 12    Period Weeks    Status New    Target Date 08/05/20      PT LONG TERM GOAL #5   Title understand correct body mechanics with daily and work tasks to reduce the strain on her lumbar spine    Baseline not educated yet    Time 12    Period Weeks    Status New    Target Date 08/05/20                 Plan - 05/20/20 1557    Clinical Impression Statement Patient has trigger points in the righ tquadratus, psoas, hamstring, piriformis, and quads. Patient pelvis was in correct alignment. Patient needed tactile cues to contract the lower abdominals. Patient was able to walk without a limp after therapy. Patient had a negative straight leg raise after therapy. Patient continues to require skilled therapy to reduce back and right leg and  back pain to improve sleep, working, and taking care of her kids.    Personal Factors and Comorbidities Profession    Examination-Activity Limitations Bed Mobility;Sleep;Stairs;Locomotion Level    Examination-Participation Restrictions Occupation;Community Activity    Stability/Clinical Decision Making Stable/Uncomplicated  Rehab Potential Excellent    PT  Frequency 1x / week    PT Duration 12 weeks    PT Treatment/Interventions ADLs/Self Care Home Management;Cryotherapy;Electrical Stimulation;Moist Heat;Ultrasound;Therapeutic activities;Therapeutic exercise;Manual techniques;Patient/family education;Neuromuscular re-education;Dry needling;Spinal Manipulations    PT Next Visit Plan body mechanics for daily tasks; assess dry needling, DN psoas and piriformis; abdominal bracing with leg and arm movement, lunges; soft tissue work to lumbar, piriformis,    PT Home Exercise Plan Access Code: [email or web address]    Recommended Other Services initial note signed    Consulted and Agree with Plan of Care Patient           Patient will benefit from skilled therapeutic intervention in order to improve the following deficits and impairments:  Decreased activity tolerance, Decreased strength, Pain, Increased muscle spasms, Decreased range of motion  Visit Diagnosis: Muscle weakness (generalized)  Chronic right-sided low back pain with right-sided sciatica     Problem List Patient Active Problem List   Diagnosis Date Noted   Sciatic nerve pain 07/27/2018   Sickle cell trait (HCC) 02/15/2018    Eulis Foster, PT 05/20/20 4:01 PM   [facility] [address] [place], [place], [address] Phone: [phone]   Fax:  [phone]  Name: Zamariya Neal MRN: [id number] Date of Birth: October 20, 1995

## 2020-05-20 NOTE — Patient Instructions (Signed)
Access Code: [email or web address] URL: [email or web address] Date: 05/20/2020 Prepared by: Eulis Foster  Exercises Supine Piriformis Stretch Pulling Heel to Hip - 1 x daily - 7 x weekly - 1 sets - 2 reps - 30 sec hold Supine Transversus Abdominis Bracing - Hands on Stomach - 1 x daily - 7 x weekly - 1 sets - 10 reps - 5 sec hold Prone Gluteal Sets - 1 x daily - 7 x weekly - 1 sets - 10 reps - 5 sec hold  Patient Education Trigger Point Dry Needling Eulis Foster, PT [facility] [address], [address] [place], [place] [address] W: [phone] [email or web address]

## 2020-05-27 ENCOUNTER — Other Ambulatory Visit: Payer: Self-pay

## 2020-05-27 ENCOUNTER — Encounter: Payer: Medicaid Other | Admitting: Physical Therapy

## 2020-05-27 ENCOUNTER — Encounter: Payer: Self-pay | Admitting: Physical Therapy

## 2020-05-27 DIAGNOSIS — M5441 Lumbago with sciatica, right side: Secondary | ICD-10-CM

## 2020-05-27 DIAGNOSIS — M6281 Muscle weakness (generalized): Secondary | ICD-10-CM

## 2020-05-27 NOTE — Patient Instructions (Signed)
Access Code: [email or web address] URL: [email or web address] Date: 05/27/2020 Prepared by: Eulis Foster  Exercises Standing Lumbar Extension - 1 x daily - 7 x weekly - 1 sets - 5 reps Left Standing Lateral Shift Correction at Wall - Hold - 1 x daily - 7 x weekly - 1 sets - 10 reps  Patient Education Trigger Point Dry Needling Eulis Foster, PT [facility] [address], [address] [place], [place] [address] W: [phone] [email or web address]

## 2020-05-27 NOTE — Therapy (Signed)
[facility] [address], [address] [place], [place], [address] Phone: [phone]   Fax:  [phone]  Physical Therapy Treatment  Patient Details  Name: Shelby Serrano MRN: [id number] Date of Birth: May 21, 1996 Referring Provider (PT): Nolene Bernheim, NP   Encounter Date: 05/27/2020   PT End of Session - 05/27/20 1522    Visit Number 3    Date for PT Re-Evaluation 08/05/20    Authorization Type Medicaid [place]    Authorization Time Period 05/20/2020-06/09/2020    Authorization - Visit Number 2    Authorization - Number of Visits 3    PT Start Time 1517    PT Stop Time 1555    PT Time Calculation (min) 38 min    Activity Tolerance Patient tolerated treatment well;No increased pain    Behavior During Therapy WFL for tasks assessed/performed           Past Medical History:  Diagnosis Date  . Sickle cell trait [facility])     Past Surgical History:  Procedure Laterality Date  . TUBAL LIGATION Bilateral 02/18/2020   Procedure: POST PARTUM TUBAL LIGATION;  Surgeon: [place] Bing, MD;  Location: [facility] LD [facility]S;  Service: Gynecology;  Laterality: Bilateral;    There were no vitals filed for this visit.   Subjective Assessment - 05/27/20 1519    Subjective Pain has been bothering me at work. I was at work on a step to reach overhead and lost balance. Not sure if it was my legs. I have done my exercises 2 times per week.    Patient Stated Goals reduce pain    Currently in Pain? Yes    Pain Score 7     Pain Location Back    Pain Orientation Right    Pain Descriptors / Indicators Aching;Sharp    Pain Type Chronic pain    Pain Onset More than a month ago    Pain Frequency Intermittent    Aggravating Factors  walking, laying on back or side, movement    Pain Relieving Factors massage, stay still    Multiple Pain Sites No                             [facility] Adult PT Treatment/Exercise - 05/27/20 0001       Therapeutic Activites    Therapeutic Activities Lifting;ADL's    ADL's sitting with lumbar lordosis, rolling in bed with spinal neutral    Lifting lifting from waist height with good lordosis; tried from the ground but pain went into the right hip      Lumbar Exercises: Standing   Other Standing Lumbar Exercises lateral shift with left side on wall and therapist helping move the hips to the wall with centralization of pain      Lumbar Exercises: Supine   Other Supine Lumbar Exercises therapist has paitient in supine and bring kness to 90/90, then bring the knees to the right to centralize her pain, once centralized moved her to prone      Lumbar Exercises: Prone   Other Prone Lumbar Exercises lay prone for 2 minutes then pone on elbows for 2 minutes while monitoring form pain and centralization      Manual Therapy   Manual Therapy Taping    Kinesiotex Facilitate Muscle      Kinesiotix   Facilitate Muscle  along th elumbar thoracic paraspinals  PT Education - 05/27/20 1554    Education Details Access Code: [email or web address]; lifting, moving in bed, and sit to stand with correct body mechanics    Person(s) Educated Patient    Methods Explanation;Demonstration;Verbal cues;Handout    Comprehension Verbalized understanding;Returned demonstration            PT Short Term Goals - 05/27/20 1601      PT SHORT TERM GOAL #1   Title independent with initial HEP    Time 4    Period Weeks    Status Achieved             PT Long Term Goals - 05/13/20 1622      PT LONG TERM GOAL #1   Title independent with advanced HEP for core stability and reduction of pain    Baseline not educated yet    Time 12    Period Weeks    Status New    Target Date 08/05/20      PT LONG TERM GOAL #2   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 of the pain    Baseline walking the pain goes down to her right ankle at level  10/10    Time 12    Period Weeks    Status New    Target Date 08/05/20      PT LONG TERM GOAL #3   Title able to sleep through the night without pain waking her up    Baseline wakes her up several times per night    Time 12    Period Weeks    Status New    Target Date 08/05/20      PT LONG TERM GOAL #4   Title bilateral hip strength for abduction and ER >/= 4/5 so she is able to squat and pick up her 45 month old with right back and lower extremity pain at level </= 2/10    Baseline strength is 3+/5 and pain is 10/10    Time 12    Period Weeks    Status New    Target Date 08/05/20      PT LONG TERM GOAL #5   Title understand correct body mechanics with daily and work tasks to reduce the strain on her lumbar spine    Baseline not educated yet    Time 12    Period Weeks    Status New    Target Date 08/05/20                 Plan - 05/27/20 1557    Clinical Impression Statement Patient is having difficulty with centralizing her pain. 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 Patient was able to centralize her pain in therapy with the therapist rotating her lower half to the right. Patient is able to lift item from the waist height with pain in the back but when seh goes tot he floor and lift the pain will radiate into the right hip. Patient will benefit from skilled therapy to reduce back and right leg pain, improve core stabilization for work tasks, and taking care of kids.    Personal Factors and Comorbidities Profession    Examination-Activity Limitations Bed Mobility;Sleep;Stairs;Locomotion Level    Examination-Participation Restrictions Occupation;Community Activity    Stability/Clinical Decision Making Stable/Uncomplicated    Rehab Potential Excellent    PT Frequency 1x / week    PT  Duration 12 weeks    PT Treatment/Interventions ADLs/Self Care Home Management;Cryotherapy;Electrical Stimulation;Moist  Heat;Ultrasound;Therapeutic activities;Therapeutic exercise;Manual techniques;Patient/family education;Neuromuscular re-education;Dry needling;Spinal Manipulations    PT Next Visit Plan next visit put in regular mediciad; see if pain is centralized, work with body mechanics with lifting from ground and reaching overhead    PT Home Exercise Plan Access Code: [email or web address]    Consulted and Agree with Plan of Care Patient           Patient will benefit from skilled therapeutic intervention in order to improve the following deficits and impairments:  Decreased activity tolerance, Decreased strength, Pain, Increased muscle spasms, Decreased range of motion  Visit Diagnosis: Muscle weakness (generalized)  Chronic right-sided low back pain with right-sided sciatica     Problem List Patient Active Problem List   Diagnosis Date Noted  . Sciatic nerve pain 07/27/2018  . Sickle cell trait (HCC) 02/15/2018    Eulis Foster, PT 05/27/20 4:02 PM   [facility] [address], [address] [place], [place], [address] Phone: [phone]   Fax:  [phone]  Name: Shelby Serrano MRN: [id number] Date of Birth: Jun 30, 1996

## 2020-06-03 ENCOUNTER — Other Ambulatory Visit: Payer: Self-pay

## 2020-06-03 ENCOUNTER — Encounter: Payer: Medicaid Other | Admitting: Physical Therapy

## 2020-06-03 ENCOUNTER — Encounter: Payer: Self-pay | Admitting: Physical Therapy

## 2020-06-03 DIAGNOSIS — M5441 Lumbago with sciatica, right side: Secondary | ICD-10-CM

## 2020-06-03 DIAGNOSIS — M6281 Muscle weakness (generalized): Secondary | ICD-10-CM | POA: Diagnosis not present

## 2020-06-03 NOTE — Therapy (Signed)
[facility] [address] [place], [place], [address] Phone: [phone]   Fax:  [phone]  Physical Therapy Treatment  Patient Details  Name: Shelby Serrano MRN: [id number] Date of Birth: [birth year] Referring Provider (PT): Nolene Bernheim, NP   Encounter Date: 06/03/2020   PT End of Session - 06/03/20 1517    Visit Number 4    Date for PT Re-Evaluation 08/05/20    Authorization Type Medicaid [place]    Authorization Time Period 05/20/2020-06/09/2020    Authorization - Visit Number 3    Authorization - Number of Visits 3    PT Start Time 1514    PT Stop Time 1552    PT Time Calculation (min) 38 min    Activity Tolerance Patient tolerated treatment well;No increased pain    Behavior During Therapy WFL for tasks assessed/performed           Past Medical History:  Diagnosis Date  . Sickle cell trait [facility])     Past Surgical History:  Procedure Laterality Date  . TUBAL LIGATION Bilateral 02/18/2020   Procedure: POST PARTUM TUBAL LIGATION;  Surgeon: [place] Bing, MD;  [facility]: [facility] LD [facility];  Service: Gynecology;  Laterality: Bilateral;    There were no vitals filed for this visit.   Subjective Assessment - 06/03/20 1514    Subjective Some days I feel good and other days I do not. The pain stays in the right leg.    Patient Stated Goals reduce pain    Currently in Pain? Yes    Pain Score 2     Pain Orientation Right    Pain Descriptors / Indicators Aching;Sharp    Pain Type Chronic pain    Pain Radiating Towards goes down right leg    Pain Onset More than a month ago    Pain Frequency Intermittent    Aggravating Factors  walking, laying on back or side, movement    Pain Relieving Factors massage, stay still    Multiple Pain Sites No              [facility] PT Assessment - 06/03/20 0001      Assessment   Medical Diagnosis M54.30 Sciatic nerve pain, unspecified laterality    Referring Provider (PT)  Nolene Bernheim, NP    Onset Date/Surgical Date --   2018   Prior Therapy none      Precautions   Precautions None      Restrictions   Weight Bearing Restrictions No      Prior Function   Level of Independence Independent    Vocation Full time employment    Vocation Requirements waitress      Cognition   Overall Cognitive Status Within Functional Limits for tasks assessed      Posture/Postural Control   Posture/Postural Control No significant limitations      AROM   Lumbar Flexion decreased by 50% and going to the right    Lumbar Extension rotate left, full and increase pain in right leg    Lumbar - Left Side Bend decreased by 25% with pain      Strength   Right Hip Extension 3+/5    Right Hip External Rotation  4/5    Right Hip ABduction 3+/5    Right Hip ADduction 4/5    Left Hip Extension 3+/5    Left Hip External Rotation 4/5    Left Hip ABduction 3+/5      Palpation   SI  assessment  right ilium is anteriorly rotated                         [facility] Adult PT Treatment/Exercise - 06/03/20 0001      Therapeutic Activites    Therapeutic Activities ADL's    ADL's rolling in bed and going from supine to ist with correct body mechanics      Manual Therapy   Manual Therapy Soft tissue mobilization;Muscle Energy Technique    Manual therapy comments to assess for dry needling    Soft tissue mobilization using the addaday to improve tissue elongation in the lumbar paraspinals and right quadratus after dry needling; manual soft tissue work to the right hip adductor, quad, hamstring, right diaphragm, right quadratus    Muscle Energy Technique to correct right ilium            Trigger Point Dry Needling - 06/03/20 0001    Consent Given? Yes    Education Handout Provided Previously provided    Muscles Treated Back/Hip Quadratus lumborum;Lumbar multifidi   right   Lumbar multifidi Response Twitch response elicited;Palpable increased muscle length     Quadratus Lumborum Response Twitch response elicited;Palpable increased muscle length                  PT Short Term Goals - 05/27/20 1601      PT SHORT TERM GOAL #1   Title independent with initial HEP    Time 4    Period Weeks    Status Achieved             PT Long Term Goals - 06/03/20 1518      PT LONG TERM GOAL #1   Title independent with advanced HEP for core stability and reduction of pain    Baseline still learning her HEP as she progresses    Time 12    Period Weeks    Status On-going      PT LONG TERM GOAL #2   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 of the pain    Baseline pain in right leg at pain level 10/10    Time 12    Period Weeks    Status On-going      PT LONG TERM GOAL #3   Title able to sleep through the night without pain waking her up    Baseline not able to sleep due to pain keeping her awake    Time 12    Period Weeks    Status On-going      PT LONG TERM GOAL #4   Title bilateral hip strength for abduction and ER >/= 4/5 so she is able to squat and pick up her 50 month old with right back and lower extremity pain at level </= 2/10    Baseline strength is 3+/5 and pain is 10/10    Time 12    Period Weeks    Status On-going      PT LONG TERM GOAL #5   Title understand correct body mechanics with daily and work tasks to reduce the strain on her lumbar spine    Baseline continues to be educated    Time 12    Period Weeks    Status On-going                 Plan - 06/03/20 1558    Clinical  Impression Statement Patient continues to have the pain down the right leg. The pain is intermittent. Patient walks without a limp now. After manual therapy her pelvis was in correct alignment. Patient has trigger points in the right diaphragm, hip adductors, hamstring, psoas and right quadratus. Patient does work a job that requires alot of lifting and reaching while being on  her feet all day. Patient has weakness in the right hip. She will deviate to the right with lumbar flexion and has pain. She has pain with lumbar extension but it is full and no deviation. Her left lumbar sidebending is decreased by 25% with pain. Patient is not able to sleep through the night due to pain. Patient has pain with walking. She is learning correct body mechanics to decrease strain at each PT visit. Patient will benefit from skilled therapy to reduce her back and right leg pain, imporve core stabilization for work tasks and taking care of kids.    Personal Factors and Comorbidities Profession    Examination-Activity Limitations Bed Mobility;Sleep;Stairs;Locomotion Level    Examination-Participation Restrictions Occupation;Community Activity    Stability/Clinical Decision Making Stable/Uncomplicated    Rehab Potential Excellent    PT Frequency 1x / week    PT Duration 12 weeks    PT Treatment/Interventions ADLs/Self Care Home Management;Cryotherapy;Electrical Stimulation;Moist Heat;Ultrasound;Therapeutic activities;Therapeutic exercise;Manual techniques;Patient/family education;Neuromuscular re-education;Dry needling;Spinal Manipulations    PT Next Visit Plan assess dry needling, work on right SI stability, work on trigger points    PT Home Exercise Plan Access Code: [email or web address]    Recommended Other Services sent mediaid rnewal in    Consulted and Agree with Plan of Care Patient           Patient will benefit from skilled therapeutic intervention in order to improve the following deficits and impairments:  Decreased activity tolerance, Decreased strength, Pain, Increased muscle spasms, Decreased range of motion  Visit Diagnosis: Muscle weakness (generalized)  Chronic right-sided low back pain with right-sided sciatica     Problem List Patient Active Problem List   Diagnosis Date Noted  . Sciatic nerve pain 07/27/2018  . Sickle cell trait (HCC) 02/15/2018    Eulis Foster,  PT 06/03/20 4:51 PM   [place] [facility] [address], [address] [place], [place], [address] Phone: [phone]   Fax:  [phone]  Name: Shelby Serrano MRN: [id number] Date of Birth: 1995/11/16

## 2020-06-10 ENCOUNTER — Encounter: Payer: Self-pay | Admitting: Physical Therapy

## 2020-06-10 ENCOUNTER — Other Ambulatory Visit: Payer: Self-pay

## 2020-06-10 ENCOUNTER — Encounter: Payer: Medicaid Other | Admitting: Physical Therapy

## 2020-06-10 DIAGNOSIS — M6281 Muscle weakness (generalized): Secondary | ICD-10-CM

## 2020-06-10 DIAGNOSIS — M5441 Lumbago with sciatica, right side: Secondary | ICD-10-CM

## 2020-06-10 NOTE — Therapy (Signed)
[facility] at [facility] [address] [place], [place], [address] Phone: [phone]   Fax:  [phone]  Physical Therapy Treatment  Patient Details  Name: Angelli Baruch MRN: [id number] Date of Birth: [birth year] Referring Provider (PT): Nolene Bernheim, NP   Encounter Date: 06/10/2020   PT End of Session - 06/10/20 1449    Visit Number 5    Date for PT Re-Evaluation 08/05/20    Authorization Type Medicaid [place]    Authorization Time Period 05/20/2020-06/09/2020    Authorization - Visit Number 4    Authorization - Number of Visits 3    PT Start Time 1402    PT Stop Time 1457    PT Time Calculation (min) 55 min    Activity Tolerance Patient tolerated treatment well;No increased pain    Behavior During Therapy WFL for tasks assessed/performed           Past Medical History:  Diagnosis Date  . Sickle cell trait [facility])     Past Surgical History:  Procedure Laterality Date  . TUBAL LIGATION Bilateral 02/18/2020   Procedure: POST PARTUM TUBAL LIGATION;  Surgeon: [place] Bing, MD;  Location: [facility] LD ORS;  Service: Gynecology;  Laterality: Bilateral;    There were no vitals filed for this visit.   Subjective Assessment - 06/10/20 1406    Subjective I have the same achiness. The pain is getting worse. The pain will sit in the right leg 2-3 minutes.    Patient Stated Goals reduce pain    Currently in Pain? Yes    Pain Score 10-Worst pain ever    Pain Location Back    Pain Orientation Right    Pain Descriptors / Indicators Aching;Sharp    Pain Type Chronic pain    Pain Radiating Towards goes down into the right leg    Pain Onset More than a month ago    Pain Frequency Intermittent    Aggravating Factors  walking, laying on back or side, movement    Pain Relieving Factors massage, stay still    Multiple Pain Sites No                             [facility] Adult PT Treatment/Exercise - 06/10/20 0001       Modalities   Modalities Electrical Stimulation;Moist Heat      Moist Heat Therapy   Number Minutes Moist Heat 15 Minutes    Moist Heat Location Lumbar Spine      Electrical Stimulation   Electrical Stimulation Location lumbar and right buttocks    Electrical Stimulation Action IFC    Electrical Stimulation Parameters to patient tolerance; 15 min    Electrical Stimulation Goals Tone;Pain      Manual Therapy   Manual Therapy Soft tissue mobilization;Myofascial release    Manual therapy comments to assess for dry needling    Soft tissue mobilization soft tissue work to the righ tquadratus in left sidely, lumbar paraspinals, and low thoracic paraspinals    Myofascial Release tissue rolling of the lumbar            Trigger Point Dry Needling - 06/10/20 0001    Consent Given? Yes    Education Handout Provided Previously provided    Muscles Treated Back/Hip Quadratus lumborum;Lumbar multifidi   right   Lumbar multifidi Response Twitch response elicited;Palpable increased muscle length    Quadratus Lumborum Response Twitch response elicited;Palpable increased muscle length  PT Short Term Goals - 05/27/20 1601      PT SHORT TERM GOAL #1   Title independent with initial HEP    Time 4    Period Weeks    Status Achieved             PT Long Term Goals - 06/03/20 1518      PT LONG TERM GOAL #1   Title independent with advanced HEP for core stability and reduction of pain    Baseline still learning her HEP as she progresses    Time 12    Period Weeks    Status On-going      PT LONG TERM GOAL #2   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 of the pain    Baseline pain in right leg at pain level 10/10    Time 12    Period Weeks    Status On-going      PT LONG TERM GOAL #3   Title able to sleep through the night without pain waking her up    Baseline not able to sleep due to pain keeping  her awake    Time 12    Period Weeks    Status On-going      PT LONG TERM GOAL #4   Title bilateral hip strength for abduction and ER >/= 4/5 so she is able to squat and pick up her 24 month old with right back and lower extremity pain at level </= 2/10    Baseline strength is 3+/5 and pain is 10/10    Time 12    Period Weeks    Status On-going      PT LONG TERM GOAL #5   Title understand correct body mechanics with daily and work tasks to reduce the strain on her lumbar spine    Baseline continues to be educated    Time 12    Period Weeks    Status On-going                 Plan - 06/10/20 1449    Clinical Impression Statement Patient reports after manual work and electrical stimulation her pain went from a 10 /10 to 6/10. Patient reports the dry needling helps for several days. The pain in the right leg is worse and had a time she had trouble moving her leg. Patient is trying to use proper body mechanics at work. Patient reports no change with her pain at this time. Therapist advised her to see the person that referred her here. When patient came is she was not limping. Patient will benefit from skilled therapy to redue her back and right leg pian, improve core stabilization for work tasks and taking care of kids.    Personal Factors and Comorbidities Profession    Examination-Activity Limitations Bed Mobility;Sleep;Stairs;Locomotion Level    Examination-Participation Restrictions Occupation;Community Activity    Stability/Clinical Decision Making Stable/Uncomplicated    Rehab Potential Excellent    PT Frequency 1x / week    PT Duration 12 weeks    PT Treatment/Interventions ADLs/Self Care Home Management;Cryotherapy;Electrical Stimulation;Moist Heat;Ultrasound;Therapeutic activities;Therapeutic exercise;Manual techniques;Patient/family education;Neuromuscular re-education;Dry needling;Spinal Manipulations    PT Next Visit Plan dry needling, work on right SI stability, work on  trigger points, see if e-stim has helped, see if she called her referral doctor    PT Home Exercise Plan Access Code: [email or web address]    Consulted and Agree with  Plan of Care Patient           Patient will benefit from skilled therapeutic intervention in order to improve the following deficits and impairments:  Decreased activity tolerance, Decreased strength, Pain, Increased muscle spasms, Decreased range of motion  Visit Diagnosis: Muscle weakness (generalized)  Chronic right-sided low back pain with right-sided sciatica     Problem List Patient Active Problem List   Diagnosis Date Noted  . Sciatic nerve pain 07/27/2018  . Sickle cell trait (HCC) 02/15/2018    Eulis Foster, PT 06/10/20 3:02 PM   [place] [facility] at [facility] [address], [address] [place], [place], [address] Phone: [phone]   Fax:  [phone]  Name: Ebonique Hallstrom MRN: [id number] Date of Birth: [birth year]

## 2020-07-01 ENCOUNTER — Other Ambulatory Visit: Payer: Self-pay

## 2020-07-01 ENCOUNTER — Encounter: Payer: Medicaid Other | Attending: Nurse Practitioner | Admitting: Physical Therapy

## 2020-07-01 ENCOUNTER — Encounter: Payer: Self-pay | Admitting: Physical Therapy

## 2020-07-01 DIAGNOSIS — M5441 Lumbago with sciatica, right side: Secondary | ICD-10-CM | POA: Diagnosis present

## 2020-07-01 DIAGNOSIS — G8929 Other chronic pain: Secondary | ICD-10-CM | POA: Diagnosis present

## 2020-07-01 DIAGNOSIS — M6281 Muscle weakness (generalized): Secondary | ICD-10-CM | POA: Diagnosis present

## 2020-07-01 NOTE — Therapy (Signed)
[facility] at [facility] [address], [address] [place], [place], [address] Phone: [phone]   Fax:  [phone]  Physical Therapy Treatment  Patient Details  Name: Shelby Serrano MRN: [id number] Date of Birth: 03-Oct-1995 Referring Provider (PT): Nolene Bernheim, NP   Encounter Date: 07/01/2020   PT End of Session - 07/01/20 1720    Visit Number 6    Date for PT Re-Evaluation 08/05/20    Authorization Type Medicaid [place]    Authorization Time Period 06/24/2020-09/01/20    Authorization - Visit Number 1    Authorization - Number of Visits 10    PT Start Time 1400    PT Stop Time 1445    PT Time Calculation (min) 45 min    Activity Tolerance Patient tolerated treatment well;No increased pain    Behavior During Therapy WFL for tasks assessed/performed           Past Medical History:  Diagnosis Date  . Sickle cell trait [facility])     Past Surgical History:  Procedure Laterality Date  . TUBAL LIGATION Bilateral 02/18/2020   Procedure: POST PARTUM TUBAL LIGATION;  Surgeon: [place] Bing, MD;  Location: [facility] LD ORS;  Service: Gynecology;  Laterality: Bilateral;    There were no vitals filed for this visit.   Subjective Assessment - 07/01/20 1412    Subjective I have on and off pain in my knees and will rest to help the pain. The electrical stim hekp with the pain. I see the doctor in 07/09/2020.    Patient Stated Goals reduce pain    Currently in Pain? Yes    Pain Score 7     Pain Location Back    Pain Orientation Right    Pain Descriptors / Indicators Sharp;Stabbing    Pain Type Chronic pain    Pain Onset More than a month ago    Pain Frequency Intermittent    Aggravating Factors  walking, laying on back or side, movement    Pain Relieving Factors massage and stay still    Multiple Pain Sites No    Pain Score 10    Pain Location Knee    Pain Orientation Left;Right    Pain Descriptors / Indicators Aching;Burning;Stabbing     Pain Type Acute pain    Pain Onset More than a month ago    Pain Frequency Intermittent    Aggravating Factors  taking a step    Pain Relieving Factors no movement              [facility] PT Assessment - 07/01/20 0001      AROM   Lumbar Extension ASIS are equal                         [facility] Adult PT Treatment/Exercise - 07/01/20 0001      Lumbar Exercises: Stretches   Piriformis Stretch Right;Left;1 rep;30 seconds    Piriformis Stretch Limitations supine    Other Lumbar Stretch Exercise Thread the needle holding for 30 sec with monitoring patient pain     Other Lumbar Stretch Exercise open book thoracic rotation stretch 15x each side      Lumbar Exercises: Supine   Ab Set 15 reps;5 seconds    AB Set Limitations verbal cues to breath and not hold breath, do not lift her chest up    Dead Bug 15 reps;1 second    Dead Bug Limitations each side    Bridge 15  reps    Bridge Limitations with bil. 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 of the pain    Baseline pain in right leg at pain level 10/10    Period Weeks    Status On-going      PT LONG TERM GOAL #3   Title able to sleep through the night without pain waking her up    Baseline not able to sleep due to pain keeping her awake    Time 12    Period Weeks    Status On-going       PT LONG TERM GOAL #4   Title bilateral hip strength for abduction and ER >/= 4/5 so she is able to squat and pick up her 13 month old with right back and lower extremity pain at level </= 2/10    Baseline strength is 3+/5 and pain is 10/10    Time 12    Period Weeks      PT LONG TERM GOAL #5   Title understand correct body mechanics with daily and work tasks to reduce the strain on her lumbar spine    Time 12    Period Weeks    Status Achieved                 Plan - 07/01/20 1723    Clinical Impression Statement Patient reports she is having pain in her back that will run to her upper back and down to bilateral knees. Patient walked easily into therapy and out. AS she was moving on the mat she demonstrated pain. Patient did better with exercise while the therapist distracted her so she was not thinking of her pain. Patient has talked about work being a stress on her due to the demands. Paitent was able to do the exericse without difficulty. Patient did not limp while in therapy. Her pelvis is in correct alignment. We did not need the electrical stimulation today due to her pain decreasing to a 6/10 after therapy. Patient will benefit from skilled therapy to reduce her back and right leg pain, improve core stabilization for work tasks and taking care of kids.    Personal Factors and Comorbidities Profession    Examination-Activity Limitations Bed Mobility;Sleep;Stairs;Locomotion Level    Stability/Clinical Decision Making Stable/Uncomplicated    Rehab Potential Excellent    PT Frequency 1x / week    PT Duration 12 weeks    PT Treatment/Interventions ADLs/Self Care Home Management;Cryotherapy;Electrical Stimulation;Moist Heat;Ultrasound;Therapeutic activities;Therapeutic exercise;Manual techniques;Patient/family  education;Neuromuscular re-education;Dry needling;Spinal Manipulations    PT Next Visit Plan continue to work on stabilizaiton exercises, stim if needed, see what MD says     PT Home Exercise Plan Access Code: [email or web address]    Recommended Other Services approved for medicaid    Consulted and Agree with Plan of Care Patient           Patient will benefit from skilled therapeutic intervention in order to improve the following deficits and impairments:  Decreased activity tolerance, Decreased strength, Pain, Increased muscle spasms, Decreased range of motion  Visit Diagnosis: Muscle weakness (generalized)  Chronic right-sided low back pain with right-sided sciatica     Problem List Patient Active Problem List   Diagnosis Date Noted  . Sciatic nerve pain 07/27/2018  . Sickle cell trait (HCC) 02/15/2018    Eulis Foster, PT 07/01/20 5:30 PM   [place] [facility] at [facility] [address] [place], [place], [address] Phone: [phone]   Fax:  [phone]  Name: Shelby Serrano MRN: [id number] Date of Birth: 04/16/1996

## 2020-07-08 ENCOUNTER — Other Ambulatory Visit: Payer: Self-pay

## 2020-07-08 ENCOUNTER — Encounter: Payer: Self-pay | Admitting: Physical Therapy

## 2020-07-08 ENCOUNTER — Encounter: Payer: Medicaid Other | Admitting: Physical Therapy

## 2020-07-08 DIAGNOSIS — G8929 Other chronic pain: Secondary | ICD-10-CM

## 2020-07-08 DIAGNOSIS — M6281 Muscle weakness (generalized): Secondary | ICD-10-CM

## 2020-07-08 NOTE — Therapy (Signed)
[place] at [facility] [address] [place], [place], [address] Phone: [phone]   Fax:  [phone]  Physical Therapy Treatment  Patient Details  Name: Shelby Serrano MRN: [id number] Date of Birth: 01-17-1996 Referring Provider (PT): Earlie Server, NP   Encounter Date: 07/08/2020   PT End of Session - 07/08/20 1149    Visit Number 7    Date for PT Re-Evaluation 08/05/20    Authorization Type Medicaid [place]    Authorization Time Period 06/24/2020-09/01/20    Authorization - Visit Number 2    Authorization - Number of Visits 10    PT Start Time 3846   came late due to work   PT Stop Time 1220    PT Time Calculation (min) 35 min    Activity Tolerance Patient tolerated treatment well;No increased pain    Behavior During Therapy WFL for tasks assessed/performed           Past Medical History:  Diagnosis Date  . Sickle cell trait [facility])     Past Surgical History:  Procedure Laterality Date  . TUBAL LIGATION Bilateral 02/18/2020   Procedure: POST PARTUM TUBAL LIGATION;  Surgeon: Aletha Halim, MD;  Location: [facility] LD ORS;  Service: Gynecology;  Laterality: Bilateral;    There were no vitals filed for this visit.   Subjective Assessment - 07/08/20 1149    Subjective I have been doing the exercises and they are helping. It releases some pain and feels like she is stretching her muscles.    Patient Stated Goals reduce pain    Currently in Pain? Yes    Pain Score 7    Tylenol decreased pain to 4/10   Pain Location Back    Pain Orientation Right    Pain Descriptors / Indicators Sharp;Stabbing    Pain Type Chronic pain    Pain Onset More than a month ago    Pain Frequency Intermittent    Aggravating Factors  walking, laying on back    Pain Relieving Factors massage and exercise    Multiple Pain Sites Yes    Pain Score 5    Pain Location Knee    Pain Orientation Right;Left    Pain Descriptors / Indicators  Aching;Burning;Stabbing    Pain Type Acute pain    Pain Onset More than a month ago    Pain Frequency Intermittent    Aggravating Factors  taking a step    Pain Relieving Factors no movement                             [facility] Adult PT Treatment/Exercise - 07/08/20 0001      Lumbar Exercises: Stretches   Active Hamstring Stretch Right;Left;1 rep;30 seconds    Active Hamstring Stretch Limitations supine with strap    Hip Flexor Stretch Right;Left;1 rep;30 seconds    Hip Flexor Stretch Limitations prone with strap    Press Ups 2 reps;5 seconds    Quadruped Mid Back Stretch 3 reps;30 seconds    Quadruped Mid Back Stretch Limitations all three ways in childs pose    ITB Stretch Right;Left;1 rep;30 seconds    ITB Stretch Limitations supine with strap, apinful so have to adapt    Piriformis Stretch Right;Left;1 rep;30 seconds    Piriformis Stretch Limitations pigeon pose    Other Lumbar Stretch Exercise hip adductor stretch with strap in supine      Lumbar Exercises: Supine  Bent Knee Raise 20 reps;1 second    Bent Knee Raise Limitations with abdominal bracing    Dead Bug 15 reps;1 second    Dead Bug Limitations each side    Bridge 15 reps    Bridge Limitations with bil. shoulder flexion     Other Supine Lumbar Exercises supine alternate shoulder flexion 20x with abdominal bracing      Knee/Hip Exercises: Sidelying   Hip ADduction Strengthening;Right;Left;1 set;10 reps                  PT Education - 07/08/20 1223    Education Details Access Code: [email or web address]    Person(s) Educated Patient    Methods Explanation;Demonstration;Verbal cues;Handout    Comprehension Returned demonstration;Verbalized understanding            PT Short Term Goals - 05/27/20 1601      PT SHORT TERM GOAL #1   Title independent with initial HEP    Time 4    Period Weeks    Status Achieved             PT Long Term Goals - 07/08/20 1230      PT LONG TERM GOAL #1    Title independent with advanced HEP for core stability and reduction of pain    Baseline still learning her HEP as she progresses    Time 12    Period Weeks    Status On-going      PT LONG TERM GOAL #2   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 due to centralization of the pain    Time 12    Period Weeks    Status On-going      PT LONG TERM GOAL #3   Title able to sleep through the night without pain waking her up    Baseline not able to sleep due to pain keeping her awake    Time 12    Period Weeks    Status On-going      PT LONG TERM GOAL #4   Title bilateral hip strength for abduction and ER >/= 4/5 so she is able to squat and pick up her 46 month old with right back and lower extremity pain at level </= 2/10    Baseline strength is 3+/5 and pain is 10/10    Period Weeks    Status On-going      PT LONG TERM GOAL #5   Title understand correct body mechanics with daily and work tasks to reduce the strain on her lumbar spine    Baseline met    Period Weeks    Status Achieved                 Plan - 07/08/20 1226    Clinical Impression Statement Patient was able to walk into therapy with equal step length and fluid motion. Patient HEP is advanced today. She did have some trouble with right hip adduction due to pain and weakness. Patient was able to do the dead bug with slight pain in right hip. Patient pain contnues to be a limiting factor with her daily tasks. Patient will be seeing her doctor tomorrow to assess her pain. Patient will benefit from skilled therapy to reduce he rback and right leg pain, improve core stabilization for work tasks and taking care of kids.    Personal Factors and Comorbidities Profession    Examination-Activity Limitations Bed Mobility;Sleep;Stairs;Locomotion Level  Examination-Participation Restrictions Occupation;Community Activity    Stability/Clinical Decision Making Stable/Uncomplicated    Rehab  Potential Excellent    PT Frequency 1x / week    PT Duration 12 weeks    PT Treatment/Interventions ADLs/Self Care Home Management;Cryotherapy;Electrical Stimulation;Moist Heat;Ultrasound;Therapeutic activities;Therapeutic exercise;Manual techniques;Patient/family education;Neuromuscular re-education;Dry needling;Spinal Manipulations    PT Next Visit Plan continue to work on stabilizaiton exercises, stim if needed, see what MD says; see how hip adduction is doing    PT Home Exercise Plan Access Code: [email or web address]    Consulted and Agree with Plan of Care Patient           Patient will benefit from skilled therapeutic intervention in order to improve the following deficits and impairments:  Decreased activity tolerance, Decreased strength, Pain, Increased muscle spasms, Decreased range of motion  Visit Diagnosis: Muscle weakness (generalized)  Chronic right-sided low back pain with right-sided sciatica     Problem List Patient Active Problem List   Diagnosis Date Noted  . Sciatic nerve pain 07/27/2018  . Sickle cell trait ([place]) 02/15/2018    Earlie Counts, PT 07/08/20 12:31 PM   [place] [facility] at [facility] [address], [place], [place], [address] Phone: [phone]   Fax:  [phone]  Name: Kalliope Riesen MRN: [id number] Date of Birth: 25-Dec-1995

## 2020-07-08 NOTE — Patient Instructions (Signed)
Access Code: [email or web address] URL: [email or web address] Date: 07/08/2020 Prepared by: Eulis Foster  Exercises Supine Piriformis Stretch Pulling Heel to Hip - 1 x daily - 7 x weekly - 1 sets - 2 reps - 30 sec hold Supine Transversus Abdominis Bracing - Hands on Stomach - 1 x daily - 7 x weekly - 1 sets - 10 reps - 5 sec hold Prone Gluteal Sets - 1 x daily - 7 x weekly - 1 sets - 10 reps - 5 sec hold Standing Lumbar Extension - 1 x daily - 7 x weekly - 1 sets - 5 reps Left Standing Lateral Shift Correction at Wall - Hold - 1 x daily - 7 x weekly - 1 sets - 10 reps Dead Bug - 1 x daily - 7 x weekly - 1 sets - 10 reps Bridge - 1 x daily - 7 x weekly - 2 sets - 10 reps Sidelying Hip Adduction - 1 x daily - 7 x weekly - 1 sets - 10 reps  Patient Education Trigger Point Dry Needling Eulis Foster, PT [facility] [address] [place], [place] [address] W: [phone] [email or web address]

## 2020-07-09 ENCOUNTER — Ambulatory Visit: Payer: Medicaid Other | Admitting: Nurse Practitioner

## 2020-07-15 ENCOUNTER — Encounter: Payer: Medicaid Other | Attending: Nurse Practitioner | Admitting: Physical Therapy

## 2020-07-15 ENCOUNTER — Telehealth: Payer: Self-pay | Admitting: Physical Therapy

## 2020-07-15 DIAGNOSIS — M5441 Lumbago with sciatica, right side: Secondary | ICD-10-CM | POA: Insufficient documentation

## 2020-07-15 DIAGNOSIS — M6281 Muscle weakness (generalized): Secondary | ICD-10-CM | POA: Insufficient documentation

## 2020-07-15 DIAGNOSIS — G8929 Other chronic pain: Secondary | ICD-10-CM | POA: Insufficient documentation

## 2020-07-15 NOTE — Telephone Encounter (Signed)
Called patient about her missed appointment today at 11:30. She forgot about it due to it being her daughters birthday. She knows about her next appointment and will be there.  Shelby Serrano, PT @11 /10/2019@ 11:59 AM

## 2020-07-22 ENCOUNTER — Encounter: Payer: Medicaid Other | Admitting: Physical Therapy

## 2020-07-22 ENCOUNTER — Other Ambulatory Visit: Payer: Self-pay

## 2020-07-22 ENCOUNTER — Encounter: Payer: Self-pay | Admitting: Physical Therapy

## 2020-07-22 DIAGNOSIS — G8929 Other chronic pain: Secondary | ICD-10-CM

## 2020-07-22 DIAGNOSIS — M6281 Muscle weakness (generalized): Secondary | ICD-10-CM | POA: Diagnosis not present

## 2020-07-22 DIAGNOSIS — M5441 Lumbago with sciatica, right side: Secondary | ICD-10-CM | POA: Diagnosis present

## 2020-07-22 NOTE — Therapy (Signed)
[facility]patient Rehabilitation at [facility] [address], [address] [place], [place], [address] Phone: [phone]   Fax:  [phone]  Physical Therapy Treatment  Patient Details  Name: Shelby Serrano MRN: [id number] Date of Birth: 05-07-1996 Referring Provider (PT): Nolene Bernheim, NP   Encounter Date: 07/22/2020   PT End of Session - 07/22/20 1403    Visit Number 8    Date for PT Re-Evaluation 08/05/20    Authorization Type Medicaid     Authorization Time Period 06/24/2020-09/01/20    Authorization - Visit Number 3    Authorization - Number of Visits 10    PT Start Time 1400    PT Stop Time 1455    PT Time Calculation (min) 55 min    Activity Tolerance Patient tolerated treatment well;No increased pain    Behavior During Therapy WFL for tasks assessed/performed           Past Medical History:  Diagnosis Date  . Sickle cell trait [facility])     Past Surgical History:  Procedure Laterality Date  . TUBAL LIGATION Bilateral 02/18/2020   Procedure: POST PARTUM TUBAL LIGATION;  Surgeon: Vieques Bing, MD;  Location: [facility] LD ORS;  Service: Gynecology;  Laterality: Bilateral;    There were no vitals filed for this visit.   Subjective Assessment - 07/22/20 1357    Subjective I have been in lots of pain. THe pain is the same. I feel better for 1-2 days then it comes back.    Patient Stated Goals reduce pain    Currently in Pain? Yes    Pain Score 8     Pain Location Back    Pain Orientation Right    Pain Descriptors / Indicators Sharp;Stabbing    Pain Type Chronic pain    Pain Radiating Towards pain radiate inot stomach that is sharp and stabbing    Pain Onset More than a month ago    Pain Frequency Intermittent    Aggravating Factors  walking, laying on back    Pain Relieving Factors massage and exercise    Multiple Pain Sites No              [facility] PT Assessment - 07/22/20 0001      Assessment   Medical Diagnosis M54.30 Sciatic nerve  pain, unspecified laterality    Referring Provider (PT) Nolene Bernheim, NP    Onset Date/Surgical Date --   2018   Prior Therapy none      Precautions   Precautions None      Restrictions   Weight Bearing Restrictions No      Prior Function   Level of Independence Independent    Vocation Full time employment    Vocation Requirements waitress      AROM   Lumbar Flexion decreased by 50% and going to the right    Lumbar - Left Side Bend decreased by 25% with pain      Palpation   SI assessment  ASIS are equal    Palpation comment tenderness located in the lumbar paraspinals and gluteus medius      Special Tests    Special Tests Lumbar      Straight Leg Raise   Findings Positive    Side  Right    Comment patient reports pulling into right ankle                         [facility] Adult PT Treatment/Exercise - 07/22/20  0001      Lumbar Exercises: Prone   Opposite Arm/Leg Raise Right arm/Left leg;Left arm/Right leg;10 reps;1 second    Opposite Arm/Leg Raise Limitations monitoring for technique and pain      Knee/Hip Exercises: Sidelying   Clams 10 times each side  monitoring for pain    Other Sidelying Knee/Hip Exercises open book in sidely 10x each side with guiding patient through the stretch and helping her understand her discomfort      Moist Heat Therapy   Number Minutes Moist Heat 15 Minutes    Moist Heat Location Lumbar Spine      Manual Therapy   Manual Therapy Myofascial release;Soft tissue mobilization    Soft tissue mobilization soft tissue work to the lumbar paraspinals, quadratus    Myofascial Release tissue rolling to lumbar, using the Iastim tool to relase the fascia of the muscle            Trigger Point Dry Needling - 07/22/20 0001    Consent Given? Yes    Education Handout Provided Previously provided    Muscles Treated Back/Hip Lumbar multifidi   1 time but had too much pain so stopped   Lumbar multifidi Response --   painful response                   PT Short Term Goals - 05/27/20 1601      PT SHORT TERM GOAL #1   Title independent with initial HEP    Time 4    Period Weeks    Status Achieved             PT Long Term Goals - 07/22/20 1446      PT LONG TERM GOAL #1   Title independent with advanced HEP for core stability and reduction of pain    Baseline still learning her HEP as she progresses    Time 12    Period Weeks    Status On-going      PT LONG TERM GOAL #2   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 of the pain    Baseline pain in right leg at pain level 10/10    Time 12    Period Weeks    Status On-going      PT LONG TERM GOAL #3   Title able to sleep through the night without pain waking her up    Baseline not able to sleep due to pain keeping her awake    Time 12    Period Weeks    Status On-going      PT LONG TERM GOAL #4   Title bilateral hip strength for abduction and ER >/= 4/5 so she is able to squat and pick up her 68 month old with right back and lower extremity pain at level </= 2/10    Baseline strength is 3+/5 and pain is 10/10    Time 12    Period Weeks    Status On-going      PT LONG TERM GOAL #5   Title understand correct body mechanics with daily and work tasks to reduce the strain on her lumbar spine    Time 12    Period Weeks    Status Achieved                 Plan - 07/22/20 1401    Clinical Impression Statement Patient reports no change  in pain. She will feel better for 2 days after therapy then the pain returns. Patient has tenderness located in the lumbar paraspinals and gluteus medius. She has limited lumbar ROM due to pain. Patient was not able to tolerate dry needling today due to the pain. Patient was advised to go back to her doctor. Patient was not able to tolerate her exercises today due to pain. AFter manual work and moist heat the pain decreased to 6/10 from 8/10. Patient will  benefit from skilled therapy to reduce her back and right leg pain, improve core stabilization for work tasks.    Personal Factors and Comorbidities Profession    Examination-Activity Limitations Bed Mobility;Sleep;Stairs;Locomotion Level    Examination-Participation Restrictions Occupation;Community Activity    Stability/Clinical Decision Making Stable/Uncomplicated    Rehab Potential Excellent    PT Frequency 1x / week    PT Duration 12 weeks    PT Treatment/Interventions ADLs/Self Care Home Management;Cryotherapy;Electrical Stimulation;Moist Heat;Ultrasound;Therapeutic activities;Therapeutic exercise;Manual techniques;Patient/family education;Neuromuscular re-education;Dry needling;Spinal Manipulations    PT Next Visit Plan see what MD says, if no change next week discharge    PT Home Exercise Plan Access Code: [email or web address]    Consulted and Agree with Plan of Care Patient           Patient will benefit from skilled therapeutic intervention in order to improve the following deficits and impairments:  Decreased activity tolerance, Decreased strength, Pain, Increased muscle spasms, Decreased range of motion  Visit Diagnosis: Muscle weakness (generalized)  Chronic right-sided low back pain with right-sided sciatica     Problem List Patient Active Problem List   Diagnosis Date Noted  . Sciatic nerve pain 07/27/2018  . Sickle cell trait (HCC) 02/15/2018    Eulis Foster, PT 07/22/20 2:55 PM    Outpatient Rehabilitation at [facility] for Women [address], [address] [place], [place], [address] Phone: [phone]   Fax:  [phone]  Name: Shelby Serrano MRN: [id number] Date of Birth: September 21, 1995

## 2020-07-29 ENCOUNTER — Encounter: Payer: Self-pay | Admitting: Physical Therapy

## 2020-07-29 ENCOUNTER — Telehealth: Payer: Self-pay | Admitting: Physical Therapy

## 2020-07-29 NOTE — Telephone Encounter (Signed)
Called patient about her missed appointment today at 1400. She said she called to cancel and will be at her next appointment.  Shelby Serrano, PT @11 /16/2021@ 2:32 PM

## 2020-08-05 ENCOUNTER — Encounter: Payer: Self-pay | Admitting: Physical Therapy

## 2020-08-12 ENCOUNTER — Encounter: Payer: Medicaid Other | Admitting: Physical Therapy

## 2020-08-12 ENCOUNTER — Encounter: Payer: Self-pay | Admitting: Physical Therapy

## 2020-08-12 ENCOUNTER — Other Ambulatory Visit: Payer: Self-pay

## 2020-08-12 ENCOUNTER — Ambulatory Visit (INDEPENDENT_AMBULATORY_CARE_PROVIDER_SITE_OTHER): Payer: Medicaid Other

## 2020-08-12 DIAGNOSIS — G8929 Other chronic pain: Secondary | ICD-10-CM

## 2020-08-12 DIAGNOSIS — M6281 Muscle weakness (generalized): Secondary | ICD-10-CM

## 2020-08-12 DIAGNOSIS — Z3202 Encounter for pregnancy test, result negative: Secondary | ICD-10-CM

## 2020-08-12 NOTE — Therapy (Signed)
[facility] Outpatient Rehabilitation at [facility] [address] [place], [place], [address] Phone: [phone]   Fax:  [phone]  Physical Therapy Treatment  Patient Details  Name: Shelby Serrano MRN: [id number] Date of Birth: 22-Sep-1995 Referring Provider (PT): Nolene Bernheim, NP   Encounter Date: 08/12/2020   PT End of Session - 08/12/20 1457    Visit Number 9    Date for PT Re-Evaluation 09/02/20    Authorization Type Medicaid [place]    Authorization Time Period 06/24/2020-09/01/20    Authorization - Visit Number 4    Authorization - Number of Visits 10    PT Start Time 1400    PT Stop Time 1450    PT Time Calculation (min) 50 min    Activity Tolerance Patient tolerated treatment well;No increased pain    Behavior During Therapy WFL for tasks assessed/performed           Past Medical History:  Diagnosis Date  . Sickle cell trait [facility])     Past Surgical History:  Procedure Laterality Date  . TUBAL LIGATION Bilateral 02/18/2020   Procedure: POST PARTUM TUBAL LIGATION;  Surgeon:  Bing, MD;  Location: [facility] LD ORS;  Service: Gynecology;  Laterality: Bilateral;    There were no vitals filed for this visit.   Subjective Assessment - 08/12/20 1404    Subjective I am doing okay and have the same constant pain.    Patient Stated Goals reduce pain    Currently in Pain? Yes    Pain Score 8     Pain Location Back    Pain Orientation Right    Pain Descriptors / Indicators Sharp;Stabbing    Pain Type Chronic pain    Pain Onset More than a month ago    Pain Frequency Intermittent    Aggravating Factors  walking, laying on back, work    Pain Relieving Factors massage and exercise    Multiple Pain Sites No              [facility] PT Assessment - 08/12/20 0001      Assessment   Medical Diagnosis M54.30 Sciatic nerve pain, unspecified laterality    Referring Provider (PT) Nolene Bernheim, NP    Onset Date/Surgical Date --   2018    Prior Therapy none      Precautions   Precautions None      Restrictions   Weight Bearing Restrictions No      Prior Function   Level of Independence Independent    Vocation Full time employment    Vocation Requirements waitress      AROM   Lumbar Flexion full    Lumbar Extension full    Lumbar - Left Side Bend full      Strength   Right Hip Extension 5/5    Right Hip External Rotation  5/5    Right Hip ABduction 3+/5    Right Hip ADduction 5/5    Left Hip Extension 5/5    Left Hip External Rotation 5/5    Left Hip ABduction 4+/5      Palpation   SI assessment  Pelvis in correct alignment                         [facility] Adult PT Treatment/Exercise - 08/12/20 0001      Knee/Hip Exercises: Sidelying   Hip ABduction Strengthening;Right;1 set;10 reps    Hip ABduction Limitations keeping the pelvis still  Modalities   Modalities Ultrasound      Ultrasound   Ultrasound Location right quadratus    Ultrasound Parameters 100%, 1 mz, 8 minutes    Ultrasound Goals Pain      Manual Therapy   Manual Therapy Soft tissue mobilization;Joint mobilization    Joint Mobilization Gapping of the right facet of L2-L5, correct sacral rotation to the right, distraction of the L5S1 facet, rotational mobilization to the L2-L5    Soft tissue mobilization soft tissue work to the right quadratus, right lumbar paraspianls, along the right post. and lateral diaphragm and along the right iliac crease, along the right SI joint                  PT Education - 08/12/20 1457    Education Details right hip abduction in sidely    Person(s) Educated Patient    Methods Explanation;Demonstration    Comprehension Verbalized understanding;Returned demonstration            PT Short Term Goals - 05/27/20 1601      PT SHORT TERM GOAL #1   Title independent with initial HEP    Time 4    Period Weeks    Status Achieved             PT Long Term Goals - 07/22/20 1446       PT LONG TERM GOAL #1   Title independent with advanced HEP for core stability and reduction of pain    Baseline still learning her HEP as she progresses    Time 12    Period Weeks    Status On-going      PT LONG TERM GOAL #2   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 of the pain    Baseline pain in right leg at pain level 10/10    Time 12    Period Weeks    Status On-going      PT LONG TERM GOAL #3   Title able to sleep through the night without pain waking her up    Baseline not able to sleep due to pain keeping her awake    Time 12    Period Weeks    Status On-going      PT LONG TERM GOAL #4   Title bilateral hip strength for abduction and ER >/= 4/5 so she is able to squat and pick up her 69 month old with right back and lower extremity pain at level </= 2/10    Baseline strength is 3+/5 and pain is 10/10    Time 12    Period Weeks    Status On-going      PT LONG TERM GOAL #5   Title understand correct body mechanics with daily and work tasks to reduce the strain on her lumbar spine    Time 12    Period Weeks    Status Achieved                 Plan - 08/12/20 1459    Clinical Impression Statement Patient has full lumbar ROM with pain but after treatment today there was no pain. She has no pain on the days of treatment but when she returns to work the pain appears and those are the nights she has trouble sleeping. Patient has improved bilateral hip strength with exception of hip right hip abduction 3+/5. Patient sacrum is rotated right and  ASIS are equal. She has a muscle spasm in the right quadratus. She has decreased movement of right facets of L3-L5. Patient will benefit from skilled therapy to resture lumbar facet mobioity and reduce the right quadratus muscle spasm for 4 more visits.    Personal Factors and Comorbidities Profession    Examination-Activity Limitations Bed  Mobility;Sleep;Stairs;Locomotion Level    Examination-Participation Restrictions Occupation;Community Activity    Stability/Clinical Decision Making Stable/Uncomplicated    Rehab Potential Excellent    PT Frequency 1x / week    PT Duration 4 weeks    PT Treatment/Interventions ADLs/Self Care Home Management;Cryotherapy;Electrical Stimulation;Moist Heat;Ultrasound;Therapeutic activities;Therapeutic exercise;Manual techniques;Patient/family education;Neuromuscular re-education;Dry needling;Spinal Manipulations    PT Next Visit Plan joint mobilization, see how she felt from last visit , see is she found a primary MD    PT Home Exercise Plan Access Code: [email or web address]    Recommended Other Services sent MD renewal    Consulted and Agree with Plan of Care Patient           Patient will benefit from skilled therapeutic intervention in order to improve the following deficits and impairments:  Decreased activity tolerance, Decreased strength, Pain, Increased muscle spasms, Decreased range of motion  Visit Diagnosis: Muscle weakness (generalized) - Plan: PT plan of care cert/re-cert  Chronic right-sided low back pain with right-sided sciatica - Plan: PT plan of care cert/re-cert     Problem List Patient Active Problem List   Diagnosis Date Noted  . Sciatic nerve pain 07/27/2018  . Sickle cell trait (HCC) 02/15/2018    Eulis Foster, PT 08/12/20 3:07 PM   [place] Outpatient Rehabilitation at [facility] [address] [place], [place], [address] Phone: [phone]   Fax:  [phone]  Name: Waynetta Metheny MRN: [id number] Date of Birth: [birth year]

## 2020-08-12 NOTE — Progress Notes (Signed)
Pt left urine for walk in urine pregnancy test.  Pregnancy test resulted negative.  Called pt with results.  Pt informed me that she had a positive test at home.  I encouraged pt to retest in a week here at the office and to try and see if she can not leave a urine in the morning.  Pt verbalized understanding with no further questions or concerns.  Addison Naegeli, RN  08/12/20

## 2020-08-19 ENCOUNTER — Encounter: Payer: Self-pay | Admitting: Physical Therapy

## 2020-08-19 ENCOUNTER — Encounter: Payer: Medicaid Other | Attending: Nurse Practitioner | Admitting: Physical Therapy

## 2020-08-19 ENCOUNTER — Other Ambulatory Visit: Payer: Self-pay

## 2020-08-19 DIAGNOSIS — M5441 Lumbago with sciatica, right side: Secondary | ICD-10-CM | POA: Insufficient documentation

## 2020-08-19 DIAGNOSIS — M6281 Muscle weakness (generalized): Secondary | ICD-10-CM | POA: Diagnosis present

## 2020-08-19 DIAGNOSIS — G8929 Other chronic pain: Secondary | ICD-10-CM | POA: Diagnosis present

## 2020-08-19 NOTE — Therapy (Addendum)
[place] at [facility] [address], [place], [place], [address] Phone: [phone]   Fax:  [phone]  Physical Therapy Treatment  Patient Details  Name: Shelby Serrano MRN: [id number] Date of Birth: [birth year] Referring Provider (PT): Earlie Server, NP   Encounter Date: 08/19/2020   PT End of Session - 08/19/20 1449    Visit Number 10    Date for PT Re-Evaluation 09/02/20    Authorization Type Medicaid [place]    Authorization Time Period 06/24/2020-09/01/20    Authorization - Visit Number 5    Authorization - Number of Visits 10    PT Start Time 6767    PT Stop Time 1455    PT Time Calculation (min) 50 min    Activity Tolerance Patient tolerated treatment well;No increased pain    Behavior During Therapy WFL for tasks assessed/performed           Past Medical History:  Diagnosis Date  . Sickle cell trait [facility])     Past Surgical History:  Procedure Laterality Date  . TUBAL LIGATION Bilateral 02/18/2020   Procedure: POST PARTUM TUBAL LIGATION;  Surgeon: Aletha Halim, MD;  Location: [facility] LD ORS;  Service: Gynecology;  Laterality: Bilateral;    There were no vitals filed for this visit.   Subjective Assessment - 08/19/20 1410    Subjective was doing well until yesterday. I had a home prgnancy test that was positive and the one at the doctors office was negative.    Patient Stated Goals reduce pain    Currently in Pain? Yes    Pain Score 5     Pain Location Back    Pain Orientation Posterior    Pain Descriptors / Indicators Stabbing    Pain Type Chronic pain    Pain Onset More than a month ago    Pain Frequency Intermittent    Aggravating Factors  when she moves    Pain Relieving Factors lay donw    Multiple Pain Sites No              [facility] PT Assessment - 08/19/20 0001      Assessment   Medical Diagnosis M54.30 Sciatic nerve pain, unspecified laterality    Referring Provider (PT) Earlie Server,  NP    Onset Date/Surgical Date --   2018   Prior Therapy none      Precautions   Precautions None      Restrictions   Weight Bearing Restrictions No      Prior Function   Level of Independence Independent    Vocation Full time employment    Vocation Requirements waitress      AROM   Lumbar Flexion full    Lumbar Extension full    Lumbar - Left Side Bend full      Strength   Right Hip Extension 5/5    Right Hip External Rotation  5/5    Right Hip ABduction 5/5    Right Hip ADduction 5/5    Left Hip Extension 5/5    Left Hip External Rotation 5/5    Left Hip ABduction 5/5      Palpation   SI assessment  Pelvis in correct alignment                         [facility] Adult PT Treatment/Exercise - 08/19/20 0001      Lumbar Exercises: Stretches   Active Hamstring Stretch Right;Left;2 reps;30 seconds  Active Hamstring Stretch Limitations supine with strap    Double Knee to Chest Stretch 1 rep;30 seconds    Press Ups 5 reps    Quadruped Mid Back Stretch 3 reps;30 seconds    Quadruped Mid Back Stretch Limitations all three ways childs pose    Other Lumbar Stretch Exercise thread the needle holding 30 seconds each way      Manual Therapy   Manual Therapy Soft tissue mobilization;Myofascial release    Soft tissue mobilization to lumbar and low thoracic paraspinals    Myofascial Release tissue rolling the the lumbar and thoracic                    PT Short Term Goals - 05/27/20 1601      PT SHORT TERM GOAL #1   Title independent with initial HEP    Time 4    Period Weeks    Status Achieved             PT Long Term Goals - 08/19/20 1441      PT LONG TERM GOAL #1   Title independent with advanced HEP for core stability and reduction of pain    Time 12    Period Weeks    Status Achieved      PT LONG TERM GOAL #2   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 due to centralization of the pain    Time  12    Period Weeks    Status Achieved      PT LONG TERM GOAL #3   Title able to sleep through the night without pain waking her up    Time 12    Period Weeks    Status Achieved      PT LONG TERM GOAL #4   Title bilateral hip strength for abduction and ER >/= 4/5 so she is able to squat and pick up her 80 month old with right back and lower extremity pain at level </= 2/10    Baseline Strength has increased to 5/5 but pain increase to 6/10  after last flare-up    Time 12    Period Weeks    Status Partially Met      PT LONG TERM GOAL #5   Title understand correct body mechanics with daily and work tasks to reduce the strain on her lumbar spine    Time 12    Period Weeks    Status Achieved                 Plan - 08/19/20 1453    Clinical Impression Statement Patient is not having the radiating pain down her leg anymore. Patient has full lumbar ROM. She has 5/5 hip strength. Patient has been able to sleep through the night this past week. She has increased back pain at work and had increased pain last night. Her pelvis in correct alignment. Patient has been advised to find a primary care doctor to assess her back further. Patient is independent with her HEP.    Personal Factors and Comorbidities Profession    Examination-Activity Limitations Bed Mobility;Sleep;Stairs;Locomotion Level    Examination-Participation Restrictions Occupation;Community Activity    Stability/Clinical Decision Making Stable/Uncomplicated    Rehab Potential Excellent    PT Treatment/Interventions ADLs/Self Care Home Management;Cryotherapy;Electrical Stimulation;Moist Heat;Ultrasound;Therapeutic activities;Therapeutic exercise;Manual techniques;Patient/family education;Neuromuscular re-education;Dry needling;Spinal Manipulations    PT Next Visit Plan Discharge to HEP, advised patient to find a  primary care doctor to assess her back further    PT Home Exercise Plan Access Code: [email or web address]    Recommended Other  Services MD signed all notes    Consulted and Agree with Plan of Care Patient           Patient will benefit from skilled therapeutic intervention in order to improve the following deficits and impairments:  Decreased activity tolerance, Decreased strength, Pain, Increased muscle spasms, Decreased range of motion  Visit Diagnosis: Muscle weakness (generalized)  Chronic right-sided low back pain with right-sided sciatica     Problem List Patient Active Problem List   Diagnosis Date Noted  . Sciatic nerve pain 07/27/2018  . Sickle cell trait ([place]) 02/15/2018    Earlie Counts, PT 08/19/20 2:59 PM   [place] at [facility] [address] [place], [place], [address] Phone: [phone]   Fax:  [phone]  Name: Charles Niese MRN: [id number] Date of Birth: 10/18/1995  PHYSICAL THERAPY DISCHARGE SUMMARY  Visits from Start of Care: 10  Current functional level related to goals / functional outcomes: See above.    Remaining deficits: See above.    Education / Equipment: HEP  Plan: Patient agrees to discharge.  Patient goals were partially met. Patient is being discharged due to                                                    reached maximal potential in physical therapy at this time and should be reassessed by MD for her back pain. Thank you for the referral. Earlie Counts, PT 08/19/20 3:00 PM    ?????

## 2020-08-20 ENCOUNTER — Encounter: Payer: Self-pay | Admitting: General Practice

## 2020-08-26 ENCOUNTER — Encounter: Payer: Self-pay | Admitting: Physical Therapy

## 2020-09-02 ENCOUNTER — Encounter: Payer: Self-pay | Admitting: Physical Therapy

## 2020-09-04 ENCOUNTER — Emergency Department (HOSPITAL_COMMUNITY)
Admission: EM | Admit: 2020-09-04 | Discharge: 2020-09-04 | Disposition: A | Discharge status: Home / Self Care | Payer: Medicaid Other | Attending: Emergency Medicine | Admitting: Emergency Medicine

## 2020-09-04 ENCOUNTER — Encounter (HOSPITAL_COMMUNITY): Payer: Self-pay | Admitting: *Deleted

## 2020-09-04 ENCOUNTER — Other Ambulatory Visit: Payer: Self-pay

## 2020-09-04 DIAGNOSIS — Y9301 Activity, walking, marching and hiking: Secondary | ICD-10-CM | POA: Insufficient documentation

## 2020-09-04 DIAGNOSIS — M79671 Pain in right foot: Secondary | ICD-10-CM | POA: Insufficient documentation

## 2020-09-04 DIAGNOSIS — M545 Low back pain, unspecified: Secondary | ICD-10-CM | POA: Insufficient documentation

## 2020-09-04 DIAGNOSIS — W109XXA Fall (on) (from) unspecified stairs and steps, initial encounter: Secondary | ICD-10-CM | POA: Diagnosis not present

## 2020-09-04 DIAGNOSIS — Z5321 Procedure and treatment not carried out due to patient leaving prior to being seen by health care provider: Secondary | ICD-10-CM | POA: Diagnosis not present

## 2020-09-04 NOTE — ED Notes (Signed)
Pt left without being seen. Pt told risk of leaving

## 2020-09-04 NOTE — ED Triage Notes (Signed)
States she was walking down some stairs yest and states she lost he balance and fell, c/o pain in her right foot, leg and lower back. States the pain was worse today.

## 2020-09-05 ENCOUNTER — Emergency Department (HOSPITAL_COMMUNITY): Payer: Medicaid Other

## 2020-09-05 ENCOUNTER — Encounter (HOSPITAL_COMMUNITY): Payer: Self-pay | Admitting: Emergency Medicine

## 2020-09-05 ENCOUNTER — Inpatient Hospital Stay (HOSPITAL_COMMUNITY)
Admission: EM | Admit: 2020-09-05 | Discharge: 2020-09-05 | Disposition: A | Discharge status: Home / Self Care | Payer: Medicaid Other | Attending: Obstetrics and Gynecology | Admitting: Obstetrics and Gynecology

## 2020-09-05 ENCOUNTER — Other Ambulatory Visit: Payer: Self-pay

## 2020-09-05 DIAGNOSIS — M79671 Pain in right foot: Secondary | ICD-10-CM | POA: Diagnosis not present

## 2020-09-05 DIAGNOSIS — O210 Mild hyperemesis gravidarum: Secondary | ICD-10-CM

## 2020-09-05 DIAGNOSIS — Z20822 Contact with and (suspected) exposure to covid-19: Secondary | ICD-10-CM | POA: Diagnosis not present

## 2020-09-05 DIAGNOSIS — W109XXA Fall (on) (from) unspecified stairs and steps, initial encounter: Secondary | ICD-10-CM | POA: Diagnosis not present

## 2020-09-05 DIAGNOSIS — O468X1 Other antepartum hemorrhage, first trimester: Secondary | ICD-10-CM

## 2020-09-05 DIAGNOSIS — Z87891 Personal history of nicotine dependence: Secondary | ICD-10-CM | POA: Insufficient documentation

## 2020-09-05 DIAGNOSIS — Z3A01 Less than 8 weeks gestation of pregnancy: Secondary | ICD-10-CM | POA: Diagnosis not present

## 2020-09-05 DIAGNOSIS — O9A211 Injury, poisoning and certain other consequences of external causes complicating pregnancy, first trimester: Secondary | ICD-10-CM | POA: Diagnosis present

## 2020-09-05 DIAGNOSIS — R112 Nausea with vomiting, unspecified: Secondary | ICD-10-CM | POA: Insufficient documentation

## 2020-09-05 DIAGNOSIS — Z9851 Tubal ligation status: Secondary | ICD-10-CM | POA: Diagnosis not present

## 2020-09-05 DIAGNOSIS — Z3490 Encounter for supervision of normal pregnancy, unspecified, unspecified trimester: Secondary | ICD-10-CM

## 2020-09-05 LAB — COMPREHENSIVE METABOLIC PANEL
ALT: 12 U/L (ref 0–44)
AST: 14 U/L — ABNORMAL LOW (ref 15–41)
Albumin: 3.8 g/dL (ref 3.5–5.0)
Alkaline Phosphatase: 61 U/L (ref 38–126)
Anion gap: 6 (ref 5–15)
BUN: 5 mg/dL — ABNORMAL LOW (ref 6–20)
CO2: 22 mmol/L (ref 22–32)
Calcium: 9.2 mg/dL (ref 8.9–10.3)
Chloride: 108 mmol/L (ref 98–111)
Creatinine, Ser: 0.82 mg/dL (ref 0.44–1.00)
GFR, Estimated: >60 mL/min (ref >60)
Glucose, Bld: 82 mg/dL (ref 70–99)
Potassium: 3.7 mmol/L (ref 3.5–5.1)
Sodium: 136 mmol/L (ref 135–145)
Total Bilirubin: 2.1 mg/dL — ABNORMAL HIGH (ref 0.3–1.2)
Total Protein: 6.3 g/dL — ABNORMAL LOW (ref 6.5–8.1)

## 2020-09-05 LAB — CBC WITH DIFFERENTIAL/PLATELET
Abs Immature Granulocytes: 0.02 10*3/uL (ref 0.00–0.07)
Basophils Absolute: 0 10*3/uL (ref 0.0–0.1)
Basophils Relative: 0 %
Eosinophils Absolute: 0.1 10*3/uL (ref 0.0–0.5)
Eosinophils Relative: 1 %
HCT: 38.7 % (ref 36.0–46.0)
Hemoglobin: 13.6 g/dL (ref 12.0–15.0)
Immature Granulocytes: 0 %
Lymphocytes Relative: 14 %
Lymphs Abs: 0.8 10*3/uL (ref 0.7–4.0)
MCH: 30.6 pg (ref 26.0–34.0)
MCHC: 35.1 g/dL (ref 30.0–36.0)
MCV: 87 fL (ref 80.0–100.0)
Monocytes Absolute: 0.3 10*3/uL (ref 0.1–1.0)
Monocytes Relative: 6 %
Neutro Abs: 4.5 10*3/uL (ref 1.7–7.7)
Neutrophils Relative %: 79 %
Platelets: 161 10*3/uL (ref 150–400)
RBC: 4.45 MIL/uL (ref 3.87–5.11)
RDW: 13.9 % (ref 11.5–15.5)
WBC: 5.7 10*3/uL (ref 4.0–10.5)
nRBC: 0 % (ref 0.0–0.2)

## 2020-09-05 LAB — URINALYSIS, ROUTINE W REFLEX MICROSCOPIC
Bilirubin Urine: NEGATIVE
Glucose, UA: NEGATIVE mg/dL
Hgb urine dipstick: NEGATIVE
Ketones, ur: 5 mg/dL — AB
Leukocytes,Ua: NEGATIVE
Nitrite: NEGATIVE
Protein, ur: NEGATIVE mg/dL
Specific Gravity, Urine: 1.012 (ref 1.005–1.030)
pH: 6 (ref 5.0–8.0)

## 2020-09-05 LAB — RESP PANEL BY RT-PCR (FLU A&B, COVID) ARPGX2
Influenza A by PCR: NEGATIVE
Influenza B by PCR: NEGATIVE
SARS Coronavirus 2 by RT PCR: NEGATIVE

## 2020-09-05 LAB — I-STAT BETA HCG BLOOD, ED (MC, WL, AP ONLY): I-stat hCG, quantitative: >2000 m[IU]/mL — ABNORMAL HIGH (ref <5)

## 2020-09-05 LAB — HCG, QUANTITATIVE, PREGNANCY: hCG, Beta Chain, Quant, S: 26798 m[IU]/mL — ABNORMAL HIGH (ref <5)

## 2020-09-05 LAB — LIPASE, BLOOD: Lipase: 20 U/L (ref 11–51)

## 2020-09-05 MED ORDER — PRENATAL PLUS 27-1 MG PO TABS: 1.0000 | ORAL_TABLET | Freq: Every day | ORAL | 2 refills | Status: DC

## 2020-09-05 MED ORDER — ONDANSETRON 4 MG PO TBDP
4.0000 mg | ORAL_TABLET | Freq: Once | ORAL | Status: AC
  Administered 2020-09-05: 4 mg via ORAL
  Filled 2020-09-05: qty 1

## 2020-09-05 MED ORDER — DOXYLAMINE-PYRIDOXINE 10-10 MG PO TBEC: 2.0000 | DELAYED_RELEASE_TABLET | Freq: Every day | ORAL | 2 refills | Status: DC

## 2020-09-05 NOTE — ED Notes (Signed)
X-ray brought pt back without x-ray.  They requested that we call her when

## 2020-09-05 NOTE — MAU Note (Addendum)
Pt had positive pregnancy test at home mid November 2021. Pt reports having negative pregnancy test at [facility]. She reports knowing she had her LMP in August, unsure about September, and definitely knows she did not have one November or December. Pt came to ED to assess her ankle because she fell down stairs. Pt mentioned N/V and abd pain in ED. Nausea started 2 weeks ago and the severe N/V started 09/04/2020. She reports being unable to keep solids or liquids down. Pt denies vaginal bleeding. Pt reports cramping started a week ago, 7/10 pain. Pt had BTL in June 2021.

## 2020-09-05 NOTE — MAU Provider Note (Signed)
Event Date/Time   First Provider Initiated Contact with Patient 09/05/20 1348      S Shelby Serrano is a 24 y.o. [id number] patient who presents to MAU s/p transfer from [facility].  Patient with N/V, but took Zofran in the main ED and is tolerating ginger ale while speaking with provider.  Patient also reports some intermittent abdominal cramping that started one week ago, but none currently. Patient with known PP BTL in June.   O BP 114/63   Pulse (!) 52   Temp 97.8 F (36.6 C) (Oral)   Resp 18   Ht 5\' 1"  (1.549 m)   Wt 49.9 kg   SpO2 97%   BMI 20.78 kg/m    Results for orders placed or performed during the hospital encounter of 09/05/20 (from the past 24 hour(s))  CBC with Differential/Platelet     Status: None   Collection Time: 09/05/20 10:21 AM  Result Value Ref Range   WBC 5.7 4.0 - 10.5 K/uL   RBC 4.45 3.87 - 5.11 MIL/uL   Hemoglobin 13.6 12.0 - 15.0 g/dL   HCT 09/07/20 50.5 - 39.7 %   MCV 87.0 80.0 - 100.0 fL   MCH 30.6 26.0 - 34.0 pg   MCHC 35.1 30.0 - 36.0 g/dL   RDW 67.3 41.9 - 37.9 %   Platelets 161 150 - 400 K/uL   nRBC 0.0 0.0 - 0.2 %   Neutrophils Relative % 79 %   Neutro Abs 4.5 1.7 - 7.7 K/uL   Lymphocytes Relative 14 %   Lymphs Abs 0.8 0.7 - 4.0 K/uL   Monocytes Relative 6 %   Monocytes Absolute 0.3 0.1 - 1.0 K/uL   Eosinophils Relative 1 %   Eosinophils Absolute 0.1 0.0 - 0.5 K/uL   Basophils Relative 0 %   Basophils Absolute 0.0 0.0 - 0.1 K/uL   Immature Granulocytes 0 %   Abs Immature Granulocytes 0.02 0.00 - 0.07 K/uL  Comprehensive metabolic panel     Status: Abnormal   Collection Time: 09/05/20 10:21 AM  Result Value Ref Range   Sodium 136 135 - 145 mmol/L   Potassium 3.7 3.5 - 5.1 mmol/L   Chloride 108 98 - 111 mmol/L   CO2 22 22 - 32 mmol/L   Glucose, Bld 82 70 - 99 mg/dL   BUN 5 (L) 6 - 20 mg/dL   Creatinine, Ser 09/07/20 0.44 - 1.00 mg/dL   Calcium 9.2 8.9 - 0.97 mg/dL   Total Protein 6.3 (L) 6.5 - 8.1 g/dL   Albumin 3.8 3.5 - 5.0 g/dL    AST 14 (L) 15 - 41 U/L   ALT 12 0 - 44 U/L   Alkaline Phosphatase 61 38 - 126 U/L   Total Bilirubin 2.1 (H) 0.3 - 1.2 mg/dL   GFR, Estimated 35.3 >29 mL/min   Anion gap 6 5 - 15  Lipase, blood     Status: None   Collection Time: 09/05/20 10:21 AM  Result Value Ref Range   Lipase 20 11 - 51 U/L  hCG, quantitative, pregnancy     Status: Abnormal   Collection Time: 09/05/20 10:31 AM  Result Value Ref Range   hCG, Beta Chain, Quant, S 26,798 (H) <5 mIU/mL  I-Stat Beta hCG blood, ED ([facility], [facility], [facility] only)     Status: Abnormal   Collection Time: 09/05/20 10:33 AM  Result Value Ref Range   I-stat hCG, quantitative >2,000.0 (H) <5 mIU/mL   Comment 3  Resp Panel by RT-PCR (Flu A&B, Covid) Nasopharyngeal Swab     Status: None   Collection Time: 09/05/20 12:09 PM   Specimen: Nasopharyngeal Swab; Nasopharyngeal(NP) swabs in vial transport medium  Result Value Ref Range   SARS Coronavirus 2 by RT PCR NEGATIVE NEGATIVE   Influenza A by PCR NEGATIVE NEGATIVE   Influenza B by PCR NEGATIVE NEGATIVE  Urinalysis, Routine w reflex microscopic Urine, Clean Catch     Status: Abnormal   Collection Time: 09/05/20  1:01 PM  Result Value Ref Range   Color, Urine YELLOW YELLOW   APPearance CLEAR CLEAR   Specific Gravity, Urine 1.012 1.005 - 1.030   pH 6.0 5.0 - 8.0   Glucose, UA NEGATIVE NEGATIVE mg/dL   Hgb urine dipstick NEGATIVE NEGATIVE   Bilirubin Urine NEGATIVE NEGATIVE   Ketones, ur 5 (A) NEGATIVE mg/dL   Protein, ur NEGATIVE NEGATIVE mg/dL   Nitrite NEGATIVE NEGATIVE   Leukocytes,Ua NEGATIVE NEGATIVE   DG Tibia/Fibula Right  Result Date: 09/05/2020 CLINICAL DATA:  Fall 2 days ago with lower leg pain, initial encounter EXAM: RIGHT TIBIA AND FIBULA - 2 VIEW COMPARISON:  None. FINDINGS: There is no evidence of fracture or other focal bone lesions. Soft tissues are unremarkable. IMPRESSION: No acute abnormality noted. Electronically Signed   By: Alcide Clever M.D.   On: 09/05/2020 11:47    DG Foot Complete Right  Result Date: 09/05/2020 CLINICAL DATA:  Recent fall with foot pain, initial encounter EXAM: RIGHT FOOT COMPLETE - 3+ VIEW COMPARISON:  None. FINDINGS: There is no evidence of fracture or dislocation. There is no evidence of arthropathy or other focal bone abnormality. Soft tissues are unremarkable. IMPRESSION: No acute abnormality noted. Electronically Signed   By: Alcide Clever M.D.   On: 09/05/2020 11:48   US OB LESS THAN 14 WEEKS WITH OB TRANSVAGINAL  Result Date: 09/05/2020 CLINICAL DATA:  Pregnancy. History of tubal ligation in June of 2021 EXAM: OBSTETRIC <14 WK [place] AND TRANSVAGINAL OB US TECHNIQUE: Both transabdominal and transvaginal ultrasound examinations were performed for complete evaluation of the gestation as well as the maternal uterus, adnexal regions, and pelvic cul-de-sac. Transvaginal technique was performed to assess early pregnancy. COMPARISON:  None. FINDINGS: Intrauterine gestational sac: Single Yolk sac:  Visualized. Embryo:  Visualized. Cardiac Activity: Visualized. Heart Rate: 127 bpm CRL:  7.1 mm   6 w   4 d                  [place] EDC: 04/27/2021 Subchorionic hemorrhage:  Small-moderate. Maternal uterus/adnexae: Unremarkable right ovary. Probable left ovarian corpus luteal cyst. Trace fluid within the cul-de-sac. IMPRESSION: 1. Single live intrauterine gestation measuring 6 weeks 4 days by crown-rump length. 2. Active embryonic heart tones at 127 bpm. 3. Small-moderate-sized subchorionic hemorrhage. Electronically Signed   By: Duanne Guess D.O.   On: 09/05/2020 12:26     Physical Exam Constitutional:      General: She is not in acute distress.    Appearance: Normal appearance.  HENT:     Head: Normocephalic and atraumatic.  Eyes:     Conjunctiva/sclera: Conjunctivae normal.  Cardiovascular:     Rate and Rhythm: Normal rate.  Pulmonary:     Effort: Pulmonary effort is normal.  Musculoskeletal:        General: Normal range of motion.      Cervical back: Normal range of motion.  Skin:    General: Skin is warm and dry.  Neurological:     Mental Status:  She is alert and oriented to person, place, and time.  Psychiatric:        Mood and Affect: Mood normal.        Behavior: Behavior normal.        Thought Content: Thought content normal.     A S/P BTL SIUP at 6.4 weeks SCH  P -TVUS ordered and completed in [facility]. -Provider informed patient of results who is appropriately upset. -Questions regarding how she is pregnant addressed.  Informed of surgical procedure with Filshie Clips and how this is not a "tying," but an occlusion method that is used on woman of younger age as reversal rates are higher.  -Patient and mother (via facetime) verbalizes understanding. -Educated on [facility], what to expect including bleeding, risks for miscarriage, and resolution.  -Patient given script for Diclegis and PNV.  Encouraged to start both today. -Will send message to [facility] for scheduling of PN intake/appt. -Encouraged to call or return to MAU if symptoms worsen or with the onset of new symptoms. -Discharged to home in stable condition.  Gerrit Heck, CNM 09/05/2020 1:55 PM

## 2020-09-05 NOTE — ED Triage Notes (Signed)
Pt arrives to ED with c/o right foot pain x2 days after falling down some stairs while wearing high heels. Pt states yesterday she started to become nauseous.

## 2020-09-05 NOTE — ED Notes (Signed)
Report given to French Ana, [place] RN

## 2020-09-05 NOTE — ED Provider Notes (Signed)
[facility] EMERGENCY DEPARTMENT Provider Note   CSN: [id number] Arrival date & time: 09/05/20  5993     History Chief Complaint  Patient presents with  . Foot Pain  . Nausea    Shelby Serrano is a 24 y.o. female.  HPI   Patient presented to the ED for evaluation of pain associated with a recent fall.  Patient states she fell down the stairs a couple days ago because she slipped wearing heels.  Patient has been having pain in her back that goes down her leg.  She also has pain in her foot and lower leg.  It hurts to palpate her foot.  Walking causes increased pain.  Patient does have history of sciatica.  Patient denies any focal numbness or weakness.  Patient also complains of feeling nauseated for the last several days.  She has had episodes of vomiting when she tries to eat or drink.  She is not having any abdominal pain.  She denies any diarrhea.  Patient has had a tubal ligation.  She does not think she is pregnant.  She denies any cough.  No sore throat.  No fevers but she would also like to get tested for Covid she has not been vaccinated  Past Medical History:  Diagnosis Date  . Sickle cell trait [facility])     Patient Active Problem List   Diagnosis Date Noted  . Sciatic nerve pain 07/27/2018  . Sickle cell trait (HCC) 02/15/2018    Past Surgical History:  Procedure Laterality Date  . TUBAL LIGATION Bilateral 02/18/2020   Procedure: POST PARTUM TUBAL LIGATION;  Surgeon: [place] Bing, MD;  Location: [facility] LD [facility]S;  Service: Gynecology;  Laterality: Bilateral;     OB History    Gravida  7   Para  4   Term  4   Preterm  0   AB  3   Living  4     SAB  3   IAB  0   Ectopic  0   Multiple  0   Live Births  4           Family History  Problem Relation Age of Onset  . Diabetes Mother   . Hypertension Mother     Social History   Tobacco Use  . Smoking status: Former Smoker    Packs/day: 1.00    Types: Cigarettes    Quit  date: 02/08/2016    Years since quitting: 4.5  . Smokeless tobacco: Never Used  Vaping Use  . Vaping Use: Never used  Substance Use Topics  . Alcohol use: No  . Drug use: Not Currently    Types: Marijuana    Comment: Patient denies, Positive UDS 05/04/16 [facility] ED    Home Medications Prior to Admission medications   Medication Sig Start Date End Date Taking? Authorizing Provider  acetaminophen (TYLENOL) 325 MG tablet Take 2 tablets (650 mg total) by mouth every 6 (six) hours as needed (for pain scale < 4). 02/19/20   [place] Bing, MD  ibuprofen (ADVIL) 600 MG tablet Take 1 tablet (600 mg total) by mouth every 8 (eight) hours as needed for mild pain. 02/19/20   [place] Bing, MD    Allergies    Patient has no known allergies.  Review of Systems   Review of Systems  All other systems reviewed and are negative.   Physical Exam Updated Vital Signs BP (!) 129/97 (BP Location: Left Arm)   Pulse 68  Temp 97.8 F (36.6 C) (Oral)   Resp 18   Ht 1.549 m (5\' 1" )   Wt 49.9 kg   SpO2 100%   BMI 20.78 kg/m   Physical Exam Vitals and nursing note reviewed.  Constitutional:      General: She is not in acute distress.    Appearance: She is well-developed and well-nourished.  HENT:     Head: Normocephalic and atraumatic.     Right Ear: External ear normal.     Left Ear: External ear normal.  Eyes:     General: No scleral icterus.       Right eye: No discharge.        Left eye: No discharge.     Conjunctiva/sclera: Conjunctivae normal.  Neck:     Trachea: No tracheal deviation.  Cardiovascular:     Rate and Rhythm: Normal rate and regular rhythm.     Pulses: Intact distal pulses.  Pulmonary:     Effort: Pulmonary effort is normal. No respiratory distress.     Breath sounds: Normal breath sounds. No stridor. No wheezing or rales.  Abdominal:     General: Bowel sounds are normal. There is no distension.     Palpations: Abdomen is soft.     Tenderness: There is no abdominal  tenderness. There is no guarding or rebound.  Musculoskeletal:        General: Tenderness present. No deformity or edema.     Cervical back: Neck supple.     Comments: Tenderness palpation lumbar spine, no deformity, no edema, tenderness palpation right foot and right lower leg, no edema or deformity  Skin:    General: Skin is warm and dry.     Findings: No rash.  Neurological:     Mental Status: She is alert.     Cranial Nerves: No cranial nerve deficit (no facial droop, extraocular movements intact, no slurred speech).     Sensory: No sensory deficit.     Motor: No abnormal muscle tone or seizure activity.     Coordination: Coordination normal.     Deep Tendon Reflexes: Strength normal.  Psychiatric:        Mood and Affect: Mood and affect normal.     ED Results / Procedures / Treatments   Labs (all labs ordered are listed, but only abnormal results are displayed) Labs Reviewed  COMPREHENSIVE METABOLIC PANEL - Abnormal; Notable for the following components:      Result Value   BUN 5 (*)    Total Protein 6.3 (*)    AST 14 (*)    Total Bilirubin 2.1 (*)    All other components within normal limits  I-STAT BETA HCG BLOOD, ED ([facility], [facility], [facility] ONLY) - Abnormal; Notable for the following components:   I-stat hCG, quantitative >2,000.0 (*)    All other components within normal limits  RESP PANEL BY RT-PCR (FLU A&B, COVID) ARPGX2  CBC WITH DIFFERENTIAL/PLATELET  LIPASE, BLOOD  URINALYSIS, ROUTINE W REFLEX MICROSCOPIC  HCG, QUANTITATIVE, PREGNANCY    EKG None  Radiology DG Tibia/Fibula Right  Result Date: 09/05/2020 CLINICAL DATA:  Fall 2 days ago with lower leg pain, initial encounter EXAM: RIGHT TIBIA AND FIBULA - 2 VIEW COMPARISON:  None. FINDINGS: There is no evidence of fracture or other focal bone lesions. Soft tissues are unremarkable. IMPRESSION: No acute abnormality noted. Electronically Signed   By: 09/07/2020 M.D.   On: 09/05/2020 11:47   DG Foot Complete  Right  Result Date: 09/05/2020  CLINICAL DATA:  Recent fall with foot pain, initial encounter EXAM: RIGHT FOOT COMPLETE - 3+ VIEW COMPARISON:  None. FINDINGS: There is no evidence of fracture or dislocation. There is no evidence of arthropathy or other focal bone abnormality. Soft tissues are unremarkable. IMPRESSION: No acute abnormality noted. Electronically Signed   By: Alcide Clever M.D.   On: 09/05/2020 11:48    Procedures Procedures (including critical care time)  Medications Ordered in ED Medications  ondansetron (ZOFRAN-ODT) disintegrating tablet 4 mg (4 mg Oral Given 09/05/20 1021)    ED Course  I have reviewed the triage vital signs and the nursing notes.  Pertinent labs & imaging results that were available during my care of the patient were reviewed by me and considered in my medical decision making (see chart for details).  Clinical Course as of 09/05/20 1209  Fri Sep 05, 2020  1101 Records show post partum tubal ligation in June of this year. [JK]  1119 Discussed with MAU APP Melissa.  Will send pt to MAU [JK]  1209 X-rays negative without acute process [JK]    Clinical Course User Index [JK] Linwood Dibbles, MD   MDM Rules/Calculators/A&P                          Patient's x-rays do not show any signs of serious injury.  Consistent with soft tissue sprain strain.  Patient had been complaining of nausea and vomiting.  Patient's pregnancy test is positive.  She actually had a tubal ligation earlier this year.  We will proceed with a pelvic ultrasound and have the patient transferred to the maternity admission unit for further evaluation. Final Clinical Impression(s) / ED Diagnoses Final diagnoses:  History of tubal ligation  Pregnant    Rx / DC Orders ED Discharge Orders    None       Linwood Dibbles, MD 09/05/20 1210

## 2020-09-05 NOTE — ED Notes (Signed)
Pt taken to xray 

## 2020-09-05 NOTE — ED Notes (Signed)
Extra light green and lavender drawn on pt, sent to main lab

## 2020-09-09 ENCOUNTER — Encounter: Payer: Medicaid Other | Admitting: Physical Therapy

## 2020-09-09 ENCOUNTER — Encounter: Payer: Self-pay | Admitting: Obstetrics and Gynecology

## 2020-09-13 NOTE — L&D Delivery Note (Signed)
OB/GYN Faculty Practice Delivery Note  Breslin Burklow is a 25 y.o. [id number] s/p vaginal delivery at [redacted]w[redacted]d. She was admitted for preterm labor in the setting of vaginal bleeding for several weeks. Known large subchorionic hemorrhage on ultrasound at [redacted]w[redacted]d.  ROM: 0h 74m with brown fluid GBS Status: unknown; no antibiotics prior to delivery given precipitous nature of delivery Maximum Maternal Temperature: 96.108F  Labor Progress: Dr. Alysia Penna, Thereasa Parkin and NICU team were immediately notified of patient, upon her arrival to MAU. On arrival to L&D, pt had complete cervical dilation with a bulging bag. She then delivered precipitously as noted below.  Delivery Date/Time: 01/10/21 at 2044 Delivery: Called to room and patient was complete and pushing. Delivered in footling breech position, en caul. Membranes spontaneously ruptured s/p delivery. Placenta immediately delivered spontaneously without need for cord traction. Infant with spontaneous cry and moving all extremities at time of delivery. Given preterm, baby was immediately brought with placenta to warmer by Dr. Alysia Penna. Cord clamped x 2 and cut by Dr. Alysia Penna immediately s/p baby's transfer to the warmer. The NICU team arrived seconds later for resuscitation of newborn. Unable to collect cord gas and cord blood given lack of blood in the cord. Fundus firm with massage and Pitocin. Labia, perineum, vagina, and cervix were inspected, without evidence of lacerations.   Placenta: intact, sent to pathology Complications: precipitous preterm delivery in setting of vaginal bleeding x2 weeks Lacerations: none EBL: 50 ml (also evidence of old clots with delivery of placenta) Analgesia: IV fentanyl s/p delivery  Infant: viable female  APGARs 7 & 9  weight 770g  Lynnda Shields, MD OB/GYN Fellow, Faculty Practice

## 2020-09-25 ENCOUNTER — Telehealth: Payer: Medicaid Other

## 2020-09-26 ENCOUNTER — Encounter (HOSPITAL_COMMUNITY): Payer: Self-pay | Admitting: Obstetrics and Gynecology

## 2020-09-26 ENCOUNTER — Inpatient Hospital Stay (HOSPITAL_COMMUNITY): Payer: Medicaid Other

## 2020-09-26 ENCOUNTER — Inpatient Hospital Stay (HOSPITAL_COMMUNITY)
Admission: AD | Admit: 2020-09-26 | Discharge: 2020-09-26 | Disposition: A | Discharge status: Home / Self Care | Payer: Medicaid Other | Attending: Obstetrics and Gynecology | Admitting: Obstetrics and Gynecology

## 2020-09-26 ENCOUNTER — Other Ambulatory Visit: Payer: Self-pay

## 2020-09-26 DIAGNOSIS — O209 Hemorrhage in early pregnancy, unspecified: Secondary | ICD-10-CM

## 2020-09-26 DIAGNOSIS — R109 Unspecified abdominal pain: Secondary | ICD-10-CM

## 2020-09-26 DIAGNOSIS — O208 Other hemorrhage in early pregnancy: Secondary | ICD-10-CM | POA: Diagnosis not present

## 2020-09-26 DIAGNOSIS — O99331 Smoking (tobacco) complicating pregnancy, first trimester: Secondary | ICD-10-CM | POA: Diagnosis not present

## 2020-09-26 DIAGNOSIS — O26891 Other specified pregnancy related conditions, first trimester: Secondary | ICD-10-CM

## 2020-09-26 DIAGNOSIS — Z3A09 9 weeks gestation of pregnancy: Secondary | ICD-10-CM | POA: Diagnosis not present

## 2020-09-26 DIAGNOSIS — F17210 Nicotine dependence, cigarettes, uncomplicated: Secondary | ICD-10-CM | POA: Insufficient documentation

## 2020-09-26 DIAGNOSIS — O418X10 Other specified disorders of amniotic fluid and membranes, first trimester, not applicable or unspecified: Secondary | ICD-10-CM

## 2020-09-26 DIAGNOSIS — O468X1 Other antepartum hemorrhage, first trimester: Secondary | ICD-10-CM | POA: Diagnosis not present

## 2020-09-26 NOTE — MAU Note (Signed)
Pt reports onset of left lower abd pain 2 days ago, onset of vaginal bleeding at [place] tonight.

## 2020-09-26 NOTE — MAU Provider Note (Signed)
Chief Complaint: Abdominal Pain and Vaginal Bleeding   Event Date/Time   First Provider Initiated Contact with Patient 09/26/20 0200      SUBJECTIVE HPI: Shelby Serrano is a 25 y.o. [id number] at [redacted]w[redacted]d with IUP by early ultrasound who is pregnant s/p BTL who presents to maternity admissions reporting onset of cramping 2 days ago and heavy vaginal bleeding today.   Location: lower abdomen Quality: cramping Severity: 7/10 on pain scale Duration: 2 days Timing: intermittent Modifying factors: none Associated signs and symptoms: vaginal bleeding  HPI  Past Medical History:  Diagnosis Date  . Sickle cell trait [facility])    Past Surgical History:  Procedure Laterality Date  . TUBAL LIGATION Bilateral 02/18/2020   Procedure: POST PARTUM TUBAL LIGATION;  Surgeon: Smoaks Bing, MD;  Location: [facility] LD ORS;  Service: Gynecology;  Laterality: Bilateral;  . TUBAL LIGATION     Social History   Socioeconomic History  . Marital status: Single    Spouse name: Not on file  . Number of children: Not on file  . Years of education: Not on file  . Highest education level: Not on file  Occupational History  . Not on file  Tobacco Use  . Smoking status: Current Every Day Smoker    Packs/day: 1.00    Types: Cigarettes    Last attempt to quit: 02/08/2016    Years since quitting: 4.6  . Smokeless tobacco: Never Used  Vaping Use  . Vaping Use: Never used  Substance and Sexual Activity  . Alcohol use: No  . Drug use: Not Currently    Types: Marijuana    Comment: Patient denies, Positive UDS 05/04/16 [facility] ED  . Sexual activity: Yes  Other Topics Concern  . Not on file  Social History Narrative  . Not on file   Social Determinants of Health   Financial Resource Strain: Not on file  Food Insecurity: No Food Insecurity  . Worried About Programme researcher, broadcasting/film/video in the Last Year: Never true  . Ran Out of Food in the Last Year: Never true  Transportation Needs: No Transportation Needs  . Lack of  Transportation (Medical): No  . Lack of Transportation (Non-Medical): No  Physical Activity: Not on file  Stress: Not on file  Social Connections: Not on file  Intimate Partner Violence: Not on file   No current facility-administered medications on file prior to encounter.   Current Outpatient Medications on File Prior to Encounter  Medication Sig Dispense Refill  . acetaminophen (TYLENOL) 325 MG tablet Take 2 tablets (650 mg total) by mouth every 6 (six) hours as needed (for pain scale < 4). 30 tablet 0  . Doxylamine-Pyridoxine 10-10 MG TBEC Take 2 tablets by mouth at bedtime. 60 tablet 2  . prenatal vitamin w/FE, FA (PRENATAL 1 + 1) 27-1 MG TABS tablet Take 1 tablet by mouth daily at 12 noon. 90 tablet 2   No Known Allergies  ROS:  Review of Systems  Constitutional: Negative for chills, fatigue and fever.  Respiratory: Negative for shortness of breath.   Cardiovascular: Negative for chest pain.  Gastrointestinal: Positive for abdominal pain. Negative for nausea and vomiting.  Genitourinary: Positive for vaginal bleeding. Negative for difficulty urinating, dysuria, flank pain, pelvic pain, vaginal discharge and vaginal pain.  Neurological: Negative for dizziness and headaches.  Psychiatric/Behavioral: Negative.      I have reviewed patient's Past Medical Hx, Surgical Hx, Family Hx, Social Hx, medications and allergies.   Physical Exam   Patient  Vitals for the past 24 hrs:  BP Temp Temp src Pulse Resp SpO2 Height Weight  09/26/20 0146 (!) 110/59 - - 71 - - - -  09/26/20 0133 104/64 98.6 F (37 C) - 69 15 100 % 5\' 1"  (1.549 m) 46.3 kg  09/26/20 0049 114/62 (!) 97.5 F (36.4 C) Oral 84 16 100 % - -   Constitutional: Well-developed, well-nourished female in no acute distress.  Cardiovascular: normal rate Respiratory: normal effort GI: Abd soft, non-tender. Pos BS x 4 MS: Extremities nontender, no edema, normal ROM Neurologic: Alert and oriented x 4.  GU: Neg  CVAT.  PELVIC EXAM: Deferred, pt to 09/28/20 before exam performed   LAB RESULTS No results found for this or any previous visit (from the past 24 hour(s)).  --/--/O POS, O POS Performed at [facility], [address], [place], [place]  [phone])  IMAGING DG Tibia/Fibula Right  Result Date: 09/05/2020 CLINICAL DATA:  Fall 2 days ago with lower leg pain, initial encounter EXAM: RIGHT TIBIA AND FIBULA - 2 VIEW COMPARISON:  None. FINDINGS: There is no evidence of fracture or other focal bone lesions. Soft tissues are unremarkable. IMPRESSION: No acute abnormality noted. Electronically Signed   By: 09/07/2020 M.D.   On: 09/05/2020 11:47   09/07/2020 OB Comp Less 14 Wks  Result Date: 09/26/2020 CLINICAL DATA:  Bleeding, left lower quadrant pain EXAM: OBSTETRIC <14 WK ULTRASOUND TECHNIQUE: Transabdominal ultrasound was performed for evaluation of the gestation as well as the maternal uterus and adnexal regions. COMPARISON:  09/05/2020 FINDINGS: Intrauterine gestational sac: Single Yolk sac:  Not Visualized. Embryo:  Visualized. Cardiac Activity: Visualized. Heart Rate: 171 bpm MSD:    mm    w     d CRL:   32.1 mm   10 w 1 d                  09/07/2020 EDC: 04/23/2021 Subchorionic hemorrhage: Small to moderate subchorionic hemorrhage, 3.1 x 2.5 x 0.8 cm. Maternal uterus/adnexae: No adnexal mass or free fluid. IMPRESSION: Ten week 1 day intrauterine pregnancy. Fetal heart rate 171 beats per minute. Small to moderate subchorionic hemorrhage. Electronically Signed   By: 06/23/2021 M.D.   On: 09/26/2020 02:23   DG Foot Complete Right  Result Date: 09/05/2020 CLINICAL DATA:  Recent fall with foot pain, initial encounter EXAM: RIGHT FOOT COMPLETE - 3+ VIEW COMPARISON:  None. FINDINGS: There is no evidence of fracture or dislocation. There is no evidence of arthropathy or other focal bone abnormality. Soft tissues are unremarkable. IMPRESSION: No acute abnormality noted. Electronically Signed   By: 09/07/2020 M.D.   On: 09/05/2020 11:48   09/07/2020 OB LESS THAN 14 WEEKS WITH OB TRANSVAGINAL  Result Date: 09/05/2020 CLINICAL DATA:  Pregnancy. History of tubal ligation in June of 2021 EXAM: OBSTETRIC <14 WK 02-11-2005 AND TRANSVAGINAL OB US TECHNIQUE: Both transabdominal and transvaginal ultrasound examinations were performed for complete evaluation of the gestation as well as the maternal uterus, adnexal regions, and pelvic cul-de-sac. Transvaginal technique was performed to assess early pregnancy. COMPARISON:  None. FINDINGS: Intrauterine gestational sac: Single Yolk sac:  Visualized. Embryo:  Visualized. Cardiac Activity: Visualized. Heart Rate: 127 bpm CRL:  7.1 mm   6 w   4 d                  [place] EDC: 04/27/2021 Subchorionic hemorrhage:  Small-moderate. Maternal uterus/adnexae: Unremarkable right ovary. Probable left ovarian corpus  luteal cyst. Trace fluid within the cul-de-sac. IMPRESSION: 1. Single live intrauterine gestation measuring 6 weeks 4 days by crown-rump length. 2. Active embryonic heart tones at 127 bpm. 3. Small-moderate-sized subchorionic hemorrhage. Electronically Signed   By: Duanne Guess D.O.   On: 09/05/2020 12:26    MAU Management/MDM: Orders Placed This Encounter  Procedures  . US OB Comp Less 14 Wks  . Discharge patient    No orders of the defined types were placed in this encounter.   [place] confirms viable IUP with small to moderate [name], likely causing the bleeding.  Bleeding precautions/reasons to seek care reviewed with pt.  Reassurance provided that most [facility] resolve but large SCH increase risk of miscarriage. Pt states understanding. F/U with New OB visit on 10/08/20 at [facility] as scheduled.  Return to MAU as needed for emergencies.    ASSESSMENT 1. Subchorionic hemorrhage of placenta in first trimester, single or unspecified fetus   2. Vaginal bleeding in pregnancy, first trimester   3. [redacted] weeks gestation of pregnancy     PLAN Discharge home Allergies as of 09/26/2020   No  Known Allergies     Medication List    TAKE these medications   acetaminophen 325 MG tablet Commonly known as: Tylenol Take 2 tablets (650 mg total) by mouth every 6 (six) hours as needed (for pain scale < 4).   Doxylamine-Pyridoxine 10-10 MG Tbec Take 2 tablets by mouth at bedtime.   prenatal vitamin w/FE, FA 27-1 MG Tabs tablet Take 1 tablet by mouth daily at 12 noon.       Follow-up Information    Center for [facility] Follow up.   Specialty: Obstetrics and Gynecology Why: As scheduled on 10/08/20. Return to MAU as needed for heavy bleeding or severe abdominal pain or other emergencies. Contact information: [address] [place] [address] [phone]              Sharen Counter Certified Nurse-Midwife 09/26/2020  3:26 AM

## 2020-09-26 NOTE — ED Triage Notes (Addendum)
Emergency Medicine Provider OB Triage Evaluation Note  Shelby Serrano is a 25 y.o. female, [id number], at [redacted]w[redacted]d gestation who presents to the emergency department with complaints of abdominal pain.  Recently found out she was pregnant, surprise pregnancy as she had BTL after last child in June 2021.  Confirmed IUP 09/05/20.  Today started having abdominal cramping on left and down into pelvis along with vaginal bleeding.  Did pass some clots in [facility] bathroom per patient.  Denies dizziness/weakness.  No fevers.  Review of  Systems  Positive: abdominal pain, bleeding Negative: discharge, fever, chills  Physical Exam  BP 114/62 (BP Location: Right Arm)   Pulse 84   Temp (!) 97.5 F (36.4 C) (Oral)   Resp 16   SpO2 100%  General: Awake, no distress  HEENT: Atraumatic  Resp: Normal effort  Cardiac: Normal rate Abd: Nondistended, nontender  MSK: Moves all extremities without difficulty Neuro: Speech clear  Medical Decision Making  Pt evaluated for pregnancy concern and is stable for transfer to MAU. Pt is in agreement with plan for transfer.  12:52 AM Discussed with MAU APP, Misty Stanley, who accepts patient in transfer.  Clinical Impression   Vaginal bleeding in pregnancy, abdominal pain    Garlon Hatchet, PA-C 09/26/20 0101    Garlon Hatchet, PA-C 09/26/20 0101

## 2020-09-28 ENCOUNTER — Encounter: Payer: Self-pay | Admitting: Obstetrics and Gynecology

## 2020-09-28 DIAGNOSIS — Z9889 Other specified postprocedural states: Secondary | ICD-10-CM | POA: Insufficient documentation

## 2020-09-28 HISTORY — DX: Other specified postprocedural states: Z98.890

## 2020-10-08 ENCOUNTER — Encounter: Payer: Self-pay | Admitting: Obstetrics and Gynecology

## 2020-10-08 ENCOUNTER — Ambulatory Visit (INDEPENDENT_AMBULATORY_CARE_PROVIDER_SITE_OTHER): Payer: Medicaid Other | Admitting: Obstetrics and Gynecology

## 2020-10-08 ENCOUNTER — Other Ambulatory Visit: Payer: Self-pay

## 2020-10-08 ENCOUNTER — Other Ambulatory Visit (HOSPITAL_COMMUNITY)
Admission: RE | Admit: 2020-10-08 | Discharge: 2020-10-08 | Disposition: A | Discharge status: Home / Self Care | Payer: Medicaid Other | Source: Ambulatory Visit | Attending: Obstetrics and Gynecology | Admitting: Obstetrics and Gynecology

## 2020-10-08 VITALS — BP 105/77 | HR 63 | Wt 95.8 lb

## 2020-10-08 DIAGNOSIS — Z3480 Encounter for supervision of other normal pregnancy, unspecified trimester: Secondary | ICD-10-CM | POA: Insufficient documentation

## 2020-10-08 DIAGNOSIS — O209 Hemorrhage in early pregnancy, unspecified: Secondary | ICD-10-CM | POA: Insufficient documentation

## 2020-10-08 DIAGNOSIS — O219 Vomiting of pregnancy, unspecified: Secondary | ICD-10-CM | POA: Insufficient documentation

## 2020-10-08 DIAGNOSIS — D573 Sickle-cell trait: Secondary | ICD-10-CM | POA: Diagnosis not present

## 2020-10-08 DIAGNOSIS — Z641 Problems related to multiparity: Secondary | ICD-10-CM

## 2020-10-08 DIAGNOSIS — Z3A11 11 weeks gestation of pregnancy: Secondary | ICD-10-CM

## 2020-10-08 DIAGNOSIS — Z9889 Other specified postprocedural states: Secondary | ICD-10-CM | POA: Diagnosis not present

## 2020-10-08 HISTORY — DX: Problems related to multiparity: Z64.1

## 2020-10-08 HISTORY — DX: Hemorrhage in early pregnancy, unspecified: O20.9

## 2020-10-08 MED ORDER — ONDANSETRON 4 MG PO TBDP: 4.0000 mg | ORAL_TABLET | Freq: Four times a day (QID) | ORAL | 0 refills | Status: DC | PRN

## 2020-10-08 MED ORDER — DOCUSATE SODIUM 100 MG PO CAPS: 100.0000 mg | ORAL_CAPSULE | Freq: Two times a day (BID) | ORAL | 2 refills | Status: DC | PRN

## 2020-10-08 NOTE — Progress Notes (Signed)
New OB Note  10/08/2020   Clinic: [facility] for [facility]  Chief Complaint: new OB   History of Present Illness: Shelby Serrano is a 25 y.o. [id number] @ 11/2 weeks (EDC 8/15, based on 6wk u/s. LMP unknown.  Preg complicated by has Supervision of other normal pregnancy, antepartum; Sickle cell trait (HCC); Sciatic nerve pain; Failed BTL ([place]); Grand multiparity; and Vaginal bleeding affecting early pregnancy on their problem list.  Any events prior to today's visit: OB triage visits for VB, subchorionic bleeding Her periods were: irregular. LMP unknown She was using [place] BL when she conceived.  She has mild to moderate signs or symptoms of nausea/vomiting of pregnancy. Diclegis makes her sleepy She has Negative signs or symptoms of miscarriage or preterm labor   ROS: A 12-point review of systems was performed and negative, except as stated in the above HPI.  OBGYN History: As per HPI. OB History  Gravida Para Term Preterm AB Living  8 4 4  0 3 4  SAB IAB Ectopic Multiple Live Births  3 0 0 0 4    # Outcome Date GA Lbr Len/2nd Weight Sex Delivery Anes PTL Lv  8 Current           7 Term 02/18/20 [redacted]w[redacted]d 05:48 / 00:01 6 lb 5.6 oz (2.88 kg) F Vag-Spont None  LIV  6 Term 08/17/18 [redacted]w[redacted]d 08:00 / 00:08 6 lb 0.8 oz (2.745 kg) M Vag-Spont None  LIV  5 Term 07/15/16 [redacted]w[redacted]d 09:37 / 00:07 6 lb 0.1 oz (2.725 kg) F Vag-Spont EPI  LIV  4 SAB 07/2015 [redacted]w[redacted]d         3 SAB 04/2015          2 Term 04/05/12 [redacted]w[redacted]d  6 lb 2 oz (2.778 kg)  Vag-Spont   LIV     Birth Comments: no complications  1 SAB             Any issues with any prior pregnancies: no Prior children are healthy, doing well, and without any problems or issues: yes History of pap smears: Yes. Last pap smear 2019 and results were negative   Past Medical History: Past Medical History:  Diagnosis Date  . Sickle cell trait [facility])     Past Surgical History: Past Surgical History:  Procedure Laterality Date  .  TUBAL LIGATION Bilateral 02/18/2020   Procedure: POST PARTUM TUBAL LIGATION;  Surgeon: 04/19/2020, MD;  Location: [facility] LD ORS;  Service: Gynecology;  Laterality: Bilateral;    Family History:  Family History  Problem Relation Age of Onset  . Diabetes Mother   . Hypertension Mother     Social History:  Social History   Socioeconomic History  . Marital status: Single    Spouse name: Not on file  . Number of children: Not on file  . Years of education: Not on file  . Highest education level: Not on file  Occupational History  . Not on file  Tobacco Use  . Smoking status: Current Every Day Smoker    Packs/day: 1.00    Types: Cigarettes    Last attempt to quit: 02/08/2016    Years since quitting: 4.6  . Smokeless tobacco: Never Used  Vaping Use  . Vaping Use: Never used  Substance and Sexual Activity  . Alcohol use: No  . Drug use: Not Currently    Types: Marijuana    Comment: Patient denies, Positive UDS 05/04/16 [facility] ED  . Sexual activity: Yes  Other Topics  Concern  . Not on file  Social History Narrative  . Not on file   Social Determinants of Health   Financial Resource Strain: Not on file  Food Insecurity: No Food Insecurity  . Worried About Programme researcher, broadcasting/film/video in the Last Year: Never true  . Ran Out of Food in the Last Year: Never true  Transportation Needs: No Transportation Needs  . Lack of Transportation (Medical): No  . Lack of Transportation (Non-Medical): No  Physical Activity: Not on file  Stress: Not on file  Social Connections: Not on file  Intimate Partner Violence: Not on file    Allergy: No Known Allergies  Health Maintenance:  Mammogram Up to Date: not applicable  Current Outpatient Medications: Prenatal Diclegis.   Physical Exam:   BP 105/77   Pulse 63   Wt 95 lb 12.8 oz (43.5 kg)   BMI 18.10 kg/m  Body mass index is 18.1 kg/m. Contractions: Not present Vag. Bleeding: None. Fundal height: not applicable FHTs: 170s  General  appearance: Well nourished, well developed female in no acute distress.  Neck:  Supple, normal appearance, and no thyromegaly  Cardiovascular: S1, S2 normal, no murmur, rub or gallop, regular rate and rhythm Respiratory:  Clear to auscultation bilateral. Normal respiratory effort Abdomen: positive bowel sounds and no masses, hernias; diffusely non tender to palpation, non distended Breasts: pt denies any breast s/s Neuro/Psych:  Normal mood and affect.  Skin:  Warm and dry.  Lymphatic:  No inguinal lymphadenopathy.   Pelvic exam: is not limited by body habitus EGBUS: within normal limits, Vagina: within normal limits and with no blood in the vault, Cervix: normal appearing cervix without discharge or lesions, closed/long/high, Uterus:  nonenlarged, and Adnexa:  normal adnexa and no mass, fullness, tenderness  Laboratory: reviewed  Imaging:  Bedside u/s: SLIUP, +FM, subj normal AF. FHR 170s  Assessment: pt doing well  Plan: 1. Supervision of other normal pregnancy, antepartum Routine care. Genetics today and routine labs. Offer afp nv  COVID-19 Vaccine Counseling: The patient was counseled on the potential benefits and lack of known risks of COVID vaccination, during pregnancy and breastfeeding, during today's visit. The patient's questions and concerns were addressed today, including safety of the vaccination and potential side effects as they have been published by ACOG and SMFM. The patient has been informed that there have not been any documented vaccine related injuries, deaths or birth defects to infant or mom after receiving the COVID-19 vaccine to date. The patient has been made aware that although she is not at increased risk of contracting COVID-19 during pregnancy, she is at increased risk of developing severe disease and complications if she contracts COVID-19 while pregnant. All patient questions were addressed during our visit today. The patient is not planning to get  vaccinated at this time.   - CBC/D/Plt+RPR+Rh+ABO+Rub Ab... - Cytology - PAP( [place]) - [place] MFM OB COMP + 14 WK; Future - Culture, OB Urine  2. Failed BTL ([place]) D/w her re: salpingectomy  3. Sickle cell trait (HCC) qtrimester ucx  4. N/V of pregnancy Weight down 14lbs. zofran odt scheduled recommend and RTC 1wk for RN visit to see how she's doing   Problem list reviewed and updated.  Follow up in 1 weeks.  >50% of 35 min visit spent on counseling and coordination of care.     Cornelia Copa MD Attending Center for [facility])

## 2020-10-09 ENCOUNTER — Encounter: Payer: Self-pay | Admitting: Obstetrics and Gynecology

## 2020-10-09 DIAGNOSIS — R7989 Other specified abnormal findings of blood chemistry: Secondary | ICD-10-CM | POA: Insufficient documentation

## 2020-10-09 HISTORY — DX: Other specified abnormal findings of blood chemistry: R79.89

## 2020-10-09 LAB — CBC/D/PLT+RPR+RH+ABO+RUB AB...
Antibody Screen: NEGATIVE
Basophils Absolute: 0 10*3/uL (ref 0.0–0.2)
Basos: 0 %
EOS (ABSOLUTE): 0 10*3/uL (ref 0.0–0.4)
Eos: 0 %
HCV Ab: <0.1 s/co ratio (ref 0.0–0.9)
HIV Screen 4th Generation wRfx: NONREACTIVE
Hematocrit: 41.7 % (ref 34.0–46.6)
Hemoglobin: 14.3 g/dL (ref 11.1–15.9)
Hepatitis B Surface Ag: NEGATIVE
Immature Grans (Abs): 0 10*3/uL (ref 0.0–0.1)
Immature Granulocytes: 0 %
Lymphocytes Absolute: 1.4 10*3/uL (ref 0.7–3.1)
Lymphs: 19 %
MCH: 29.5 pg (ref 26.6–33.0)
MCHC: 34.3 g/dL (ref 31.5–35.7)
MCV: 86 fL (ref 79–97)
Monocytes Absolute: 0.4 10*3/uL (ref 0.1–0.9)
Monocytes: 6 %
Neutrophils Absolute: 5.3 10*3/uL (ref 1.4–7.0)
Neutrophils: 75 %
Platelets: 143 10*3/uL — ABNORMAL LOW (ref 150–450)
RBC: 4.84 x10E6/uL (ref 3.77–5.28)
RDW: 12.8 % (ref 11.7–15.4)
RPR Ser Ql: NONREACTIVE
Rh Factor: POSITIVE
Rubella Antibodies, IGG: 3.25 index (ref >0.99)
WBC: 7.2 10*3/uL (ref 3.4–10.8)

## 2020-10-09 LAB — CYTOLOGY - PAP
Chlamydia: NEGATIVE
Comment: NEGATIVE
Comment: NEGATIVE
Comment: NORMAL
Diagnosis: NEGATIVE
Neisseria Gonorrhea: NEGATIVE
Trichomonas: NEGATIVE

## 2020-10-09 LAB — VITAMIN D 25 HYDROXY (VIT D DEFICIENCY, FRACTURES): Vit D, 25-Hydroxy: 9.2 ng/mL — ABNORMAL LOW (ref 30.0–100.0)

## 2020-10-09 LAB — HCV INTERPRETATION

## 2020-10-09 MED ORDER — VITAMIN D (ERGOCALCIFEROL) 1.25 MG (50000 UNIT) PO CAPS: 50000.0000 [IU] | ORAL_CAPSULE | ORAL | 0 refills | Status: AC

## 2020-10-09 NOTE — Addendum Note (Signed)
Addended by: Wildwood Bing on: 10/09/2020 09:51 PM   Modules accepted: Orders

## 2020-10-10 LAB — CULTURE, OB URINE

## 2020-10-10 LAB — URINE CULTURE, OB REFLEX

## 2020-10-13 ENCOUNTER — Encounter: Payer: Self-pay | Admitting: *Deleted

## 2020-10-15 ENCOUNTER — Telehealth (INDEPENDENT_AMBULATORY_CARE_PROVIDER_SITE_OTHER): Payer: Medicaid Other

## 2020-10-15 DIAGNOSIS — O219 Vomiting of pregnancy, unspecified: Secondary | ICD-10-CM

## 2020-10-15 DIAGNOSIS — Z3A00 Weeks of gestation of pregnancy not specified: Secondary | ICD-10-CM

## 2020-10-15 MED ORDER — PROMETHAZINE HCL 25 MG PO TABS: 25.0000 mg | ORAL_TABLET | Freq: Four times a day (QID) | ORAL | 1 refills | Status: DC | PRN

## 2020-10-15 NOTE — Progress Notes (Addendum)
10:24a- Called Pt to do a Virtual My Chart Nurse visit, no answer & VM not set up. Will retry in 10 minutes.   I connected with  Shelby Serrano on 10/15/20 at 10:00 AM EST Virtual My Chart visit from my office [facility], she was at home and verified that I am speaking with the correct person using two identifiers.   I discussed the limitations, risks, security and privacy concerns of performing an evaluation and management service by telephone and the availability of in person appointments. I also discussed with the patient that there may be a patient responsible charge related to this service. The patient expressed understanding and agreed to proceed.  Henrietta Dine, CMA 10/15/2020  11:07 AM

## 2020-10-15 NOTE — Progress Notes (Signed)
Pt had virtual visit for F/U Nausea, Pt currently taking Zofran 4mg , states is not working. Spoke with Nurse advised to start Pt on Phenergan,Sent Rx to pharmacy on file.

## 2020-10-21 NOTE — Progress Notes (Signed)
Patient was assessed and managed by nursing staff during this encounter. I have reviewed the chart and agree with the documentation and plan. I have also made any necessary editorial changes.  Saketh Daubert, MD 10/21/2020 10:05 AM   

## 2020-10-29 ENCOUNTER — Other Ambulatory Visit: Payer: Self-pay

## 2020-10-29 ENCOUNTER — Inpatient Hospital Stay (HOSPITAL_COMMUNITY)
Admission: AD | Admit: 2020-10-29 | Discharge: 2020-10-29 | Disposition: A | Discharge status: Home / Self Care | Payer: Medicaid Other | Attending: Obstetrics & Gynecology | Admitting: Obstetrics & Gynecology

## 2020-10-29 ENCOUNTER — Inpatient Hospital Stay (HOSPITAL_BASED_OUTPATIENT_CLINIC_OR_DEPARTMENT_OTHER): Payer: Medicaid Other

## 2020-10-29 ENCOUNTER — Encounter (HOSPITAL_COMMUNITY): Payer: Self-pay | Admitting: Obstetrics & Gynecology

## 2020-10-29 DIAGNOSIS — O208 Other hemorrhage in early pregnancy: Secondary | ICD-10-CM | POA: Diagnosis not present

## 2020-10-29 DIAGNOSIS — O26892 Other specified pregnancy related conditions, second trimester: Secondary | ICD-10-CM | POA: Insufficient documentation

## 2020-10-29 DIAGNOSIS — R102 Pelvic and perineal pain: Secondary | ICD-10-CM | POA: Insufficient documentation

## 2020-10-29 DIAGNOSIS — O4692 Antepartum hemorrhage, unspecified, second trimester: Secondary | ICD-10-CM

## 2020-10-29 DIAGNOSIS — Z3A14 14 weeks gestation of pregnancy: Secondary | ICD-10-CM

## 2020-10-29 DIAGNOSIS — Z6790 Unspecified blood type, Rh positive: Secondary | ICD-10-CM

## 2020-10-29 DIAGNOSIS — R109 Unspecified abdominal pain: Secondary | ICD-10-CM

## 2020-10-29 DIAGNOSIS — O468X2 Other antepartum hemorrhage, second trimester: Secondary | ICD-10-CM

## 2020-10-29 DIAGNOSIS — O418X20 Other specified disorders of amniotic fluid and membranes, second trimester, not applicable or unspecified: Secondary | ICD-10-CM

## 2020-10-29 DIAGNOSIS — O4592 Premature separation of placenta, unspecified, second trimester: Secondary | ICD-10-CM | POA: Diagnosis not present

## 2020-10-29 LAB — CBC
HCT: 35.4 % — ABNORMAL LOW (ref 36.0–46.0)
Hemoglobin: 12.5 g/dL (ref 12.0–15.0)
MCH: 30.8 pg (ref 26.0–34.0)
MCHC: 35.3 g/dL (ref 30.0–36.0)
MCV: 87.2 fL (ref 80.0–100.0)
Platelets: 161 10*3/uL (ref 150–400)
RBC: 4.06 MIL/uL (ref 3.87–5.11)
RDW: 14.4 % (ref 11.5–15.5)
WBC: 7.9 10*3/uL (ref 4.0–10.5)
nRBC: 0 % (ref 0.0–0.2)

## 2020-10-29 MED ORDER — ACETAMINOPHEN 500 MG PO TABS
1000.0000 mg | ORAL_TABLET | Freq: Once | ORAL | Status: AC
  Administered 2020-10-29: 1000 mg via ORAL
  Filled 2020-10-29: qty 2

## 2020-10-29 MED ORDER — IBUPROFEN 800 MG PO TABS
400.0000 mg | ORAL_TABLET | Freq: Once | ORAL | Status: AC
  Administered 2020-10-29: 400 mg via ORAL
  Filled 2020-10-29: qty 1

## 2020-10-29 NOTE — MAU Provider Note (Cosign Needed Addendum)
History     CSN: [id number]  Arrival date and time: 10/29/20 0248   Event Date/Time   First Provider Initiated Contact with Patient 10/29/20 [phone]      Chief Complaint  Patient presents with  . Abdominal Pain  . Vaginal Bleeding   25 y.o. [id number] @14 .2 wks with known [facility] presenting with VB. Report onset around midnight. States she woke up in a puddle and soaked her sheets. Reports saturating 10 pads since. Reports a hard lump in her lower abdomen since earlier today. Endorses lower abdominal pain. Describes as constant cramping. Rates pain 7/10. Has not treated.   OB History    Gravida  8   Para  4   Term  4   Preterm  0   AB  3   Living  4     SAB  3   IAB  0   Ectopic  0   Multiple  0   Live Births  4           Past Medical History:  Diagnosis Date  . Sickle cell trait [facility])     Past Surgical History:  Procedure Laterality Date  . TUBAL LIGATION Bilateral 02/18/2020   Procedure: POST PARTUM TUBAL LIGATION;  Surgeon: 04/19/2020, MD;  Location: [facility] LD ORS;  Service: Gynecology;  Laterality: Bilateral;    Family History  Problem Relation Age of Onset  . Diabetes Mother   . Hypertension Mother     Social History   Tobacco Use  . Smoking status: Former Smoker    Packs/day: 1.00    Types: Cigarettes    Quit date: 02/08/2016    Years since quitting: 4.7  . Smokeless tobacco: Never Used  Vaping Use  . Vaping Use: Never used  Substance Use Topics  . Alcohol use: No  . Drug use: Not Currently    Types: Marijuana    Comment: Patient denies, Positive UDS 05/04/16 [facility] ED    Allergies: No Known Allergies  Medications Prior to Admission  Medication Sig Dispense Refill Last Dose  . ondansetron (ZOFRAN ODT) 4 MG disintegrating tablet Take 1 tablet (4 mg total) by mouth every 6 (six) hours as needed for nausea. 45 tablet 0 Past Week at Unknown time  . prenatal vitamin w/FE, FA (PRENATAL 1 + 1) 27-1 MG TABS tablet Take 1 tablet by mouth daily at 12  noon. 90 tablet 2 10/28/2020 at Unknown time  . acetaminophen (TYLENOL) 325 MG tablet Take 2 tablets (650 mg total) by mouth every 6 (six) hours as needed (for pain scale < 4). (Patient not taking: Reported on 10/08/2020) 30 tablet 0   . docusate sodium (COLACE) 100 MG capsule Take 1 capsule (100 mg total) by mouth 2 (two) times daily as needed. (Patient not taking: Reported on 10/15/2020) 30 capsule 2   . Doxylamine-Pyridoxine 10-10 MG TBEC Take 2 tablets by mouth at bedtime. (Patient not taking: Reported on 10/08/2020) 60 tablet 2   . promethazine (PHENERGAN) 25 MG tablet Take 1 tablet (25 mg total) by mouth every 6 (six) hours as needed for nausea or vomiting. 30 tablet 1   . Vitamin D, Ergocalciferol, (DRISDOL) 1.25 MG (50000 UNIT) CAPS capsule Take 1 capsule (50,000 Units total) by mouth every 7 (seven) days for 12 doses. (Patient not taking: Reported on 10/15/2020) 12 capsule 0     Review of Systems  Gastrointestinal: Positive for abdominal pain.  Genitourinary: Positive for vaginal bleeding. Negative for difficulty urinating.   Physical  Exam   Blood pressure (!) 100/50, pulse (!) 59, temperature 98.4 F (36.9 C), temperature source Oral, resp. rate 20, height 5\' 1"  (1.549 m), weight 46.7 kg, SpO2 100 %, not currently breastfeeding.  Physical Exam Vitals and nursing note reviewed. Exam conducted with a chaperone present.  Constitutional:      General: She is not in acute distress.    Appearance: Normal appearance.  HENT:     Head: Normocephalic and atraumatic.  Cardiovascular:     Rate and Rhythm: Normal rate.  Pulmonary:     Effort: Pulmonary effort is normal. No respiratory distress.  Abdominal:     General: There is no distension.     Palpations: Abdomen is soft. There is no mass.     Tenderness: There is no abdominal tenderness. There is no guarding or rebound.     Hernia: No hernia is present.     Comments: Fundus palpated 3-4cm above pubis symphysis  Genitourinary:     Comments: External: no lesions or erythema Vagina: rugated, pink, moist, small amt bloody discharge, cleared with 2 fox swabs, no active bleeding Uterus: + enlarged, anteverted, non tender, no CMT Adnexae: no masses, no tenderness left, no tenderness right Cervix closed  Musculoskeletal:        General: Normal range of motion.     Cervical back: Normal range of motion.  Skin:    General: Skin is warm and dry.  Neurological:     General: No focal deficit present.     Mental Status: She is alert and oriented to person, place, and time.  Psychiatric:        Mood and Affect: Mood normal.        Behavior: Behavior normal.    Results for orders placed or performed during the hospital encounter of 10/29/20 (from the past 24 hour(s))  CBC     Status: Abnormal   Collection Time: 10/29/20  3:39 AM  Result Value Ref Range   WBC 7.9 4.0 - 10.5 K/uL   RBC 4.06 3.87 - 5.11 MIL/uL   Hemoglobin 12.5 12.0 - 15.0 g/dL   HCT 10/31/20 (L) 54.6 - 50.3 %   MCV 87.2 80.0 - 100.0 fL   MCH 30.8 26.0 - 34.0 pg   MCHC 35.3 30.0 - 36.0 g/dL   RDW 54.6 56.8 - 12.7 %   Platelets 161 150 - 400 K/uL   nRBC 0.0 0.0 - 0.2 %   51.7 prelim: large SCH measuring 5.9x5.0x8.3, FHR 158, nml AFV MAU Course  Procedures Tylenol Ibuprofen  MDM Labs and [place] ordered and reviewed. H/H stable, not orthostatic. QBL 23mL. After several hrs of observation scant blood on peripad and pain improved. Discussed findings with pt including large SCH, no curative treatment, likelihood to bleed again, and increased risk for SAB. Recommend pelvic rest and OOW today and tomorrow. Strict bleeding return precautions discussed. Stable for discharge home.    Assessment and Plan   1. [redacted] weeks gestation of pregnancy   2. Vaginal bleeding in pregnancy, second trimester   3. Subchorionic hematoma in second trimester, single or unspecified fetus   4. Blood type, Rh positive    Discharge home Follow up at [facility] as scheduled SAB/bleeding  precautions Pelvic rest  Allergies as of 10/29/2020   No Known Allergies     Medication List    TAKE these medications   acetaminophen 325 MG tablet Commonly known as: Tylenol Take 2 tablets (650 mg total) by mouth every 6 (six) hours  as needed (for pain scale < 4).   docusate sodium 100 MG capsule Commonly known as: COLACE Take 1 capsule (100 mg total) by mouth 2 (two) times daily as needed.   Doxylamine-Pyridoxine 10-10 MG Tbec Take 2 tablets by mouth at bedtime.   ondansetron 4 MG disintegrating tablet Commonly known as: Zofran ODT Take 1 tablet (4 mg total) by mouth every 6 (six) hours as needed for nausea.   prenatal vitamin w/FE, FA 27-1 MG Tabs tablet Take 1 tablet by mouth daily at 12 noon.   promethazine 25 MG tablet Commonly known as: PHENERGAN Take 1 tablet (25 mg total) by mouth every 6 (six) hours as needed for nausea or vomiting.   Vitamin D (Ergocalciferol) 1.25 MG (50000 UNIT) Caps capsule Commonly known as: DRISDOL Take 1 capsule (50,000 Units total) by mouth every 7 (seven) days for 12 doses.      Donette Larry, CNM 10/29/2020, 7:16 AM

## 2020-10-29 NOTE — MAU Note (Addendum)
...  [name] is a 25 y.o. at [redacted]w[redacted]d here in MAU reporting: lower abdominal pain, abdominal tightness, and lower back pain that she describes as "it has been as hard as a rock" that began yesterday afternoon, 10/28/2020. She reports around midnight she woke up in a "puddle" of blood and states she has soaked through a total of 10 pads since then. Upon physical assessment, patient's uterus appears to be distended and is hard to palpation. The patient reports it has "looked like that since yesterday" but reports it has "gotten higher since then."  Hx of subchorionic hemorrhage in this pregnancy.  Pain score: 7/10 lower abdominal pain  FHT: 155 doppler

## 2020-10-29 NOTE — Discharge Instructions (Signed)
Subchorionic Hematoma  A hematoma is a collection of blood outside of the blood vessels. A subchorionic hematoma is a collection of blood between the outer wall of the embryo (chorion) and the inner wall of the uterus. This condition can cause vaginal bleeding. Early small hematomas usually shrink on their own and do not affect your baby or pregnancy. When bleeding starts later in pregnancy, or if the hematoma is larger or occurs in older pregnant women, the condition may be more serious. Larger hematomas increase the chances of miscarriage. This condition also increases the risk of:  Premature separation of the placenta from the uterus.  Premature (preterm) labor.  Stillbirth. What are the causes? The exact cause of this condition is not known. It occurs when blood is trapped between the placenta and the uterine wall because the placenta has separated from the original site of implantation. What increases the risk? You are more likely to develop this condition if:  You were treated with fertility medicines.  You became pregnant through in vitro fertilization (IVF). What are the signs or symptoms? Symptoms of this condition include:  Vaginal spotting or bleeding.  Abdominal pain. This is rare. Sometimes you may have no symptoms and the bleeding may only be seen when ultrasound images are taken (transvaginal ultrasound). How is this diagnosed? This condition is diagnosed based on a physical exam. This includes a pelvic exam. You may also have other tests, including:  Blood tests.  Urine tests.  Ultrasound of the abdomen. How is this treated? Treatment for this condition can vary. Treatment may include:  Watchful waiting. You will be monitored closely for any changes in bleeding.  Medicines.  Activity restriction. This may be needed until the bleeding stops.  A medicine called Rh immunoglobulin. This is given if you have an Rh-negative blood type. It prevents Rh  sensitization. Follow these instructions at home:  Stay on bed rest if told to do so by your health care provider.  Do not lift anything that is heavier than 10 lb (4.5 kg), or the limit that you are told by your health care provider.  Track and write down the number of pads you use each day and how soaked (saturated) they are.  Do not use tampons.  Keep all follow-up visits. This is important. Your health care provider may ask you to have follow-up blood tests or ultrasound tests or both. Contact a health care provider if:  You have any vaginal bleeding.  You have a fever. Get help right away if:  You have severe cramps in your stomach, back, abdomen, or pelvis.  You pass large clots or tissue. Save any tissue for your health care provider to look at.  You faint.  You become light-headed or weak. Summary  A subchorionic hematoma is a collection of blood between the outer wall of the embryo (chorion) and the inner wall of the uterus.  This condition can cause vaginal bleeding.  Sometimes you may have no symptoms and the bleeding may only be seen when ultrasound images are taken.  Treatment may include watchful waiting, medicines, or activity restriction.  Keep all follow-up visits. Get help right away if you have severe cramps or heavy vaginal bleeding. This information is not intended to replace advice given to you by your health care provider. Make sure you discuss any questions you have with your health care provider. Document Revised: 05/26/2020 Document Reviewed: 05/26/2020 Elsevier Patient Education  2021 [employer]vier Inc.   

## 2020-11-05 ENCOUNTER — Telehealth: Payer: Medicaid Other | Admitting: Obstetrics and Gynecology

## 2020-11-05 NOTE — Progress Notes (Deleted)
9:50a-Called Pt for My Chart visit, no answer, VM not set up. Will retry in 10 to 15 minutes.

## 2020-11-07 ENCOUNTER — Encounter: Payer: Self-pay | Admitting: *Deleted

## 2020-11-17 ENCOUNTER — Encounter: Payer: Self-pay | Admitting: Obstetrics and Gynecology

## 2020-11-17 ENCOUNTER — Encounter: Payer: Medicaid Other | Admitting: Obstetrics and Gynecology

## 2020-11-18 NOTE — Progress Notes (Signed)
Patient did not keep her return OB appointment for 11/17/2020.  Cornelia Copa MD Attending Center for [employer] [facility])

## 2020-11-24 ENCOUNTER — Encounter: Payer: Self-pay | Admitting: Family Medicine

## 2020-11-27 ENCOUNTER — Encounter: Payer: Medicaid Other | Admitting: Obstetrics and Gynecology

## 2020-12-01 ENCOUNTER — Ambulatory Visit: Payer: Medicaid Other | Attending: Obstetrics and Gynecology

## 2020-12-01 ENCOUNTER — Encounter: Payer: Self-pay | Admitting: *Deleted

## 2021-01-07 ENCOUNTER — Telehealth: Payer: Self-pay | Admitting: Family Medicine

## 2021-01-07 NOTE — Telephone Encounter (Signed)
Pt called states that she needs to see someone due to pain and preg and bleeding, I advised I could get a nurse to speak with pt but pt declined nurse and was very  irate, rude, and stated that she can not come to any appts mon-fri that she only can make weekend appt. I advised pt of office hours and days and again asked pt if I could get nurse to speak with her. Pt again declined with verbal abuse. I apologized to the pt for any inconvenience but wanted to get a nurse to talk to her due to symptoms, again she declined with harsh words and disconnected call

## 2021-01-10 ENCOUNTER — Inpatient Hospital Stay (HOSPITAL_COMMUNITY)
Admission: AD | Admit: 2021-01-10 | Discharge: 2021-01-12 | DRG: 807 | Disposition: A | Discharge status: Home / Self Care | Payer: Medicaid Other | Attending: Obstetrics & Gynecology | Admitting: Obstetrics & Gynecology

## 2021-01-10 DIAGNOSIS — Z87891 Personal history of nicotine dependence: Secondary | ICD-10-CM

## 2021-01-10 DIAGNOSIS — Z23 Encounter for immunization: Secondary | ICD-10-CM

## 2021-01-10 DIAGNOSIS — O4692 Antepartum hemorrhage, unspecified, second trimester: Secondary | ICD-10-CM | POA: Diagnosis not present

## 2021-01-10 DIAGNOSIS — G8918 Other acute postprocedural pain: Secondary | ICD-10-CM | POA: Diagnosis not present

## 2021-01-10 DIAGNOSIS — Z9889 Other specified postprocedural states: Secondary | ICD-10-CM

## 2021-01-10 DIAGNOSIS — O99355 Diseases of the nervous system complicating the puerperium: Secondary | ICD-10-CM | POA: Diagnosis not present

## 2021-01-10 DIAGNOSIS — O9902 Anemia complicating childbirth: Secondary | ICD-10-CM | POA: Diagnosis present

## 2021-01-10 DIAGNOSIS — Z641 Problems related to multiparity: Secondary | ICD-10-CM

## 2021-01-10 DIAGNOSIS — O321XX0 Maternal care for breech presentation, not applicable or unspecified: Secondary | ICD-10-CM | POA: Diagnosis not present

## 2021-01-10 DIAGNOSIS — D573 Sickle-cell trait: Secondary | ICD-10-CM | POA: Diagnosis present

## 2021-01-10 DIAGNOSIS — O328XX0 Maternal care for other malpresentation of fetus, not applicable or unspecified: Secondary | ICD-10-CM | POA: Diagnosis present

## 2021-01-10 DIAGNOSIS — O0990 Supervision of high risk pregnancy, unspecified, unspecified trimester: Secondary | ICD-10-CM

## 2021-01-10 DIAGNOSIS — Z3A24 24 weeks gestation of pregnancy: Secondary | ICD-10-CM

## 2021-01-10 DIAGNOSIS — Z20822 Contact with and (suspected) exposure to covid-19: Secondary | ICD-10-CM | POA: Diagnosis present

## 2021-01-10 DIAGNOSIS — R109 Unspecified abdominal pain: Secondary | ICD-10-CM | POA: Diagnosis present

## 2021-01-10 DIAGNOSIS — O209 Hemorrhage in early pregnancy, unspecified: Secondary | ICD-10-CM | POA: Diagnosis present

## 2021-01-10 HISTORY — DX: Supervision of high risk pregnancy, unspecified, unspecified trimester: O09.90

## 2021-01-10 LAB — WET PREP, GENITAL
Clue Cells Wet Prep HPF POC: NONE SEEN
Sperm: NONE SEEN
Trich, Wet Prep: NONE SEEN
Yeast Wet Prep HPF POC: NONE SEEN

## 2021-01-10 LAB — RESP PANEL BY RT-PCR (FLU A&B, COVID) ARPGX2
Influenza A by PCR: NEGATIVE
Influenza B by PCR: NEGATIVE
SARS Coronavirus 2 by RT PCR: NEGATIVE

## 2021-01-10 LAB — COMPREHENSIVE METABOLIC PANEL
ALT: 17 U/L (ref 0–44)
AST: 20 U/L (ref 15–41)
Albumin: 2.8 g/dL — ABNORMAL LOW (ref 3.5–5.0)
Alkaline Phosphatase: 113 U/L (ref 38–126)
Anion gap: 6 (ref 5–15)
BUN: <5 mg/dL — ABNORMAL LOW (ref 6–20)
CO2: 24 mmol/L (ref 22–32)
Calcium: 8.5 mg/dL — ABNORMAL LOW (ref 8.9–10.3)
Chloride: 106 mmol/L (ref 98–111)
Creatinine, Ser: 0.74 mg/dL (ref 0.44–1.00)
GFR, Estimated: >60 mL/min (ref >60)
Glucose, Bld: 86 mg/dL (ref 70–99)
Potassium: 3.4 mmol/L — ABNORMAL LOW (ref 3.5–5.1)
Sodium: 136 mmol/L (ref 135–145)
Total Bilirubin: 0.9 mg/dL (ref 0.3–1.2)
Total Protein: 6 g/dL — ABNORMAL LOW (ref 6.5–8.1)

## 2021-01-10 LAB — CBC
HCT: 32.4 % — ABNORMAL LOW (ref 36.0–46.0)
Hemoglobin: 11.1 g/dL — ABNORMAL LOW (ref 12.0–15.0)
MCH: 29.9 pg (ref 26.0–34.0)
MCHC: 34.3 g/dL (ref 30.0–36.0)
MCV: 87.3 fL (ref 80.0–100.0)
Platelets: 156 10*3/uL (ref 150–400)
RBC: 3.71 MIL/uL — ABNORMAL LOW (ref 3.87–5.11)
RDW: 13.2 % (ref 11.5–15.5)
WBC: 15.4 10*3/uL — ABNORMAL HIGH (ref 4.0–10.5)
nRBC: 0 % (ref 0.0–0.2)

## 2021-01-10 LAB — RAPID URINE DRUG SCREEN, HOSP PERFORMED
Amphetamines: NOT DETECTED
Barbiturates: NOT DETECTED
Benzodiazepines: NOT DETECTED
Cocaine: NOT DETECTED
Opiates: NOT DETECTED
Tetrahydrocannabinol: POSITIVE — AB

## 2021-01-10 LAB — TYPE AND SCREEN
ABO/RH(D): O POS
Antibody Screen: NEGATIVE

## 2021-01-10 MED ORDER — LACTATED RINGERS IV SOLN: 500.0000 mL | INTRAVENOUS | Status: DC | PRN

## 2021-01-10 MED ORDER — SENNOSIDES-DOCUSATE SODIUM 8.6-50 MG PO TABS
2.0000 | ORAL_TABLET | Freq: Every day | ORAL | Status: DC
  Administered 2021-01-11 – 2021-01-12 (×2): 2 via ORAL
  Filled 2021-01-10 (×3): qty 2

## 2021-01-10 MED ORDER — LACTATED RINGERS IV SOLN: INTRAVENOUS | Status: DC

## 2021-01-10 MED ORDER — ONDANSETRON HCL 4 MG PO TABS: 4.0000 mg | ORAL_TABLET | ORAL | Status: DC | PRN

## 2021-01-10 MED ORDER — PRENATAL MULTIVITAMIN CH
1.0000 | ORAL_TABLET | Freq: Every day | ORAL | Status: DC
  Administered 2021-01-11 – 2021-01-12 (×2): 1 via ORAL
  Filled 2021-01-10 (×2): qty 1

## 2021-01-10 MED ORDER — OXYCODONE-ACETAMINOPHEN 5-325 MG PO TABS
1.0000 | ORAL_TABLET | ORAL | Status: DC | PRN
Start: 2021-01-10 — End: 2021-01-12
  Administered 2021-01-11 – 2021-01-12 (×3): 1 via ORAL
  Filled 2021-01-10 (×3): qty 1

## 2021-01-10 MED ORDER — COCONUT OIL OIL: 1.0000 "application " | TOPICAL_OIL | Status: DC | PRN

## 2021-01-10 MED ORDER — ONDANSETRON HCL 4 MG/2ML IJ SOLN: 4.0000 mg | Freq: Four times a day (QID) | INTRAMUSCULAR | Status: DC | PRN

## 2021-01-10 MED ORDER — SODIUM CHLORIDE 0.9 % IV SOLN
1.0000 g | INTRAVENOUS | Status: DC
  Filled 2021-01-10 (×4): qty 1000

## 2021-01-10 MED ORDER — ACETAMINOPHEN 325 MG PO TABS: 650.0000 mg | ORAL_TABLET | ORAL | Status: DC | PRN

## 2021-01-10 MED ORDER — TETANUS-DIPHTH-ACELL PERTUSSIS 5-2.5-18.5 LF-MCG/0.5 IM SUSY
0.5000 mL | PREFILLED_SYRINGE | Freq: Once | INTRAMUSCULAR | Status: AC
  Administered 2021-01-12: 0.5 mL via INTRAMUSCULAR
  Filled 2021-01-10: qty 0.5

## 2021-01-10 MED ORDER — IBUPROFEN 600 MG PO TABS
600.0000 mg | ORAL_TABLET | Freq: Four times a day (QID) | ORAL | Status: DC
  Administered 2021-01-10 – 2021-01-12 (×7): 600 mg via ORAL
  Filled 2021-01-10 (×7): qty 1

## 2021-01-10 MED ORDER — SODIUM CHLORIDE 0.9 % IV SOLN: 2.0000 g | Freq: Once | INTRAVENOUS | Status: DC

## 2021-01-10 MED ORDER — LIDOCAINE HCL (PF) 1 % IJ SOLN: 30.0000 mL | INTRAMUSCULAR | Status: DC | PRN

## 2021-01-10 MED ORDER — SIMETHICONE 80 MG PO CHEW
80.0000 mg | CHEWABLE_TABLET | ORAL | Status: DC | PRN
  Filled 2021-01-10: qty 1

## 2021-01-10 MED ORDER — FENTANYL CITRATE (PF) 100 MCG/2ML IJ SOLN
INTRAMUSCULAR | Status: AC
  Administered 2021-01-10: 50 ug via INTRAVENOUS
  Filled 2021-01-10: qty 2

## 2021-01-10 MED ORDER — DIPHENHYDRAMINE HCL 25 MG PO CAPS: 25.0000 mg | ORAL_CAPSULE | Freq: Four times a day (QID) | ORAL | Status: DC | PRN

## 2021-01-10 MED ORDER — OXYCODONE-ACETAMINOPHEN 5-325 MG PO TABS
2.0000 | ORAL_TABLET | ORAL | Status: DC | PRN
  Administered 2021-01-12: 2 via ORAL
  Filled 2021-01-10: qty 2

## 2021-01-10 MED ORDER — DIBUCAINE (PERIANAL) 1 % EX OINT: 1.0000 "application " | TOPICAL_OINTMENT | CUTANEOUS | Status: DC | PRN

## 2021-01-10 MED ORDER — ONDANSETRON HCL 4 MG/2ML IJ SOLN: 4.0000 mg | INTRAMUSCULAR | Status: DC | PRN

## 2021-01-10 MED ORDER — OXYTOCIN-SODIUM CHLORIDE 30-0.9 UT/500ML-% IV SOLN: 2.5000 [IU]/h | INTRAVENOUS | Status: DC

## 2021-01-10 MED ORDER — WITCH HAZEL-GLYCERIN EX PADS: 1.0000 "application " | MEDICATED_PAD | CUTANEOUS | Status: DC | PRN

## 2021-01-10 MED ORDER — OXYTOCIN BOLUS FROM INFUSION
333.0000 mL | Freq: Once | INTRAVENOUS | Status: AC
  Administered 2021-01-10: 333 mL via INTRAVENOUS

## 2021-01-10 MED ORDER — BENZOCAINE-MENTHOL 20-0.5 % EX AERO
1.0000 | INHALATION_SPRAY | CUTANEOUS | Status: DC | PRN
Start: 2021-01-10 — End: 2021-01-12

## 2021-01-10 MED ORDER — FENTANYL CITRATE (PF) 100 MCG/2ML IJ SOLN: 50.0000 ug | Freq: Once | INTRAMUSCULAR | Status: AC

## 2021-01-10 MED ORDER — SOD CITRATE-CITRIC ACID 500-334 MG/5ML PO SOLN: 30.0000 mL | ORAL | Status: DC | PRN

## 2021-01-10 NOTE — Discharge Summary (Signed)
Postpartum Discharge Summary      Patient Name: Shelby Serrano DOB: 11-Oct-1995 MRN: [id number]  Date of admission: 01/10/2021 Delivery date:01/10/2021  Delivering provider: Randa Ngo  Date of discharge: 01/12/2021  Admitting diagnosis: Supervision of high risk pregnancy, antepartum [O09.90] Intrauterine pregnancy: [redacted]w[redacted]d     Secondary diagnosis:  Principal Problem:   Preterm delivery Active Problems:   Sickle cell trait ([place])   Failed BTL ([place])   Grand multiparity   Vaginal bleeding affecting early pregnancy   Supervision of high risk pregnancy, antepartum   Left sided abdominal pain  Additional problems: as noted above  Discharge diagnosis: Preterm Pregnancy Delivered                                              Post partum procedures: None Augmentation: N/A Complications: none  Hospital course: Onset of Labor With Vaginal Delivery      25 y.o. yo [id number] at [redacted]w[redacted]d was admitted in Active Preterm Labor on 01/10/2021, in the setting of vaginal bleeding for several weeks. On arrival to L&D pt was found to have complete cervical dilation with a bulging bag. Infant delivered from footling breech presentation, en caul. NICU team present immediately s/p delivery and promptly intubated baby prior to transfer to NICU. Patient had an uncomplicated labor course as follows:  Membrane Rupture Time/Date: 8:44 PM ,01/10/2021   Delivery Method:Vaginal, Spontaneous  Episiotomy: None  Lacerations:  None  Patient had an uncomplicated postpartum course. She is ambulating, tolerating a regular diet, passing flatus, and urinating well. Patient is discharged home in stable condition on 01/12/21.    Of note, patient has chronic left sided pain preceding pregnancy, now in pelvis, more on the level of her umbilicus that she attributes to her failed tubal and [place] clips.  Patient will be scheduled for interval salpingectomy for her failed tubal, and was told that it likely the pain is not  related to this.  Could be musculoskeletal, Flexeril given.  Will have to go to office to sign Medicaid papers this week.   Newborn Data: Birth date:01/10/2021  Birth time:8:44 PM  Gender:Female  Living status:Living  Apgars:7 ,9  Weight:770 g   Magnesium Sulfate received: No BMZ received: No Rhophylac:N/A MMR:N/A T-DaP:Given postpartum  Flu: No Transfusion:No  Physical exam  Vitals:   01/11/21 1915 01/11/21 2211 01/12/21 0550 01/12/21 0744  BP: 118/67 103/61 108/76 110/78  Pulse: 66 64 63 61  Resp: $Remo'18 16 18 18  'eakwW$ Temp: 98.3 F (36.8 C) 97.9 F (36.6 C) 98 F (36.7 C) (!) 97.4 F (36.3 C)  TempSrc: Oral Oral Oral Oral  SpO2: 100% 100% 99% 100%   General: alert, cooperative and no distress Lochia: appropriate Uterine Fundus: firm Incision: N/A DVT Evaluation: No evidence of DVT seen on physical exam. Negative Homan's sign. No cords or calf tenderness.  Labs: Lab Results  Component Value Date   WBC 15.4 (H) 01/10/2021   HGB 11.1 (L) 01/10/2021   HCT 32.4 (L) 01/10/2021   MCV 87.3 01/10/2021   PLT 156 01/10/2021   CMP Latest Ref Rng & Units 01/10/2021  Glucose 70 - 99 mg/dL 86  BUN 6 - 20 mg/dL <5(L)  Creatinine 0.44 - 1.00 mg/dL 0.74  Sodium 135 - 145 mmol/L 136  Potassium 3.5 - 5.1 mmol/L 3.4(L)  Chloride 98 - 111 mmol/L 106  CO2 22 - 32  mmol/L 24  Calcium 8.9 - 10.3 mg/dL 8.5(L)  Total Protein 6.5 - 8.1 g/dL 6.0(L)  Total Bilirubin 0.3 - 1.2 mg/dL 0.9  Alkaline Phos 38 - 126 U/L 113  AST 15 - 41 U/L 20  ALT 0 - 44 U/L 17   Edinburgh Score: Edinburgh Postnatal Depression Scale Screening Tool 01/12/2021  I have been able to laugh and see the funny side of things. (No Data)  I have looked forward with enjoyment to things. -  I have blamed myself unnecessarily when things went wrong. -  I have been anxious or worried for no good reason. -  I have felt scared or panicky for no good reason. -  Things have been getting on top of me. -  I have been so unhappy  that I have had difficulty sleeping. -  I have felt sad or miserable. -  I have been so unhappy that I have been crying. -  The thought of harming myself has occurred to me. Shelby Serrano Postnatal Depression Scale Total -     After visit meds:  Allergies as of 01/12/2021   No Known Allergies     Medication List    STOP taking these medications   Doxylamine-Pyridoxine 10-10 MG Tbec   ondansetron 4 MG disintegrating tablet Commonly known as: Zofran ODT   prenatal vitamin w/FE, FA 27-1 MG Tabs tablet   promethazine 25 MG tablet Commonly known as: PHENERGAN     TAKE these medications   acetaminophen 325 MG tablet Commonly known as: Tylenol Take 2 tablets (650 mg total) by mouth every 6 (six) hours as needed (for pain scale < 4).   cyclobenzaprine 10 MG tablet Commonly known as: FLEXERIL Take 1 tablet (10 mg total) by mouth 3 (three) times daily as needed for muscle spasms.   docusate sodium 100 MG capsule Commonly known as: COLACE Take 1 capsule (100 mg total) by mouth 2 (two) times daily as needed.   ibuprofen 600 MG tablet Commonly known as: ADVIL Take 1 tablet (600 mg total) by mouth every 6 (six) hours.   oxyCODONE 5 MG immediate release tablet Commonly known as: Oxy IR/ROXICODONE Take 1 tablet (5 mg total) by mouth every 6 (six) hours as needed for severe pain or breakthrough pain.       Discharge home in stable condition Infant Feeding: Bottle Infant Disposition:NICU Discharge instruction: per After Visit Summary and Postpartum booklet. Activity: Advance as tolerated. Pelvic rest for 6 weeks.  Diet: routine diet Future Appointments:No future appointments. Follow up Visit: Message sent to [facility] by Dr. Astrid Drafts  Please schedule this patient for a In person postpartum visit in 6 weeks with the following provider: Any provider. Additional Postpartum F/U:Postpartum Depression checkup  Low risk pregnancy complicated by: h/o failed BTL, vaginal bleeding secondary  to large subchorionic hemorrhage, preterm delivery at [redacted]w[redacted]d, baby in NICU Delivery mode:  Vaginal, Spontaneous  Anticipated Birth Control:  Plans Interval Bilateral Salpingectomy    01/12/2021 Verita Schneiders, MD

## 2021-01-10 NOTE — Progress Notes (Signed)
CNM called to assess patient screaming in the lobby. Patient states she is "20 something weeks" and having pain. Patient screaming repeatedly in pain and reports bleeding x2 weeks. Patient reports leaking of fluid but unable to answer questions. Cervical exam 10/100/+2 with bulging bag of water. Patient in good control in between contractions. Bedside ultrasound revealed FHR 145 bpm, presentation unable to be quickly determined. Dr. Alysia Penna notified of patient arrival and CNM transported patient with RN to room 217. Upon arrival to room, patient reports urge to push and BBOW noted at introitus. SROM with black fluid and Dr. Alysia Penna at bedside.   Rolm Bookbinder, CNM 01/10/21

## 2021-01-10 NOTE — Discharge Instructions (Signed)

## 2021-01-10 NOTE — H&P (Signed)
OBSTETRIC ADMISSION HISTORY AND PHYSICAL  Shelby Serrano is a 25 y.o. female [phone] with IUP at [redacted]w[redacted]d by 6 week ultrasound presenting for preterm labor in the setting of vaginal bleeding for several weeks. She reports +FMs, no VB, no blurry vision, headaches or peripheral edema, and RUQ pain.  She plans on bottle feeding. She is undecided for birth control; of note, pt has a history of failed BTL.  She received her prenatal care at [facility]   Dating: By 6 week ultrasond --->  Estimated Date of Delivery: 04/27/21  Sono:  @[redacted]w[redacted]d , CWD, normal anatomy, cephalic presentation, large subchorionic hemorrhage noted (no subsequent ultrasounds performed)  Prenatal History/Complications:  - history of term vaginal delivery x4 - history of failed BTL with Filshie clips - Large subchorionic hemorrhage with ongoing vaginal bleeding - Limited prenatal care  Past Medical History: Past Medical History:  Diagnosis Date  . Sickle cell trait [facility])     Past Surgical History: Past Surgical History:  Procedure Laterality Date  . TUBAL LIGATION Bilateral 02/18/2020   Procedure: POST PARTUM TUBAL LIGATION;  Surgeon: 04/19/2020, MD;  Location: [facility] LD ORS;  Service: Gynecology;  Laterality: Bilateral;    Obstetrical History: OB History    Gravida  8   Para  4   Term  4   Preterm  0   AB  3   Living  4     SAB  3   IAB  0   Ectopic  0   Multiple  0   Live Births  4           Social History Social History   Socioeconomic History  . Marital status: Single    Spouse name: Not on file  . Number of children: Not on file  . Years of education: Not on file  . Highest education level: Not on file  Occupational History  . Not on file  Tobacco Use  . Smoking status: Former Smoker    Packs/day: 1.00    Types: Cigarettes    Quit date: 02/08/2016    Years since quitting: 4.9  . Smokeless tobacco: Never Used  Vaping Use  . Vaping Use: Never used  Substance and Sexual Activity   . Alcohol use: No  . Drug use: Not Currently    Types: Marijuana    Comment: Patient denies, Positive UDS 05/04/16 [facility] ED  . Sexual activity: Yes  Other Topics Concern  . Not on file  Social History Narrative  . Not on file   Social Determinants of Health   Financial Resource Strain: Not on file  Food Insecurity: No Food Insecurity  . Worried About [facility] in the Last Year: Never true  . Ran Out of Food in the Last Year: Never true  Transportation Needs: No Transportation Needs  . Lack of Transportation (Medical): No  . Lack of Transportation (Non-Medical): No  Physical Activity: Not on file  Stress: Not on file  Social Connections: Not on file    Family History: Family History  Problem Relation Age of Onset  . Diabetes Mother   . Hypertension Mother     Allergies: No Known Allergies  Medications Prior to Admission  Medication Sig Dispense Refill Last Dose  . acetaminophen (TYLENOL) 325 MG tablet Take 2 tablets (650 mg total) by mouth every 6 (six) hours as needed (for pain scale < 4). (Patient not taking: Reported on 10/08/2020) 30 tablet 0   . docusate sodium (COLACE) 100  MG capsule Take 1 capsule (100 mg total) by mouth 2 (two) times daily as needed. (Patient not taking: Reported on 10/15/2020) 30 capsule 2   . Doxylamine-Pyridoxine 10-10 MG TBEC Take 2 tablets by mouth at bedtime. (Patient not taking: Reported on 10/08/2020) 60 tablet 2   . ondansetron (ZOFRAN ODT) 4 MG disintegrating tablet Take 1 tablet (4 mg total) by mouth every 6 (six) hours as needed for nausea. 45 tablet 0   . prenatal vitamin w/FE, FA (PRENATAL 1 + 1) 27-1 MG TABS tablet Take 1 tablet by mouth daily at 12 noon. 90 tablet 2   . promethazine (PHENERGAN) 25 MG tablet Take 1 tablet (25 mg total) by mouth every 6 (six) hours as needed for nausea or vomiting. 30 tablet 1      Review of Systems   All systems reviewed and negative except as stated in HPI  Blood pressure 118/71, pulse 73,  temperature (!) 96.7 F (35.9 C), temperature source Axillary, resp. rate 17, not currently breastfeeding. General appearance: alert, cooperative and appears stated age Lungs: normal WOB Heart: regular rate  Abdomen: soft, non-tender Extremities:  no sign of DVT Presentation: breech Fetal monitoring: unable to obtain FHT given precipitous nature of delivery    Prenatal labs: ABO, Rh: O/Positive/-- (01/26 1553) Antibody: Negative (01/26 1553) Rubella: 3.25 (01/26 1553) RPR: Non Reactive (01/26 1553)  HBsAg: Negative (01/26 1553)  HIV: Non Reactive (01/26 1553)  GBS: Negative/-- (05/24 1544)  1 hr Glucola not performed Genetic screening  wnl Anatomy US not performed  Prenatal Transfer Tool  Maternal Diabetes: not known Genetic Screening: Normal Maternal Ultrasounds/Referrals: no anatomy scan; evidence of large subchorionic hemorrhage at [redacted]w[redacted]d Fetal Ultrasounds or other Referrals:  None Maternal Substance Abuse:  No Significant Maternal Medications:  None Significant Maternal Lab Results: GBS unknown (no antibiotics prior to delivery given preterm)  No results found for this or any previous visit (from the past 24 hour(s)).  Patient Active Problem List   Diagnosis Date Noted  . Supervision of high risk pregnancy, antepartum 01/10/2021  . Low serum vitamin D 10/09/2020  . Grand multiparity 10/08/2020  . Vaginal bleeding affecting early pregnancy 10/08/2020  . Nausea and vomiting during pregnancy 10/08/2020  . Failed BTL (Filshie) 09/28/2020  . Sciatic nerve pain 07/27/2018  . Sickle cell trait (HCC) 02/15/2018  . Supervision of other normal pregnancy, antepartum 03/10/2016    Assessment/Plan:  Regina Ganci is a 25 y.o. [id number] at [redacted]w[redacted]d here for management of preterm labor.  #Preterm Labor  Vaginal Bleeding x2 weeks  H/o Failed BTL: suspect placental abruption given clinical presentation. Will manage expectantly given complete cervical dilation and bulging bag  on arrival to L&D. #Pain: TBD per pt request #FWB: Unable to obtain FHT prior to delivery #ID: GBS unknown--ordered ampicillin on arrival #MOF: bottle #MOC: undecided #Circ:  Undecided #Limited PNC: plan for SW consult in postpartum period  Druscilla Petsch, Skipper Cliche, MD OB Fellow, Faculty Practice 01/10/2021 9:22 PM

## 2021-01-10 NOTE — Lactation Note (Signed)
This note was copied from a baby's chart. Lactation Consultation Note  Patient Name: Boy Makalah [place]berry [id number] Date: 01/10/2021   Age:25 hours  Attempted to visit with mom of a < 1 hour pre-term NICU infant; L&D Crystal reported to [facility] that mom is planning on doing both breast/formula but that this was not a good time to see her (not even to work on hand expression and pumping expectations) because she was too emotional and asked LC to visit again once she gets transferred to her room. She'll be going to [facility] in [facility] care. LC to come back tomorrow to do lactation initial asst.   Maternal Data    Feeding    LATCH Score                    Lactation Tools Discussed/Used    Interventions    Discharge    Consult Status      Zavien Clubb Venetia Constable 01/10/2021, 9:35 PM

## 2021-01-11 ENCOUNTER — Other Ambulatory Visit: Payer: Self-pay

## 2021-01-11 ENCOUNTER — Encounter (HOSPITAL_COMMUNITY): Payer: Self-pay | Admitting: Obstetrics and Gynecology

## 2021-01-11 DIAGNOSIS — O99355 Diseases of the nervous system complicating the puerperium: Secondary | ICD-10-CM

## 2021-01-11 DIAGNOSIS — G8918 Other acute postprocedural pain: Secondary | ICD-10-CM

## 2021-01-11 LAB — RPR: RPR Ser Ql: NONREACTIVE

## 2021-01-11 NOTE — Progress Notes (Addendum)
Chaplain responded to RN request to check in on pt who is bearing a sense of overwhelming emotional shock, but also trying to be strong.  On the initial visit, pt welcomed chaplain with a huge smile. She has a very sweet and gentle personality.  Chaplain briefly met mother bedside, and then cousin, who arrived and stayed for some time.  Both family members were providing non-anxious, positive support to pt.   During the second visit, pt stated that pregnancy itself was a surprise, but then she came to terms with her new reality and prepared to welcome baby. But now, everything feels out of sync for her. Pt has fears about how she will manage her sick baby with her other young children at home. Pt is particularly anxious about imminent discharge because she doesn't want to leave the hospital without her baby.   Pt did refer to the baby's father without offering too much detail.  Says he is coming tonight, and focusing on things that need to get done. When chaplain followed up with, So he is focusing on baby and mother?Marland Kitchen..there was a hint of a pause and change of subject by cousin and pt.  Father is in picture, but current relationship status with mother is unclear.  When asked about how she can take care of herself, attend to her feelings/needs, pt states she "has to be strong" and feels that crying or "letting go" is something to avoid. Chaplain facilitated active listening and spiritual support, including mirroring and acknowledging pt's stated feelings of overload. Pt did begin to cry, with chaplain and cousin bedside. Cousin rubbed pt's back and offered words of support and love while chaplain provided positive, unconditional regard and mirroring of pt's feelings.  Pt did acknowledge that she is a [place] but not in any specific church at this time. Pt declined prayer at that time, but expressed appreciation for the visit and understanding that she can request a chaplain as needed.   Thank you for this  referral.  Please advise our department if pt desires further support.   Luana Shu [phone]     01/11/21 1800  Clinical Encounter Type  Visited With Patient and family together  Visit Type Initial;Other (Comment) (Premature birth/baby in NICU)  Referral From Nurse  Consult/Referral To Chaplain  Spiritual Encounters  Spiritual Needs Emotional;Other (Comment) (Spiritual support)  Stress Factors  Patient Stress Factors Family relationships;Loss of control;Major life changes;Exhausted  Family Stress Factors Family relationships

## 2021-01-11 NOTE — Plan of Care (Signed)
Patient was oriented to room and routine. Initial plan of care discussed with patient and significant other. Patient plans to bottle feed with formula.

## 2021-01-11 NOTE — Progress Notes (Signed)
Post Partum Day 1 SVD at 24 weeks Subjective: Pt very emotional as expected d/t preterm delivery. Reports infant stable in NICU. Pain controlled. Tolerating diet. Bleeding is min.   Objective: Blood pressure 110/70, pulse 69, temperature 98.3 F (36.8 C), temperature source Oral, resp. rate 16, SpO2 100 %, not currently breastfeeding.  Physical Exam:  General: alert Lochia: appropriate Uterine Fundus: firm Incision: NA DVT Evaluation: No evidence of DVT seen on physical exam.  Recent Labs    01/10/21 2110  HGB 11.1*  HCT 32.4*    Assessment/Plan: Stable. Events of delivery reviewed with pt. Will continue with routine postpartum care   LOS: 1 day   Shelby Serrano 01/11/2021, 8:24 AM

## 2021-01-11 NOTE — Lactation Note (Signed)
This note was copied from a baby's chart. Lactation Consultation Note Mother plans to bottle feed formula only. [facility] team not needed for consult at this time, per [facility] RN. Will see only if requested at a later time.   Patient Name: Shelby Serrano [id number] Date: 01/11/2021   Age:25 hours   Elder Negus, MA IBCLC 01/11/2021, 10:26 AM

## 2021-01-12 DIAGNOSIS — R109 Unspecified abdominal pain: Secondary | ICD-10-CM

## 2021-01-12 HISTORY — DX: Unspecified abdominal pain: R10.9

## 2021-01-12 LAB — GC/CHLAMYDIA PROBE AMP (~~LOC~~) NOT AT ARMC
Chlamydia: NEGATIVE
Comment: NEGATIVE
Comment: NORMAL
Neisseria Gonorrhea: NEGATIVE

## 2021-01-12 MED ORDER — CYCLOBENZAPRINE HCL 10 MG PO TABS: 10.0000 mg | ORAL_TABLET | Freq: Three times a day (TID) | ORAL | 0 refills | Status: DC | PRN

## 2021-01-12 MED ORDER — OXYCODONE HCL 5 MG PO TABS: 5.0000 mg | ORAL_TABLET | Freq: Four times a day (QID) | ORAL | 0 refills | Status: DC | PRN

## 2021-01-12 MED ORDER — IBUPROFEN 600 MG PO TABS: 600.0000 mg | ORAL_TABLET | Freq: Four times a day (QID) | ORAL | 0 refills | Status: DC

## 2021-01-12 MED ORDER — CYCLOBENZAPRINE HCL 10 MG PO TABS: 10.0000 mg | ORAL_TABLET | Freq: Three times a day (TID) | ORAL | Status: DC | PRN

## 2021-01-12 NOTE — Clinical Social Work Maternal (Signed)
CLINICAL SOCIAL WORK MATERNAL/CHILD NOTE  Patient Details  Name: Shelby Serrano MRN: [id number] Date of Birth: Sep 25, 1995  Date:  01/12/2021  Clinical Social Worker Initiating Note:  Laurey Arrow Date/Time: Initiated:  01/12/21/1427     Child's Name:  Shelby Serrano   Biological Parents:  Mother,Father   Need for Interpreter:  None   Reason for Referral:  Late or No Prenatal Care    Address:  [place] [address]    Phone number:  [phone] (home)     Additional phone number:   Household Members/Support Persons (HM/SP):   Household Member/Support Person 1,Household Member/Support Person 2,Household Member/Support Person 3,Household Member/Support Person 4,Household Member/Support Person 5   HM/SP Name Relationship DOB or Age  HM/SP -1 Burr Medico FOB    HM/SP -[place] (Per MOB, this child was removed from MOB's custody and) son 04/05/12  HM/SP -[place] daughter 07/15/2016  HM/SP -4 Burr Medico III son 08/17/18  HM/SP -[place] daughter 02/18/20  HM/SP -6        HM/SP -7        HM/SP -8          Natural Supports (not living in the home):  Immediate Family,Extended Soil scientist Supports: None   Employment: Full-time   Type of Work: Psychiatric nurse   Education:  9 to 11 years (MOB reported that MOB completed the 11th grade)   Homebound arranged: No  Financial Resources:  [employer]   Other Resources:  [place] [employer]    Cultural/Religious Considerations Which May Impact Care:  Per [employer], MOB is Engineer, manufacturing.  Strengths:  Ability to meet basic needs ,Home prepared for child ,Pediatrician chosen   Psychotropic Medications:         Pediatrician:    [place]  Pediatrician List:   [facility] [place]      Pediatrician Fax Number:    Risk Factors/Current  Problems:  Substance Use    Cognitive State:  Alert ,Linear Thinking ,Insightful ,Goal Oriented    Mood/Affect:  Interested ,Happy ,Relaxed ,Calm ,Comfortable    CSW Assessment: CSW met with MOB and FOB at infant's bedside in room 318 to complete and assessment for limited [facility] and NICU admission.  When CSW arrived, the couple was observing infant while infant was resting in his isolette. CSW explained CSW's role and MOB gave CSW permission to complete the assessment while FOB was present. The couple appeared supportive of one another and FOB was engaged with CSW.  MOB was polite, forthcoming, and receptive to meeting with CSW.   MOB shared her pregnancy story and communicated 'I got pregnant with Shelby after a failed tubal.  MOB shared feelings of frustration about her failed tubal and per MOB, she developed a distrust with her provider.  MOB communicated that she had limited PNC due to not trusting her provider and not being able to establish follow-up care with a different provider.   CSW made the couple aware of hospital's limited [name] care policy and the couple appeared to be understanding. MOB acknowledged the use or marijuana during her pregnancy and reported her last use was "About a month ago."  Per MOB, MOB smoked marijuana to increase her appetite and to decrease her nausea. The couple is aware that infant's UDS is  negative and CSW will continue to monitor infant's CDS and will make a report to [place] if warranted. Per MOB, MOB has CPS hx.  MOB explained that her oldest child was removed from her care by CPS and is currently residing in [facility] with her maternal Uncle and Elenor Legato Jaquita Rector and Alveta Heimlich  [phone]).  MOB shared that she had an open CPS case in 2017 after giving birth due to marijuana use. MOB communicated that she does not have any active involvement with CPS and she is aware of the CPS investigation process.  MOB denied the use of all other illicit substances  and declined resources for treatment.   The couple shared having a good support team and feeling comfortable reaching out for help if needed. They reported that they did not have an opportunity to purchase an essential items needed for infant but plans to in the near future. The couple is aware that CSW will provide resources if needed for essential items.   CSW provided education regarding the baby blues period vs. perinatal mood disorders, discussed treatment and gave resources for mental health follow up if concerns arise.  CSW recommends self-evaluation during the postpartum time period using the New Mom Checklist from Postpartum Progress and encouraged MOB to contact a medical professional if symptoms are noted at any time.     CSW explained infant's eligibility for SSI benefits and the couple expressed interest in applying.  CSW explained application process and the couple agreed to follow-up with CSW after they obtain infant's social security card.   CSW provide the couple with 6 meal vouchers.   CSW will continue to offer resources and supports to family while infant remains in NICU.    CSW Plan/Description:  Psychosocial Support and Ongoing Assessment of Needs,Sudden Infant Death Syndrome (SIDS) Education,Perinatal Mood and Anxiety Disorder (PMADs) Education,Other Patient/Family Education,Hospital Drug Screen Policy Information,Other Information/Referral to [employer] Will Continue to Monitor Umbilical Cord Tissue Drug Screen Results and Make Report if Warranted   Laurey Arrow, MSW, LCSW Clinical Social Work [phone]  Dimple Nanas, LCSW 01/12/2021, 2:36 PM

## 2021-01-12 NOTE — Progress Notes (Signed)
D/c instructions given, all questions answered, and pt verbalized understanding. Pain improved with medication from 10/10 to 7/10. VS WNL. Pt is alert and oriented x 4 and ambulatory. Pt states will take a shower and then go to NICU before leaving.

## 2021-01-12 NOTE — Plan of Care (Signed)
  Problem: Education: Goal: Knowledge of condition will improve Outcome: Adequate for Discharge Goal: Individualized Newborn Educational Video(s) Outcome: Adequate for Discharge   Problem: Activity: Goal: Will verbalize the importance of balancing activity with adequate rest periods Outcome: Adequate for Discharge   Problem: Coping: Goal: Ability to identify and utilize available resources and services will improve Outcome: Adequate for Discharge   Problem: Life Cycle: Goal: Chance of risk for complications during the postpartum period will decrease Outcome: Adequate for Discharge   Problem: Role Relationship: Goal: Ability to demonstrate positive interaction with newborn will improve Outcome: Adequate for Discharge

## 2021-01-13 ENCOUNTER — Encounter: Payer: Self-pay | Admitting: Obstetrics & Gynecology

## 2021-01-13 DIAGNOSIS — O411290 Chorioamnionitis, unspecified trimester, not applicable or unspecified: Secondary | ICD-10-CM

## 2021-01-13 HISTORY — DX: Chorioamnionitis, unspecified trimester, not applicable or unspecified: O41.1290

## 2021-01-13 LAB — SURGICAL PATHOLOGY

## 2021-01-13 NOTE — BH Specialist Note (Signed)
Pt did not arrive to video visit and did not answer the phone ; Voicemail not set and unable to leave voice message; left MyChart message for patient.

## 2021-01-14 ENCOUNTER — Encounter: Payer: Self-pay | Admitting: *Deleted

## 2021-01-14 ENCOUNTER — Ambulatory Visit: Payer: Self-pay

## 2021-01-14 ENCOUNTER — Telehealth: Payer: Self-pay | Admitting: *Deleted

## 2021-01-14 NOTE — Lactation Note (Addendum)
This note was copied from a baby's chart. Lactation Consultation Note  Patient Name: Shelby Serrano [id number] Date: 01/14/2021 Reason for consult: Follow-up assessment;NICU baby;1st time breastfeeding;Preterm <34wks Age:25 days   P5, CGA [redacted]w[redacted]d. Mother has decided to start pumping to provide breastmilk for her baby. Suggest discussing PRN medications with infant's physician.   RN set up DEBP.  Mother pumped 90 ml.  Praised her for her efforts and discussed benefits for baby. Provided mother with NICU booklet.  Mother has bottles, labels and manual pump. Discussed milk storage and labeling. Faxed pump referral to [facility]. Reviewed pumping frequency.     Lactation Tools Discussed/Used Tools: Pump Breast pump type: Double-Electric Breast Pump Pump Education: Setup, frequency, and cleaning;Milk Storage Reason for Pumping: preterm Pumping frequency: 8 times in 24 hours Pumped volume: 90 mL  Interventions Interventions: DEBP;Education   Consult Status Consult Status: Follow-up Date: 01/15/21 Follow-up type: In-patient    Dahlia Byes [facility] 01/14/2021, 12:06 PM

## 2021-01-14 NOTE — Telephone Encounter (Signed)
Call to patient. Advised surgery scheduled for 02-25-21 at 1300 at [facility].  Patient is agreeable to come into office tomorrow to sign consent- advised this must be completed 30 days ahead of surgery date. Advised will provide letter and hospital instructions when arrives tomorrow- also available on My Chart.  Encounter closed.

## 2021-01-15 ENCOUNTER — Other Ambulatory Visit: Payer: Self-pay

## 2021-01-15 ENCOUNTER — Encounter: Payer: Self-pay | Admitting: *Deleted

## 2021-01-15 ENCOUNTER — Ambulatory Visit (INDEPENDENT_AMBULATORY_CARE_PROVIDER_SITE_OTHER): Payer: Medicaid Other

## 2021-01-15 DIAGNOSIS — Z9889 Other specified postprocedural states: Secondary | ICD-10-CM

## 2021-01-15 NOTE — Progress Notes (Signed)
Pt here to sign BTL consent.  Consent signed and given to S. Raynelle Jan, RN   Judeth Cornfield, RN

## 2021-01-23 ENCOUNTER — Ambulatory Visit: Payer: Self-pay

## 2021-01-23 NOTE — Lactation Note (Signed)
This note was copied from a baby's chart. Lactation Consultation Note Per RN, mother is not pumping or providing her milk. Infant has received only donor milk since admission. LC services are complete. RN will notify [facility] team if further [facility] support is needed during infant's admission.   Patient Name: Shelby Serrano [id number] Date: 01/23/2021   Age:25 days   Elder Negus, MA IBCLC 01/23/2021, 10:55 AM

## 2021-01-26 ENCOUNTER — Ambulatory Visit: Payer: Medicaid Other | Admitting: Clinical

## 2021-01-26 DIAGNOSIS — Z5329 Procedure and treatment not carried out because of patient's decision for other reasons: Secondary | ICD-10-CM

## 2021-01-26 DIAGNOSIS — Z91199 Patient's noncompliance with other medical treatment and regimen due to unspecified reason: Secondary | ICD-10-CM

## 2021-02-04 ENCOUNTER — Ambulatory Visit: Payer: Medicaid Other | Admitting: Obstetrics & Gynecology

## 2021-02-10 ENCOUNTER — Telehealth: Payer: Self-pay | Admitting: *Deleted

## 2021-02-10 NOTE — Telephone Encounter (Signed)
Did not show for 02-04-21 appointment- message to provider.

## 2021-02-10 NOTE — Telephone Encounter (Signed)
Call to patient regarding planned BTSP on 02-25-21.  Patient is unaware of any insurance issue and will contact insurance regarding coverage. Given phone number to call back.

## 2021-02-19 NOTE — Telephone Encounter (Signed)
Surgery canceled

## 2021-02-25 ENCOUNTER — Encounter (HOSPITAL_BASED_OUTPATIENT_CLINIC_OR_DEPARTMENT_OTHER): Payer: Self-pay

## 2021-02-25 ENCOUNTER — Ambulatory Visit (HOSPITAL_BASED_OUTPATIENT_CLINIC_OR_DEPARTMENT_OTHER): Admit: 2021-02-25 | Payer: Medicaid Other | Admitting: Obstetrics & Gynecology

## 2021-02-25 SURGERY — SALPINGECTOMY, BILATERAL, LAPAROSCOPIC
Anesthesia: Choice | Laterality: Bilateral

## 2021-12-06 IMAGING — US US MFM OB LIMITED
1 series · 15 of 28 positions shown · non-contrast
Comparison: none

[Series 1: us mfm ob limited · 15 of 41 slices shown]
[im 1/41]
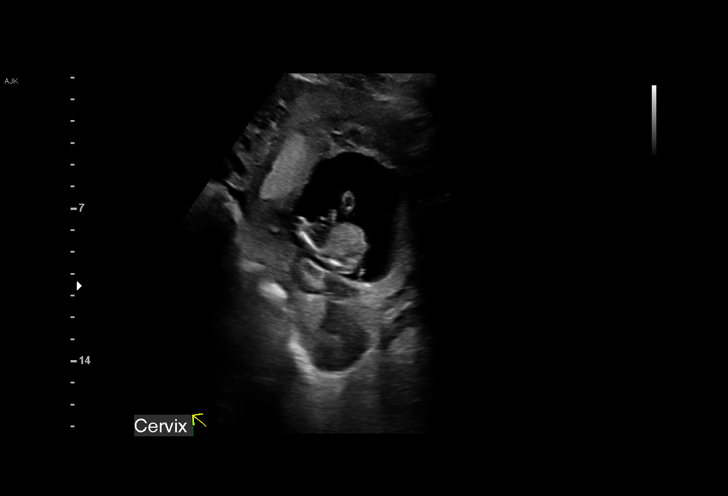
[im 3/41]
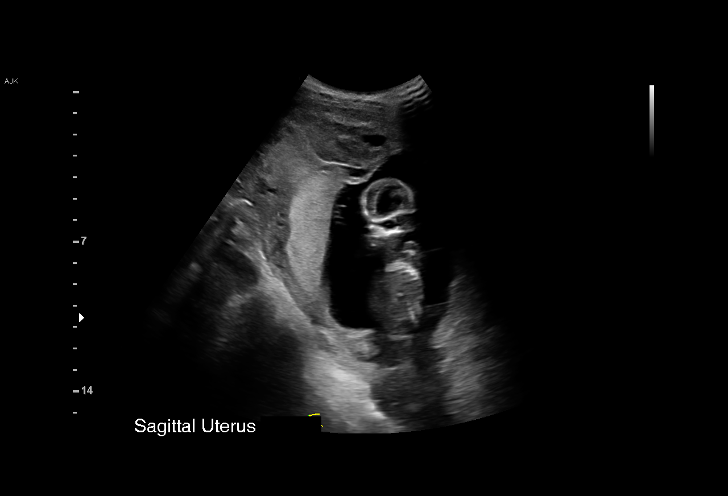
[im 6/41]
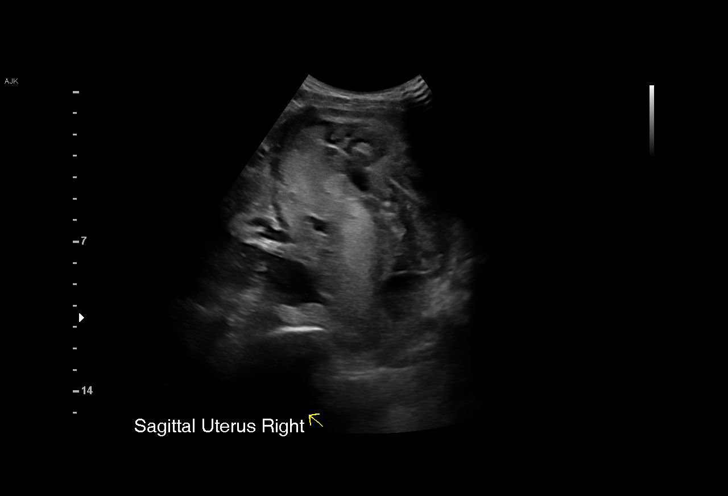
[im 9/41]
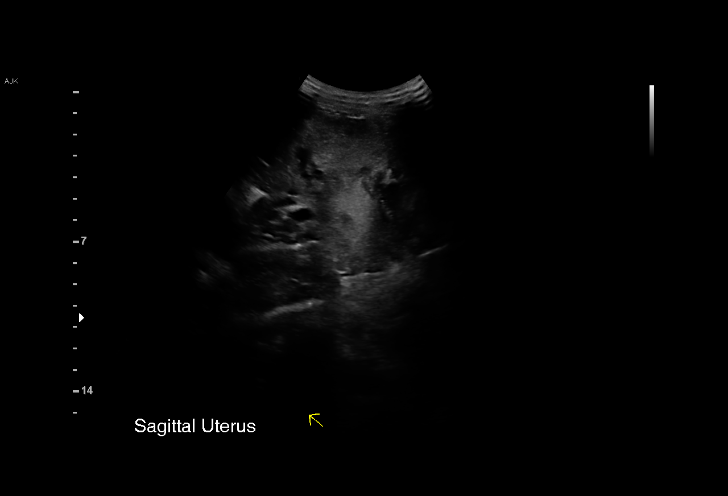
[im 12/41]
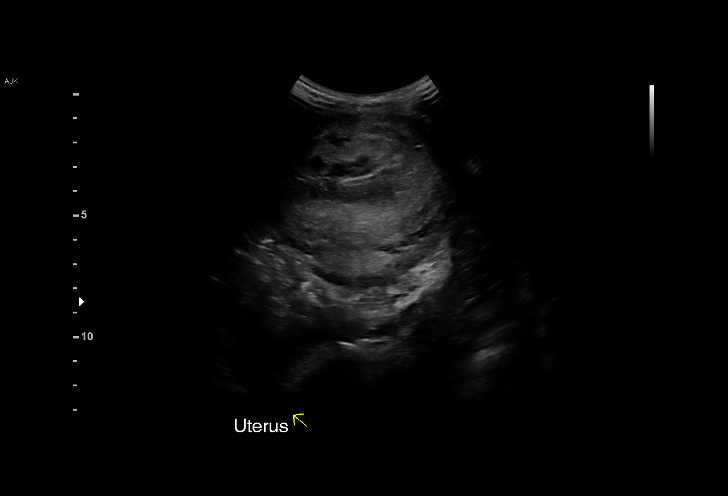
[im 15/41]
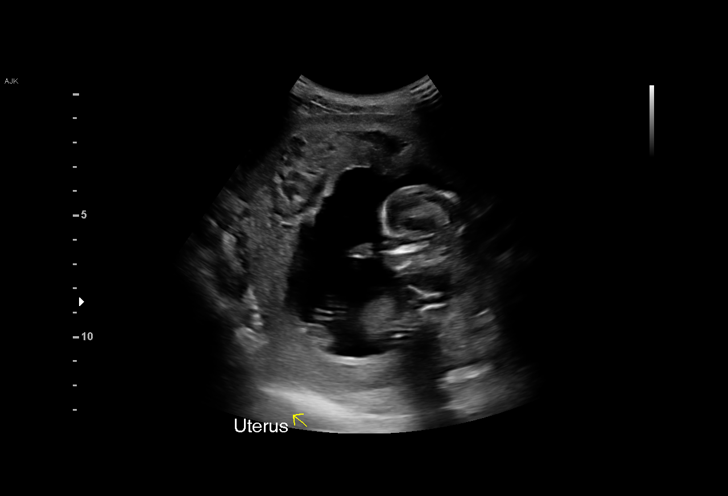
[im 18/41]
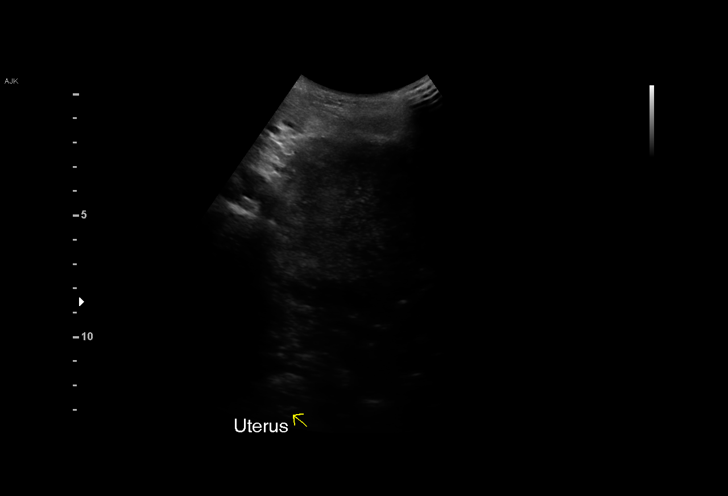
[im 21/41]
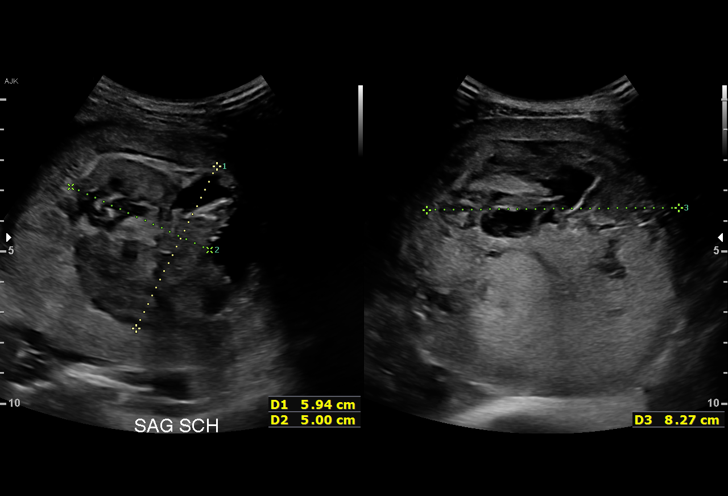
[im 23/41]
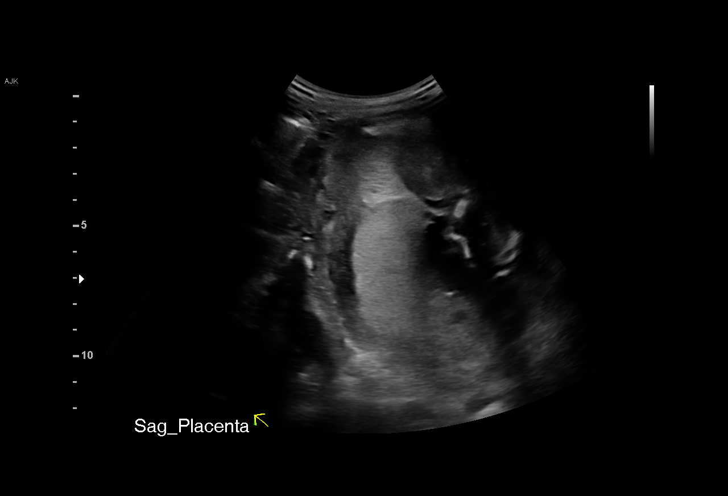
[im 26/41]
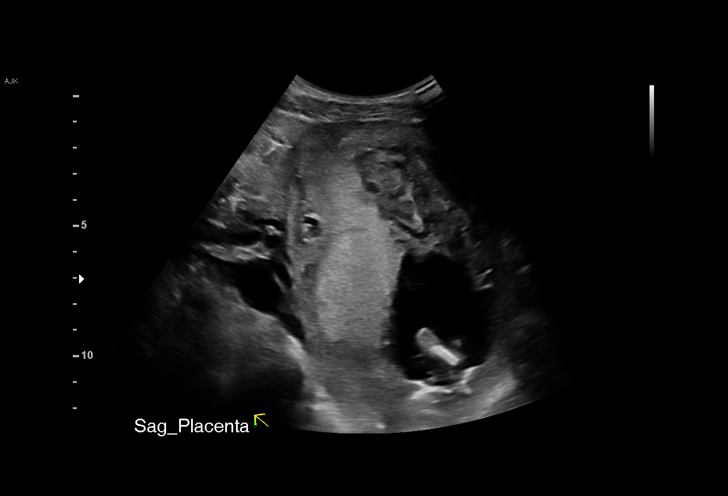
[im 29/41]
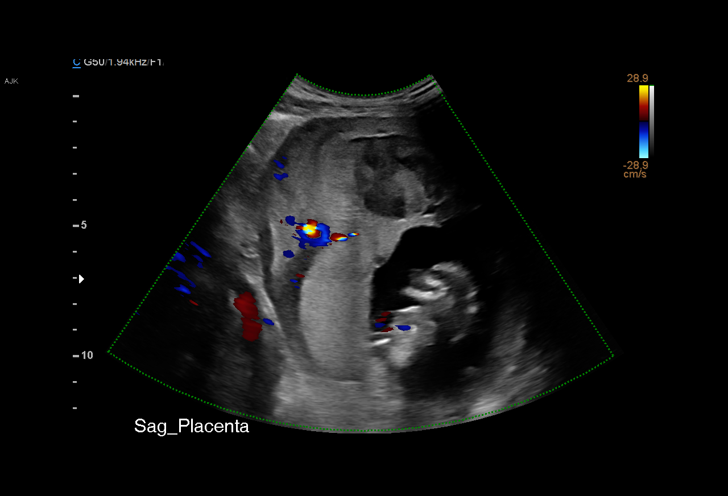
[im 32/41]
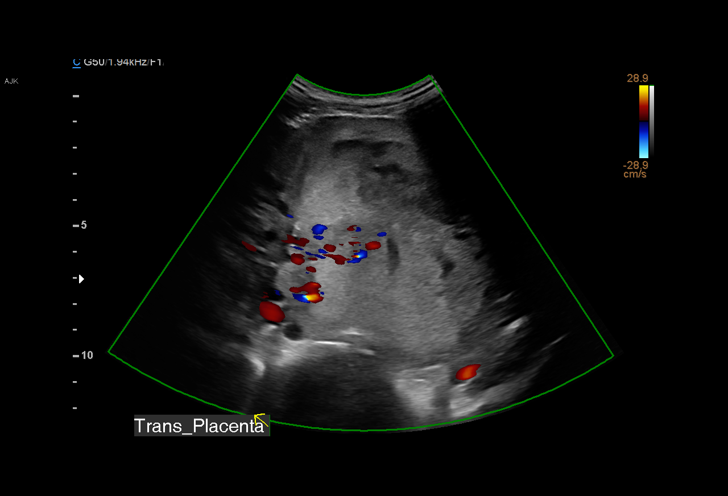
[im 35/41]
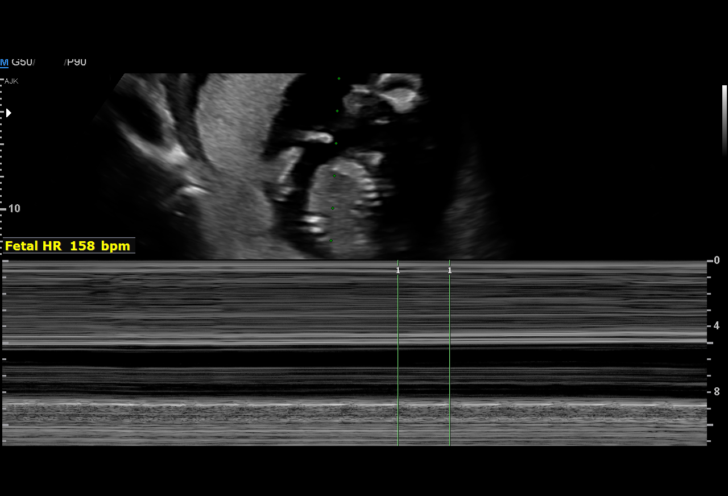
[im 38/41]
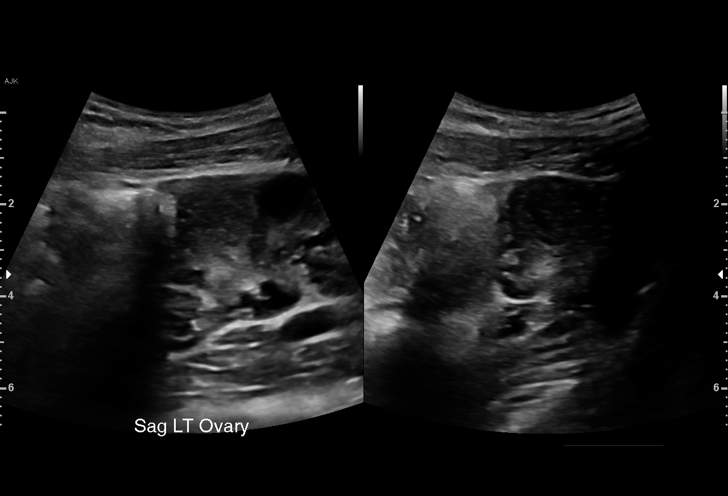
[im 41/41]
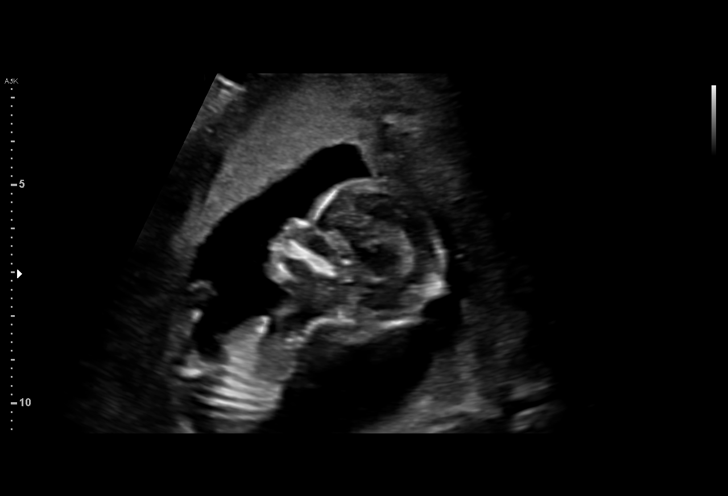

[15 of 28 positions shown; findings below may reference images not displayed]

1  US MFM OB LIMITED                     76815.01    ITSMARIVIC GLAWE

Indications

 14 weeks gestation of pregnancy
 Vaginal bleeding in pregnancy, second
 trimester
 Pelvic pain affecting pregnancy in second
 trimester
 Subchorionic hemorrhage, antepartum (seen
 in earlier U/S)
Fetal Evaluation

 Num Of Fetuses:         1
 Fetal Heart Rate(bpm):  158
 Cardiac Activity:       Observed
 Presentation:           Cephalic
 Placenta:               Posterior

 Amniotic Fluid
 AFI FV:      Within normal limits

                             Largest Pocket(cm)


 Comment:    Large subchorionic hemorrhage noted. (5.9x 5.0x 8.3cm)
OB History

 Gravidity:    8         Term:   4         SAB:   3
Gestational Age

 Best:          14w 2d     Det. By:  Previous Ultrasound      EDD:   04/27/21
                                     (09/05/20)
Cervix Uterus Adnexa
 Cervix
 Normal appearance by transabdominal scan.

 Uterus
 No abnormality visualized.

 Right Ovary
 Within normal limits.

 Left Ovary
 Within normal limits.

 Adnexa
 No abnormality visualized.
Impression

 Limited exam to assess vaginal bleeding
 Large subchorionic hemorrhage seen anteriorly
 There is no evidence of placental previa or placenta previa

 There is an increased risk for preterm delivery and premature
 rupture of membranes.
Recommendations

 A detailed exam at 18-20 weeks.

## 2022-06-21 ENCOUNTER — Emergency Department (HOSPITAL_COMMUNITY)
Admission: EM | Admit: 2022-06-21 | Discharge: 2022-06-22 | Discharge status: Against Medical Advice | Payer: Medicaid Other | Attending: Emergency Medicine | Admitting: Emergency Medicine

## 2022-06-21 ENCOUNTER — Encounter (HOSPITAL_COMMUNITY): Payer: Self-pay | Admitting: *Deleted

## 2022-06-21 ENCOUNTER — Emergency Department (HOSPITAL_COMMUNITY): Payer: Medicaid Other

## 2022-06-21 ENCOUNTER — Other Ambulatory Visit: Payer: Self-pay

## 2022-06-21 DIAGNOSIS — Z5321 Procedure and treatment not carried out due to patient leaving prior to being seen by health care provider: Secondary | ICD-10-CM | POA: Insufficient documentation

## 2022-06-21 DIAGNOSIS — M25571 Pain in right ankle and joints of right foot: Secondary | ICD-10-CM | POA: Diagnosis present

## 2022-06-21 DIAGNOSIS — R2241 Localized swelling, mass and lump, right lower limb: Secondary | ICD-10-CM | POA: Insufficient documentation

## 2022-06-21 DIAGNOSIS — W108XXA Fall (on) (from) other stairs and steps, initial encounter: Secondary | ICD-10-CM | POA: Diagnosis not present

## 2022-06-21 NOTE — ED Triage Notes (Signed)
Pt says she fell going up the steps on her porch this morning. She c/o right ankle pain and swelling.

## 2022-06-21 NOTE — ED Provider Triage Note (Signed)
Emergency Medicine Provider Triage Evaluation Note  Shelby Serrano , a 26 y.o. female  was evaluated in triage.  Pt complains of right ankle pain and left toe laceration after fall at 6am this morning. [place] while going up the stairs coming back into the house. Ankle has been swelling and more painful since.   Review of Systems  Positive: Ankle pain, toe laceration Negative:   Physical Exam  BP 125/84 (BP Location: Right Arm)   Pulse 75   Temp 98.1 F (36.7 C)   Resp 18   LMP 06/07/2022   SpO2 100%  Gen:   Awake, no distress   Resp:  Normal effort  MSK:   Moves extremities without difficulty  Other:  Right ankle appears swollen with some overlying ecchymoses, decreased ROM due to pain, normal sensation and capillary refill. Small laceration over the left great toe, no bleeding  Medical Decision Making  Medically screening exam initiated at 7:28 PM.  Appropriate orders placed.  Shelby Serrano was informed that the remainder of the evaluation will be completed by another provider, this initial triage assessment does not replace that evaluation, and the importance of remaining in the ED until their evaluation is complete.  X-ray ordered to r/o fracture   Fusae Florio T, PA-C 06/21/22 1929

## 2022-06-22 ENCOUNTER — Encounter (HOSPITAL_COMMUNITY): Payer: Self-pay | Admitting: Emergency Medicine

## 2022-06-22 ENCOUNTER — Ambulatory Visit (HOSPITAL_COMMUNITY)
Admission: EM | Admit: 2022-06-22 | Discharge: 2022-06-22 | Disposition: A | Discharge status: Home / Self Care | Payer: Medicaid Other | Attending: Family Medicine | Admitting: Family Medicine

## 2022-06-22 ENCOUNTER — Ambulatory Visit (INDEPENDENT_AMBULATORY_CARE_PROVIDER_SITE_OTHER): Payer: Medicaid Other

## 2022-06-22 DIAGNOSIS — M79671 Pain in right foot: Secondary | ICD-10-CM | POA: Diagnosis not present

## 2022-06-22 DIAGNOSIS — W19XXXA Unspecified fall, initial encounter: Secondary | ICD-10-CM | POA: Diagnosis not present

## 2022-06-22 MED ORDER — IBUPROFEN 800 MG PO TABS
ORAL_TABLET | ORAL | Status: AC
  Filled 2022-06-22: qty 1

## 2022-06-22 MED ORDER — IBUPROFEN 800 MG PO TABS
800.0000 mg | ORAL_TABLET | Freq: Once | ORAL | Status: AC
  Administered 2022-06-22: 800 mg via ORAL

## 2022-06-22 MED ORDER — NAPROXEN 500 MG PO TABS: 500.0000 mg | ORAL_TABLET | Freq: Two times a day (BID) | ORAL | 0 refills | Status: DC | PRN

## 2022-06-22 NOTE — Discharge Instructions (Addendum)
The x-rays of your foot did not show any acute bony abnormality  Take naproxen 500 mg--1 tablet every 12 hours as needed for pain  Ice and elevate your foot today and tomorrow.  Marland Kitchen

## 2022-06-22 NOTE — ED Notes (Signed)
Pt called for vitals and registration. No response.

## 2022-06-22 NOTE — ED Provider Notes (Signed)
[place]    CSN: [id number] Arrival date & time: 06/22/22  0807      History   Chief Complaint Chief Complaint  Patient presents with   Foot Pain    HPI Shelby Serrano is a 26 y.o. female.    Foot Pain   Here for right ankle and foot pain.  She was running up steps yesterday and tripped and fell and then immediately fell again onto her right foot and ankle.  She went to the emergency room yesterday and did have an x-ray there but had left to go sleep in her car moment and then had been taken out of the system to be seen.  X-ray of the ankle is negative; it understandably did not image the bones of the mid foot or forefoot  Past Medical History:  Diagnosis Date   Sickle cell trait ([place])     Patient Active Problem List   Diagnosis Date Noted   Acute chorioamnionitis 01/13/2021   Left sided abdominal pain 01/12/2021   Supervision of high risk pregnancy, antepartum 01/10/2021   Preterm delivery 01/10/2021   Low serum vitamin D 10/09/2020   Grand multiparity 10/08/2020   Vaginal bleeding affecting early pregnancy 10/08/2020   Nausea and vomiting during pregnancy 10/08/2020   Failed BTL ([place]) 09/28/2020   Sciatic nerve pain 07/27/2018   Sickle cell trait ([place]) 02/15/2018   Supervision of other normal pregnancy, antepartum 03/10/2016    Past Surgical History:  Procedure Laterality Date   TUBAL LIGATION Bilateral 02/18/2020   Procedure: POST PARTUM TUBAL LIGATION;  Surgeon: Aletha Halim, MD;  Location: [facility] LD ORS;  Service: Gynecology;  Laterality: Bilateral;    OB History     Gravida  8   Para  4   Term  4   Preterm  0   AB  3   Living  4      SAB  3   IAB  0   Ectopic  0   Multiple  0   Live Births  4            Home Medications    Prior to Admission medications   Not on File    Family History Family History  Problem Relation Age of Onset   Diabetes Mother    Hypertension Mother     Social  History Social History   Tobacco Use   Smoking status: Former    Packs/day: 1.00    Types: Cigarettes    Quit date: 02/08/2016    Years since quitting: 6.3   Smokeless tobacco: Never  Vaping Use   Vaping Use: Never used  Substance Use Topics   Alcohol use: No   Drug use: Not Currently    Types: Marijuana    Comment: Patient denies, Positive UDS 05/04/16 [facility] ED     Allergies   Patient has no known allergies.   Review of Systems Review of Systems   Physical Exam Triage Vital Signs ED Triage Vitals  Enc Vitals Group     BP 06/22/22 0830 115/86     Pulse Rate 06/22/22 0830 64     Resp 06/22/22 0830 15     Temp 06/22/22 0830 98.7 F (37.1 C)     Temp Source 06/22/22 0830 Oral     SpO2 06/22/22 0830 98 %     Weight --      Height --      Head Circumference --      Peak  Flow --      Pain Score 06/22/22 0828 10     Pain Loc --      Pain Edu? --      Excl. in GC? --    No data found.  Updated Vital Signs BP 115/86 (BP Location: Left Arm)   Pulse 64   Temp 98.7 F (37.1 C) (Oral)   Resp 15   LMP 06/07/2022   SpO2 98%   Visual Acuity Right Eye Distance:   Left Eye Distance:   Bilateral Distance:    Right Eye Near:   Left Eye Near:    Bilateral Near:     Physical Exam Vitals reviewed.  Constitutional:      General: She is not in acute distress.    Appearance: She is not ill-appearing, toxic-appearing or diaphoretic.  Musculoskeletal:     Comments: There is swelling and ecchymosis over the right lateral malleolus and onto the lateral dorsum of the midfoot.  The area of swelling and ecchymosis is about 10 cm x 3 cm  Skin:    Coloration: Skin is not jaundiced or pale.  Neurological:     General: No focal deficit present.     Mental Status: She is alert and oriented to person, place, and time.  Psychiatric:        Behavior: Behavior normal.      UC Treatments / Results  Labs (all labs ordered are listed, but only abnormal results are  displayed) Labs Reviewed - No data to display  EKG   Radiology DG Ankle Complete Right  Result Date: 06/21/2022 CLINICAL DATA:  Fall, ankle pain EXAM: RIGHT ANKLE - COMPLETE 3+ VIEW COMPARISON:  None Available. FINDINGS: No fracture or dislocation is seen. The ankle mortise is intact. The base of the fifth metatarsal is unremarkable. Visualized soft tissues are within normal limits. IMPRESSION: Negative. Electronically Signed   By: Charline Bills M.D.   On: 06/21/2022 19:49    Procedures Procedures (including critical care time)  Medications Ordered in UC Medications  ibuprofen (ADVIL) tablet 800 mg (has no administration in time range)    Initial Impression / Assessment and Plan / UC Course  I have reviewed the triage vital signs and the nursing notes.  Pertinent labs & imaging results that were available during my care of the patient were reviewed by me and considered in my medical decision making (see chart for details).       Since the x-ray done yesterday did not include where she is currently tender and swollen, a foot x-ray is ordered today  X-ray of the foot shows no acute fracture.  There is a round smooth piece of bone anterior to the calcaneus that radiologist feel is most likely due to an old injury  She cannot tell at present whether the ibuprofen we have given her orally has helped.  Naproxen is sent in for pain relief, I have asked her to ice and elevate that foot today and tomorrow.  Crutches are supplied and Ace wrap is done(pt had preferred oral tx for pain instead of toradol injection) Final Clinical Impressions(s) / UC Diagnoses   Final diagnoses:  None   Discharge Instructions   None    ED Prescriptions   None    I have reviewed the PDMP during this encounter.   Zenia Resides, MD 06/22/22 1001

## 2022-06-22 NOTE — ED Notes (Signed)
Called for vitals. No response 

## 2022-06-22 NOTE — ED Triage Notes (Signed)
Pt reports that she fell yesterday morning going up stairs. C/o right foot and ankle pain and swelling. Went to ED last night and went to her car due to wait time being long and fell asleep and when went back in they had taken her name out of system. Reports did have an xray in ED.

## 2022-06-30 ENCOUNTER — Ambulatory Visit: Payer: Medicaid Other | Admitting: Podiatry

## 2023-01-06 DIAGNOSIS — Z139 Encounter for screening, unspecified: Secondary | ICD-10-CM

## 2023-01-15 LAB — GLUCOSE, POCT (MANUAL RESULT ENTRY): POC Glucose: 85 mg/dl (ref 70–99)

## 2023-01-15 NOTE — Congregational Nurse Program (Signed)
Discussed her results of health screening,  Given gift card for attending.   Walta [place], RN, Congregational Nurse, [phone].    

## 2023-01-15 NOTE — Congregational Nurse Program (Signed)
Seen at [facility] Health screening.  BP 123/66, p. 67, BS 85, non fasting.  Juliann Pulse, RN, CNP, [phone].

## 2023-01-16 NOTE — Congregational Nurse Program (Signed)
New member.  Brief encounter at [facility].  Explained CN role.  Offered assistance as needed.   Dashonna Chagnon, RN, Congregational Nurse, [phone].     

## 2023-01-16 NOTE — Congregational Nurse Program (Signed)
Reviewed record to recommend seek PCP.  Juliann Pulse,  RN, CCNP, [phone]

## 2023-03-13 NOTE — Congregational Nurse Program (Signed)
Member seen at [facility].  Attended a first aid workshop and took home a first aid kit.  Participated in workshop discussion.  Daeja Helderman, RN  Congregational Nursing  [phone]    

## 2023-03-23 ENCOUNTER — Other Ambulatory Visit: Payer: Self-pay

## 2023-03-23 ENCOUNTER — Encounter (HOSPITAL_COMMUNITY): Payer: Self-pay

## 2023-03-23 ENCOUNTER — Emergency Department (HOSPITAL_COMMUNITY)
Admission: EM | Admit: 2023-03-23 | Discharge: 2023-03-23 | Discharge status: Against Medical Advice | Payer: Medicaid Other | Attending: Emergency Medicine | Admitting: Emergency Medicine

## 2023-03-23 DIAGNOSIS — T2601XA Burn of right eyelid and periocular area, initial encounter: Secondary | ICD-10-CM | POA: Diagnosis present

## 2023-03-23 DIAGNOSIS — T310 Burns involving less than 10% of body surface: Secondary | ICD-10-CM | POA: Diagnosis not present

## 2023-03-23 DIAGNOSIS — Y93G3 Activity, cooking and baking: Secondary | ICD-10-CM | POA: Diagnosis not present

## 2023-03-23 DIAGNOSIS — X101XXA Contact with hot food, initial encounter: Secondary | ICD-10-CM | POA: Insufficient documentation

## 2023-03-23 DIAGNOSIS — Z5321 Procedure and treatment not carried out due to patient leaving prior to being seen by health care provider: Secondary | ICD-10-CM | POA: Diagnosis not present
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
## 2023-03-23 NOTE — ED Notes (Signed)
Pt called 8 times by registration and twice by ed personnel.

## 2023-03-24 ENCOUNTER — Telehealth: Payer: Self-pay

## 2023-03-24 NOTE — Congregational Nurse Program (Signed)
Member seen at [facility] this morning.  Has bandage on right eye.  When asked about, she reports it was an "accident".  Will followup with her.  She helped another member who was having a medical crisis with the children.     When charting, saw she was in ER on 03/23/23 but left without being seen.  Will followup.   Juliann Pulse, RN, Congregational Nurse, [phone].
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
## 2023-03-26 ENCOUNTER — Telehealth: Payer: Self-pay
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
## 2023-04-25 NOTE — Telephone Encounter (Signed)
Left Message - Called to check on her. Encouraged her to seek medical care for her eye injury. Asked her to return my call. Juliann Pulse, RN, Congregational Nurse, [phone]

## 2023-05-23 ENCOUNTER — Ambulatory Visit (INDEPENDENT_AMBULATORY_CARE_PROVIDER_SITE_OTHER): Payer: Medicaid Other

## 2023-05-23 DIAGNOSIS — Z3201 Encounter for pregnancy test, result positive: Secondary | ICD-10-CM

## 2023-05-23 LAB — POCT PREGNANCY, URINE: Preg Test, Ur: POSITIVE — AB

## 2023-05-23 MED ORDER — PREPLUS 27-1 MG PO TABS: 1.0000 | ORAL_TABLET | Freq: Every day | ORAL | 11 refills | Status: DC

## 2023-05-23 NOTE — Addendum Note (Signed)
Addended by: Cinda Quest A on: 05/23/2023 11:28 AM   Modules accepted: Level of Service

## 2023-05-23 NOTE — Progress Notes (Signed)
Possible Pregnancy  Patient left urine sample today to be tested for pregnancy confirmation and would like to be called with results. UPT in office today is positive. Patient reports no positive at home test done, patient was tracking periods and saw that she had missed her period for August. Reviewed dating with patient:   LMP: 03/24/23--approximate; patient unsure of when last period started, but was having regular periods, so she believes it was sometime around this date.  EDD: 12/29/23 8w 4d today  Patient is now a G9P4 with hx of sickle cell trait; history of failed BTL on 02/18/20 and preterm labor and preterm delivery at [redacted]w[redacted]d on 01/10/21 (baby passed after delivery).   Patient scheduled for dating [place] on 05/25/23 at 1015.   Patient reports 10 out of 10 pain intermittently in arm, back, and chest with SOB; no bleeding. Advised patient that if pain returned, especially in chest with SOB, to head to MAU for evaluation. Also informed patient to go to MAU for any moderate to intense lower abdominal pain and/or vaginal bleeding similar to/heavier than a period for ectopic concerns or miscarriage concerns. Rx'd PNV's to patient's pharmacy. Message sent to front desk about scheduling New OB intake and New OB appointment. Patient verbalized understanding and had no further questions.   OB history reviewed. Reviewed medications and allergies with patient; list of medications safe to take during pregnancy given.  Recommended pt begin prenatal vitamin and schedule prenatal care.  Meryl Crutch, RN 05/23/2023  10:01 AM

## 2023-05-25 ENCOUNTER — Ambulatory Visit: Payer: Medicaid Other

## 2023-05-25 ENCOUNTER — Encounter: Payer: Self-pay | Admitting: Family Medicine

## 2023-05-25 ENCOUNTER — Other Ambulatory Visit: Payer: Self-pay

## 2023-05-25 DIAGNOSIS — Z3201 Encounter for pregnancy test, result positive: Secondary | ICD-10-CM | POA: Diagnosis not present

## 2023-05-25 DIAGNOSIS — Z3687 Encounter for antenatal screening for uncertain dates: Secondary | ICD-10-CM

## 2023-05-30 NOTE — Telephone Encounter (Signed)
Patient would like to discuss results about dating u/s with a nurse

## 2023-05-31 ENCOUNTER — Telehealth: Payer: Self-pay | Admitting: General Practice

## 2023-05-31 NOTE — Telephone Encounter (Signed)
Returned patient's phone call regarding ultrasound results. Patient asked about her official due date and any follow up appts. Reviewed dating information and upcoming appts on 10/1 & 10/9. Patient verbalized understanding.

## 2023-06-14 ENCOUNTER — Telehealth: Payer: Medicaid Other

## 2023-06-14 DIAGNOSIS — Z3A11 11 weeks gestation of pregnancy: Secondary | ICD-10-CM

## 2023-06-14 DIAGNOSIS — O0990 Supervision of high risk pregnancy, unspecified, unspecified trimester: Secondary | ICD-10-CM | POA: Insufficient documentation

## 2023-06-14 DIAGNOSIS — O0991 Supervision of high risk pregnancy, unspecified, first trimester: Secondary | ICD-10-CM

## 2023-06-14 MED ORDER — BLOOD PRESSURE KIT DEVI: 1.0000 | 0 refills | Status: DC | PRN

## 2023-06-14 NOTE — Patient Instructions (Addendum)
Safe Medications in Pregnancy   Acne:  Benzoyl Peroxide  Salicylic Acid   Backache/Headache:  Tylenol: 2 regular strength every 4 hours OR               2 Extra strength every 6 hours   Colds/Coughs/Allergies:  Benadryl (alcohol free) 25 mg every 6 hours as needed  Breath right strips  Claritin  Cepacol throat lozenges  Chloraseptic throat spray  Cold-Eeze- up to three times per day  Cough drops, alcohol free  Flonase (by prescription only)  Guaifenesin  Mucinex  Robitussin DM (plain only, alcohol free)  Saline nasal spray/drops  Sudafed (pseudoephedrine) & Actifed * use only after [redacted] weeks gestation and if you do not have high blood pressure  Tylenol  Vicks Vaporub  Zinc lozenges  Zyrtec   Constipation:  Colace  Ducolax suppositories  Fleet enema  Glycerin suppositories  Metamucil  Milk of magnesia  Miralax  Senokot  Smooth move tea   Diarrhea:  Kaopectate  Imodium A-D   *NO pepto Bismol   Hemorrhoids:  Anusol  Anusol HC  Preparation H  Tucks   Indigestion:  Tums  Maalox  Mylanta  Zantac  Pepcid   Insomnia:  Benadryl (alcohol free) 25mg every 6 hours as needed  Tylenol PM  Unisom, no Gelcaps   Leg Cramps:  Tums  MagGel   Nausea/Vomiting:  Bonine  Dramamine  Emetrol  Ginger extract  Sea bands  Meclizine  Nausea medication to take during pregnancy:  Unisom (doxylamine succinate 25 mg tablets) Take one tablet daily at bedtime. If symptoms are not adequately controlled, the dose can be increased to a maximum recommended dose of two tablets daily (1/2 tablet in the morning, 1/2 tablet mid-afternoon and one at bedtime).  Vitamin B6 100mg tablets. Take one tablet twice a day (up to 200 mg per day).   Skin Rashes:  Aveeno products  Benadryl cream or 25mg every 6 hours as needed  Calamine Lotion  1% cortisone cream   Yeast infection:  Gyne-lotrimin 7  Monistat 7    **If taking multiple medications, please check labels to avoid  duplicating the same active ingredients  **take medication as directed on the label  ** Do not exceed 4000 mg of tylenol in 24 hours  **Do not take medications that contain aspirin or ibuprofen             Considering Waterbirth? Guide for patients at [facility] ([facility]) Why consider waterbirth? Gentle birth for babies  Less pain medicine used in labor  May allow for passive descent/less pushing  May reduce perineal tears  More mobility and instinctive maternal position changes  Increased maternal relaxation   Is waterbirth safe? What are the risks of infection, drowning or other complications? Infection:  Very low risk (3.7 % for tub vs 4.8% for bed)  7 in 8000 waterbirths with documented infection  Poorly cleaned equipment most common cause  Slightly lower group B strep transmission rate  Drowning  Maternal:  Very low risk  Related to seizures or fainting  Newborn:  Very low risk. No evidence of increased risk of respiratory problems in multiple large studies  Physiological protection from breathing under water  Avoid underwater birth if there are any fetal complications  Once baby's head is out of the water, keep it out.  Birth complication  Some reports of cord trauma, but risk decreased by bringing baby to surface gradually  No evidence of increased risk of shoulder dystocia.   Mothers can usually change positions faster in water than in a bed, possibly aiding the maneuvers to free the shoulder.   There are 2 things you MUST do to have a waterbirth with [facility]: Attend a waterbirth class at [facility] [place]   3rd Wednesday of every month from 7-9 pm (virtual during COVID) Free Register online at [email or web address] or [email or web address] or by calling [phone] Bring us the certificate from the class to your prenatal appointment or send via MyChart Meet with a midwife at 36 weeks* to see if you can still plan a  waterbirth and to sign the consent.   *We also recommend that you schedule as many of your prenatal visits with a midwife as possible.    Helpful information: You may want to bring a bathing suit top to the hospital to wear during labor but this is optional.  All other supplies are provided by the hospital. Please arrive at the hospital with signs of active labor, and do not wait at home until late in labor. It takes 45 min- 1 hour for fetal monitoring, and check in to your room to take place, plus transport and filling of the waterbirth tub.    Things that would prevent you from having a waterbirth: Premature, <37wks  Previous cesarean birth  Presence of thick meconium-stained fluid  Multiple gestation (Twins, triplets, etc.)  Uncontrolled diabetes or gestational diabetes requiring medication  Hypertension diagnosed in pregnancy or preexisting hypertension (gestational hypertension, preeclampsia, or chronic hypertension) Fetal growth restriction (your baby measures less than 10th percentile on ultrasound) Heavy vaginal bleeding  Non-reassuring fetal heart rate  Active infection (MRSA, etc.). Group B Strep is NOT a contraindication for waterbirth.  If your labor has to be induced and induction method requires continuous monitoring of the baby's heart rate  Other risks/issues identified by your obstetrical provider   Please remember that birth is unpredictable. Under certain unforeseeable circumstances your provider may advise against giving birth in the tub. These decisions will be made on a case-by-case basis and with the safety of you and your baby as our highest priority.     

## 2023-06-14 NOTE — Progress Notes (Signed)
New OB Intake  I connected with Shelby Serrano  on 06/14/23 at  9:15 AM EDT by MyChart Video Visit and verified that I am speaking with the correct person using two identifiers. Nurse is located at [facility] and pt is located at home.  I discussed the limitations, risks, security and privacy concerns of performing an evaluation and management service by telephone and the availability of in person appointments. I also discussed with the patient that there may be a patient responsible charge related to this service. The patient expressed understanding and agreed to proceed.  I explained I am completing New OB Intake today. We discussed EDD of Not found.. Pt is [id number]. I reviewed her allergies, medications and Medical/Surgical/OB history.    Patient Active Problem List   Diagnosis Date Noted   Acute chorioamnionitis 01/13/2021   Left sided abdominal pain 01/12/2021   Supervision of high risk pregnancy, antepartum 01/10/2021   Preterm delivery 01/10/2021   Low serum vitamin D 10/09/2020   Grand multiparity 10/08/2020   Vaginal bleeding affecting early pregnancy 10/08/2020   Nausea and vomiting during pregnancy 10/08/2020   Failed BTL ([place]) 09/28/2020   Sciatic nerve pain 07/27/2018   Sickle cell trait ([facility]) 02/15/2018   Supervision of other normal pregnancy, antepartum 03/10/2016    Concerns addressed today  Delivery Plans Plans to deliver at [facility]. Discussed the nature of our practice with multiple providers including residents and students. Due to the size of the practice, the delivering provider may not be the same as those providing prenatal care.   Patient is interested in water birth. Offered upcoming OB visit with CNM to discuss further.  MyChart/Babyscripts MyChart access verified. I explained pt will have some visits in office and some virtually. Babyscripts instructions given and order placed. Patient verifies receipt of registration text/e-mail. Account  successfully created and app downloaded.  Blood Pressure Cuff/Weight Scale Blood pressure cuff ordered for patient to pick-up from [employer]. Explained after first prenatal appt pt will check weekly and document in Babyscripts.  Anatomy US Explained first scheduled [place] will be around 19 weeks. Anatomy US scheduled for 08/09/23 at 12:15pm.  Is patient a CenteringPregnancy candidate?  Declined Declined due to Declined to say  Is patient a Mom+Baby Combined Care candidate?  Not a candidate   If accepted, confirm patient does not intend to move from the area for at least 12 months, then notify Mom+Baby staff  Interested in [place]? If yes, send referral and doula dot phrase.   Is patient a candidate for Babyscripts Optimization? yes  First visit review I reviewed new OB appt with patient. Explained pt will be seen by Dr. Nobie Putnam at first visit. Discussed Avelina Laine genetic screening with patient.  Panorama and Horizon.. Routine prenatal labs is needed at new ob visit.  Last Pap Diagnosis  Date Value Ref Range Status  10/08/2020   Final   - Negative for intraepithelial lesion or malignancy (NILM)    B'Aisha T Perrin Gens, CMA 06/14/2023  8:40 AM

## 2023-06-14 NOTE — Addendum Note (Signed)
Addended by: Brien Mates T on: 06/14/2023 09:48 AM   Modules accepted: Orders

## 2023-06-22 ENCOUNTER — Other Ambulatory Visit (HOSPITAL_COMMUNITY)
Admission: RE | Admit: 2023-06-22 | Discharge: 2023-06-22 | Disposition: A | Discharge status: Home / Self Care | Payer: Medicaid Other | Source: Ambulatory Visit | Attending: Obstetrics & Gynecology | Admitting: Obstetrics & Gynecology

## 2023-06-22 ENCOUNTER — Ambulatory Visit (INDEPENDENT_AMBULATORY_CARE_PROVIDER_SITE_OTHER): Payer: Medicaid Other | Admitting: Family Medicine

## 2023-06-22 ENCOUNTER — Other Ambulatory Visit: Payer: Self-pay

## 2023-06-22 VITALS — BP 105/70 | HR 70 | Wt 104.5 lb

## 2023-06-22 DIAGNOSIS — Z31430 Encounter of female for testing for genetic disease carrier status for procreative management: Secondary | ICD-10-CM | POA: Diagnosis not present

## 2023-06-22 DIAGNOSIS — Z9889 Other specified postprocedural states: Secondary | ICD-10-CM

## 2023-06-22 DIAGNOSIS — O09891 Supervision of other high risk pregnancies, first trimester: Secondary | ICD-10-CM | POA: Diagnosis not present

## 2023-06-22 DIAGNOSIS — Z3490 Encounter for supervision of normal pregnancy, unspecified, unspecified trimester: Secondary | ICD-10-CM

## 2023-06-22 DIAGNOSIS — Z3A12 12 weeks gestation of pregnancy: Secondary | ICD-10-CM

## 2023-06-22 DIAGNOSIS — O0991 Supervision of high risk pregnancy, unspecified, first trimester: Secondary | ICD-10-CM

## 2023-06-22 DIAGNOSIS — O0990 Supervision of high risk pregnancy, unspecified, unspecified trimester: Secondary | ICD-10-CM | POA: Insufficient documentation

## 2023-06-22 DIAGNOSIS — O09899 Supervision of other high risk pregnancies, unspecified trimester: Secondary | ICD-10-CM | POA: Insufficient documentation

## 2023-06-22 DIAGNOSIS — Z1332 Encounter for screening for maternal depression: Secondary | ICD-10-CM | POA: Diagnosis not present

## 2023-06-22 MED ORDER — ASPIRIN 81 MG PO TBEC: 81.0000 mg | DELAYED_RELEASE_TABLET | Freq: Every day | ORAL | 12 refills | Status: DC

## 2023-06-22 NOTE — Progress Notes (Signed)
Subjective:   Shelby Serrano is a 27 y.o. [id number] at [redacted]w[redacted]d by LMP being seen today for her first obstetrical visit.  Her obstetrical history is significant for  sickle cell trait, failed tubal ligation (Filshie clips), grand multiparity . Patient does intend to breast feed. Pregnancy history fully reviewed.  Patient reports no complaints.  HISTORY: OB History  Gravida Para Term Preterm AB Living  9 5 4 1 3 4   SAB IAB Ectopic Multiple Live Births  3 0 0 0 5    # Outcome Date GA Lbr Len/2nd Weight Sex Type Anes PTL Lv  9 Current           8 Preterm 01/10/21 [redacted]w[redacted]d   M Vag-Spont   DEC  7 Term 02/18/20 [redacted]w[redacted]d 05:48 / 00:01 6 lb 5.6 oz (2.88 kg) F Vag-Spont None  LIV     Name: Goethe,GIRL Kasia     Apgar1: 9  Apgar5: 9  6 Term 08/17/18 [redacted]w[redacted]d 08:00 / 00:08 6 lb 0.8 oz (2.745 kg) M Vag-Spont None  LIV     Name: Honold,BOY Nathaniel     Apgar1: 9  Apgar5: 9  5 Term 07/15/16 [redacted]w[redacted]d 09:37 / 00:07 6 lb 0.1 oz (2.725 kg) F Vag-Spont EPI  LIV     Name: Radford,GIRL Elajah     Apgar1: 8  Apgar5: 9  4 SAB 07/2015 [redacted]w[redacted]d         3 SAB 04/2015          2 Term 04/05/12 [redacted]w[redacted]d  6 lb 2 oz (2.778 kg)  Vag-Spont   LIV     Birth Comments: no complications  1 SAB            Last pap smear was 10/08/2020 and was normal Past Medical History:  Diagnosis Date   Acute chorioamnionitis 01/13/2021   Noted on placental pathology        Failed BTL (Filshie) 09/28/2020   02/2020     Grand multiparity 10/08/2020   Left sided abdominal pain 01/12/2021   Low serum vitamin D 10/09/2020   [ ]  recheck at 28wks     Preterm delivery 01/10/2021   Preterm delivery at [redacted]w[redacted]d     Sickle cell trait [facility])    Supervision of high risk pregnancy, antepartum 01/10/2021   Supervision of other normal pregnancy, antepartum 03/10/2016              Nursing Staff    Provider      Office Location    [facility]    Dating      6wk u/s      Language     English    Anatomy US            Flu Vaccine          Genetic Screen     NIPS:  Low  risk           TDaP Vaccine          Hgb A1C or   GTT    Early   Third trimester       COVID Vaccine              LAB RESULTS       Rhogam     NA    Blood Type    --/--/O POS, O POS  Performed at [facility]   Vaginal bleeding affecting early pregnancy 10/08/2020   Past Surgical History:  Procedure Laterality Date  TUBAL LIGATION Bilateral 02/18/2020   Procedure: POST PARTUM TUBAL LIGATION;  Surgeon: Tripoli Bing, MD;  Location: [facility] LD ORS;  Service: Gynecology;  Laterality: Bilateral;   Family History  Problem Relation Age of Onset   Diabetes Mother    Hypertension Mother    Social History   Tobacco Use   Smoking status: Former    Current packs/day: 0.00    Types: Cigarettes    Quit date: 02/08/2016    Years since quitting: 7.3   Smokeless tobacco: Never  Vaping Use   Vaping status: Never Used  Substance Use Topics   Alcohol use: Not Currently   Drug use: Not Currently    Types: Marijuana    Comment: Patient denies, Positive UDS 05/04/16 [facility]   No Known Allergies Current Outpatient Medications on File Prior to Visit  Medication Sig Dispense Refill   Prenatal Vit-Fe Fumarate-FA (PREPLUS) 27-1 MG TABS Take 1 tablet by mouth daily. 30 tablet 11   Blood Pressure Monitoring (BLOOD PRESSURE KIT) DEVI 1 each by Does not apply route as needed. (Patient not taking: Reported on 06/22/2023) 1 each 0   naproxen (NAPROSYN) 500 MG tablet Take 1 tablet (500 mg total) by mouth 2 (two) times daily as needed (pain). (Patient not taking: Reported on 05/23/2023) 30 tablet 0   No current facility-administered medications on file prior to visit.     Exam   Vitals:   06/22/23 0932  BP: 105/70  Pulse: 70  Weight: 104 lb 8 oz (47.4 kg)   Fetal Heart Rate (bpm): 154  Uterus:     System: General: well-developed, well-nourished female in no acute distress   Breast:  normal appearance, no masses or tenderness   Skin: normal coloration and turgor, no rashes   Neurologic: oriented, normal,  negative, normal mood   Extremities: normal strength, tone, and muscle mass, ROM of all joints is normal   HEENT PERRLA, extraocular movement intact and sclera clear, anicteric   Mouth/Teeth mucous membranes moist, pharynx normal without lesions and dental hygiene good   Neck supple and no masses   Cardiovascular: regular rate and rhythm   Respiratory:  no respiratory distress, normal breath sounds   Abdomen: soft, non-tender; bowel sounds normal; no masses,  no organomegaly     Assessment:   Pregnancy: [id number] Patient Active Problem List   Diagnosis Date Noted   Supervision of high risk pregnancy, antepartum 06/14/2023     Plan:  1. Supervision of high risk pregnancy, antepartum Routine prenatal care Patient started on ASA due to African-American and family history of hypertensive disorders. - CBC/D/Plt+RPR+Rh+ABO+RubIgG... - GC/Chlamydia probe amp ([place])not at [facility] - Culture, OB Urine - Hemoglobin A1c - [place] MFM OB Transvaginal; Future - [place] MFM OB Comp Less 14 Wks; Future  2. Encounter of female for testing for genetic disease carrier status for procreative management - [employer]  3. Pregnancy - PANORAMA PRENATAL TEST   Initial labs drawn. Continue prenatal vitamins. Genetic Screening discussed, NIPS: ordered. Ultrasound discussed; fetal anatomic survey: ordered. Problem list reviewed and updated. The nature of [facility] with multiple MDs and other Advanced Practice Providers was explained to patient; also emphasized that residents, students are part of our team. Routine obstetric precautions reviewed. No follow-ups on file.

## 2023-06-23 ENCOUNTER — Encounter: Payer: Self-pay | Admitting: *Deleted

## 2023-06-23 LAB — CBC/D/PLT+RPR+RH+ABO+RUBIGG...
Antibody Screen: NEGATIVE
Basophils Absolute: 0 10*3/uL (ref 0.0–0.2)
Basos: 0 %
EOS (ABSOLUTE): 0.4 10*3/uL (ref 0.0–0.4)
Eos: 5 %
HCV Ab: NONREACTIVE
HIV Screen 4th Generation wRfx: NONREACTIVE
Hematocrit: 40.6 % (ref 34.0–46.6)
Hemoglobin: 13.9 g/dL (ref 11.1–15.9)
Hepatitis B Surface Ag: NEGATIVE
Immature Grans (Abs): 0 10*3/uL (ref 0.0–0.1)
Immature Granulocytes: 0 %
Lymphocytes Absolute: 1.1 10*3/uL (ref 0.7–3.1)
Lymphs: 14 %
MCH: 30.8 pg (ref 26.6–33.0)
MCHC: 34.2 g/dL (ref 31.5–35.7)
MCV: 90 fL (ref 79–97)
Monocytes Absolute: 0.5 10*3/uL (ref 0.1–0.9)
Monocytes: 6 %
Neutrophils Absolute: 5.9 10*3/uL (ref 1.4–7.0)
Neutrophils: 75 %
Platelets: 160 10*3/uL (ref 150–450)
RBC: 4.51 x10E6/uL (ref 3.77–5.28)
RDW: 12.1 % (ref 11.7–15.4)
RPR Ser Ql: NONREACTIVE
Rh Factor: POSITIVE
Rubella Antibodies, IGG: 2.39 {index} (ref >0.99)
WBC: 7.9 10*3/uL (ref 3.4–10.8)

## 2023-06-23 LAB — GC/CHLAMYDIA PROBE AMP (~~LOC~~) NOT AT ARMC
Chlamydia: NEGATIVE
Comment: NEGATIVE
Comment: NORMAL
Neisseria Gonorrhea: NEGATIVE

## 2023-06-23 LAB — HCV INTERPRETATION

## 2023-06-23 LAB — HEMOGLOBIN A1C
Est. average glucose Bld gHb Est-mCnc: 103 mg/dL
Hgb A1c MFr Bld: 5.2 % (ref 4.8–5.6)

## 2023-06-24 LAB — URINE CULTURE, OB REFLEX

## 2023-06-24 LAB — CULTURE, OB URINE

## 2023-06-29 LAB — PANORAMA PRENATAL TEST FULL PANEL:PANORAMA TEST PLUS 5 ADDITIONAL MICRODELETIONS: FETAL FRACTION: 15.3

## 2023-06-30 ENCOUNTER — Other Ambulatory Visit: Payer: Self-pay

## 2023-06-30 ENCOUNTER — Ambulatory Visit: Payer: Medicaid Other | Attending: Family Medicine

## 2023-06-30 ENCOUNTER — Ambulatory Visit: Payer: Medicaid Other | Attending: Maternal & Fetal Medicine | Admitting: Maternal & Fetal Medicine

## 2023-06-30 ENCOUNTER — Ambulatory Visit: Payer: Medicaid Other | Admitting: *Deleted

## 2023-06-30 ENCOUNTER — Encounter: Payer: Self-pay | Admitting: *Deleted

## 2023-06-30 ENCOUNTER — Other Ambulatory Visit: Payer: Self-pay | Admitting: *Deleted

## 2023-06-30 VITALS — BP 132/68 | HR 67

## 2023-06-30 DIAGNOSIS — O09899 Supervision of other high risk pregnancies, unspecified trimester: Secondary | ICD-10-CM

## 2023-06-30 DIAGNOSIS — O0990 Supervision of high risk pregnancy, unspecified, unspecified trimester: Secondary | ICD-10-CM

## 2023-06-30 DIAGNOSIS — Z3A13 13 weeks gestation of pregnancy: Secondary | ICD-10-CM | POA: Diagnosis not present

## 2023-06-30 DIAGNOSIS — O4411 Placenta previa with hemorrhage, first trimester: Secondary | ICD-10-CM

## 2023-06-30 DIAGNOSIS — O09211 Supervision of pregnancy with history of pre-term labor, first trimester: Secondary | ICD-10-CM

## 2023-06-30 DIAGNOSIS — O285 Abnormal chromosomal and genetic finding on antenatal screening of mother: Secondary | ICD-10-CM

## 2023-06-30 DIAGNOSIS — Z8759 Personal history of other complications of pregnancy, childbirth and the puerperium: Secondary | ICD-10-CM | POA: Diagnosis not present

## 2023-06-30 DIAGNOSIS — D573 Sickle-cell trait: Secondary | ICD-10-CM

## 2023-06-30 DIAGNOSIS — O4431 Partial placenta previa with hemorrhage, first trimester: Secondary | ICD-10-CM

## 2023-06-30 DIAGNOSIS — O468X1 Other antepartum hemorrhage, first trimester: Secondary | ICD-10-CM

## 2023-06-30 NOTE — Progress Notes (Signed)
Patient information  Patient Name: Shelby Serrano  Patient MRN:   [id number]  Referring practice: MFM Referring Provider: [facility])  MFM CONSULT  Shelby Serrano is a 27 y.o. [phone] at [redacted]w[redacted]d here for ultrasound and consultation.   RE PTB at [redacted]w[redacted]d: The patient has a history of what sounds like placental abruption at 24 weeks 5 days and a previous pregnancy resulting in the birth of a newborn who lives approximately 2 weeks and then demised.  The patient reports that she experienced sudden onset abdominal pain while in the kitchen followed by vaginal bleeding and by the time she arrived at the hospital she was told that she was delivering the fetus.  I discussed that this is consistent with a placental abruption with a recurrence rate of about 10% for the next pregnancy and 25% with 2 prior placental abruption's.  Risk factors for placental abruption would be elevated blood pressure, elevated MSAFP, trauma, drugs/tobacco, genetic abnormalities,  and coagulopathies/antiphospholipid syndrome.  I discussed the importance of modification of activity as well as monitoring for bleeding and to go to the hospital with any concerns.  Antiphospholipid syndrome antibodies as well as and maternal serum alpha-fetoprotein should be ordered at her next prenatal visit.    RE anterior placenta,  subchorionic hemorrhage: There is evidence of a 5 cm x 2-1/2 cm subchorionic hemorrhage.  This extends inferiorly from the placental edge near the cervix.  There is also what appears to be an anterior placenta although it is very early in the gestation this is commonly seen.  She is too early for transvaginal cervical length due to lack of development of the lower uterine segment. The patient reports that she has had some bleeding this pregnancy but it stopped 4 days ago. She is aware of bleeding precautions and will come back in 3 weeks for transvaginal ultrasound for cervical length  measurement.  Sonographic findings Single intrauterine pregnancy at 13w 2d. Fetal cardiac activity:  Observed and appears normal. Presentation: Variable. The anatomic structures that were well seen appear normal without evidence of soft markers. Due to poor acoustic windows some structures remain suboptimally visualized. Amniotic fluid: Appears normal Placenta: Anterior previa with a 5.3 x 2.1 cm area of hemorrhage.  Adnexa: No abnormality visualized.  Cervical length: too early to measure  Assessment 1. History of placental abruption 2. History of preterm delivery, currently pregnant 3. [redacted] weeks gestation of pregnancy 4. Placental previa   Plan -Antiphospholipid syndrome antibodies as well as and maternal serum alpha-fetoprotein should be ordered at her next prenatal visit. -Transvaginal cervical length at 16 weeks -Anatomy ultrasound with transvaginal cervical length at 19 weeks -Serial growth ultrasounds in the third trimester  Review of Systems: A review of systems was performed and was negative except per HPI   Vitals and Physical Exam    06/30/2023    8:27 AM 06/22/2023    9:32 AM 03/23/2023    6:54 PM  Vitals with BMI  Height   5\' 1"   Weight  104 lbs 8 oz 102 lbs 15 oz  BMI   19.46  Systolic 132 105   Diastolic 68 70   Pulse 67 70   Sitting comfortably on the sonogram table Nonlabored breathing Normal rate and rhythm Abdomen is nontender  Past pregnancies OB History  Gravida Para Term Preterm AB Living  9 5 4 1 3 4   SAB IAB Ectopic Multiple Live Births  3 0 0 0 5    #  Outcome Date GA Lbr Len/2nd Weight Sex Type Anes PTL Lv  9 Current           8 Preterm 01/10/21 [redacted]w[redacted]d   M Vag-Spont   DEC  7 Term 02/18/20 [redacted]w[redacted]d 05:48 / 00:01 2.88 kg F Vag-Spont None  LIV  6 Term 08/17/18 [redacted]w[redacted]d 08:00 / 00:08 2.745 kg M Vag-Spont None  LIV  5 Term 07/15/16 [redacted]w[redacted]d 09:37 / 00:07 2.725 kg F Vag-Spont EPI  LIV  4 SAB 07/2015 [redacted]w[redacted]d         3 SAB 04/2015          2 Term 04/05/12  [redacted]w[redacted]d  2.778 kg  Vag-Spont   LIV     Birth Comments: no complications  1 SAB               I spent 60 minutes reviewing the patients chart, including labs and images as well as counseling the patient about her medical conditions. Greater than 50% of the time was spent in direct face-to-face patient counseling.  Braxton Feathers  MFM, [facility]   06/30/2023  9:35 AM

## 2023-07-02 LAB — HORIZON CUSTOM: REPORT SUMMARY: POSITIVE — AB

## 2023-07-07 ENCOUNTER — Encounter: Payer: Self-pay | Admitting: *Deleted

## 2023-07-07 DIAGNOSIS — Z9851 Tubal ligation status: Secondary | ICD-10-CM | POA: Insufficient documentation

## 2023-07-20 ENCOUNTER — Ambulatory Visit (INDEPENDENT_AMBULATORY_CARE_PROVIDER_SITE_OTHER): Payer: Medicaid Other | Admitting: Obstetrics and Gynecology

## 2023-07-20 ENCOUNTER — Other Ambulatory Visit: Payer: Self-pay

## 2023-07-20 VITALS — BP 97/57 | HR 68 | Wt 107.0 lb

## 2023-07-20 DIAGNOSIS — O4402 Placenta previa specified as without hemorrhage, second trimester: Secondary | ICD-10-CM | POA: Insufficient documentation

## 2023-07-20 DIAGNOSIS — O0990 Supervision of high risk pregnancy, unspecified, unspecified trimester: Secondary | ICD-10-CM

## 2023-07-20 DIAGNOSIS — O0992 Supervision of high risk pregnancy, unspecified, second trimester: Secondary | ICD-10-CM

## 2023-07-20 DIAGNOSIS — Z8759 Personal history of other complications of pregnancy, childbirth and the puerperium: Secondary | ICD-10-CM

## 2023-07-20 DIAGNOSIS — Z23 Encounter for immunization: Secondary | ICD-10-CM | POA: Diagnosis not present

## 2023-07-20 DIAGNOSIS — Z3A16 16 weeks gestation of pregnancy: Secondary | ICD-10-CM

## 2023-07-20 NOTE — Progress Notes (Signed)
   PRENATAL VISIT NOTE  Subjective:  Shelby Serrano is a 27 y.o. [phone] at [redacted]w[redacted]d being seen today for ongoing prenatal care.  She is currently monitored for the following issues for this high-risk pregnancy and has Supervision of high risk pregnancy, antepartum; History of preterm delivery, currently pregnant; History of placental abruption; Surgical history of tubal ligation; and Placenta previa in second trimester on their problem list.  Patient reports no complaints.  Contractions: Not present. Vag. Bleeding: None.   . Denies leaking of fluid.   The following portions of the patient's history were reviewed and updated as appropriate: allergies, current medications, past family history, past medical history, past social history, past surgical history and problem list.   Objective:   Vitals:   07/20/23 1100  BP: (!) 97/57  Pulse: 68  Weight: 107 lb (48.5 kg)    Fetal Status: Fetal Heart Rate (bpm): 152         General:  Alert, oriented and cooperative. Patient is in no acute distress.  Skin: Skin is warm and dry. No rash noted.   Cardiovascular: Normal heart rate noted  Respiratory: Normal respiratory effort, no problems with respiration noted  Abdomen: Soft, gravid, appropriate for gestational age.  Pain/Pressure: Present (Pressure)     Pelvic: Cervical exam deferred        Extremities: Normal range of motion.  Edema: None  Mental Status: Normal mood and affect. Normal behavior. Normal judgment and thought content.   Assessment and Plan:  Pregnancy: [id number] at [redacted]w[redacted]d 1. Supervision of high risk pregnancy, antepartum F/u anatomy u/s - Flu vaccine trivalent PF, 6mos and older(Flulaval,Afluria,Fluarix,Fluzone) - AFP, Serum, Open Spina Bifida - Antiphospholipid Syndrome Comp  2. [redacted] weeks gestation of pregnancy - AFP, Serum, Open Spina Bifida - Antiphospholipid Syndrome Comp  3. History of placenta abruption 2022 24wk abruption with PTL/PTB and subsequent neonatal  demise. F/u AFP and APS labs. MFM following with cervical lengths - AFP, Serum, Open Spina Bifida - Antiphospholipid Syndrome Comp  4. Placenta previa in second trimester No s/s  Preterm labor symptoms and general obstetric precautions including but not limited to vaginal bleeding, contractions, leaking of fluid and fetal movement were reviewed in detail with the patient. Please refer to After Visit Summary for other counseling recommendations.   No follow-ups on file.  Future Appointments  Date Time Provider Department Center  07/21/2023  1:30 PM [facility] US5 [facility]  08/03/2023 12:30 PM [facility] US2 [facility]  08/19/2023 10:15 AM Warden Fillers, MD [facility]    [place] Bing, MD

## 2023-07-21 ENCOUNTER — Ambulatory Visit: Payer: Medicaid Other | Attending: Maternal & Fetal Medicine

## 2023-07-21 ENCOUNTER — Other Ambulatory Visit: Payer: Self-pay

## 2023-07-21 DIAGNOSIS — D573 Sickle-cell trait: Secondary | ICD-10-CM

## 2023-07-21 DIAGNOSIS — O4592 Premature separation of placenta, unspecified, second trimester: Secondary | ICD-10-CM | POA: Diagnosis not present

## 2023-07-21 DIAGNOSIS — O99012 Anemia complicating pregnancy, second trimester: Secondary | ICD-10-CM | POA: Diagnosis not present

## 2023-07-21 DIAGNOSIS — O09899 Supervision of other high risk pregnancies, unspecified trimester: Secondary | ICD-10-CM | POA: Insufficient documentation

## 2023-07-21 DIAGNOSIS — O468X1 Other antepartum hemorrhage, first trimester: Secondary | ICD-10-CM | POA: Insufficient documentation

## 2023-07-21 DIAGNOSIS — O418X10 Other specified disorders of amniotic fluid and membranes, first trimester, not applicable or unspecified: Secondary | ICD-10-CM | POA: Insufficient documentation

## 2023-07-21 DIAGNOSIS — O09212 Supervision of pregnancy with history of pre-term labor, second trimester: Secondary | ICD-10-CM | POA: Diagnosis not present

## 2023-07-21 DIAGNOSIS — Z3A16 16 weeks gestation of pregnancy: Secondary | ICD-10-CM

## 2023-07-29 LAB — AFP, SERUM, OPEN SPINA BIFIDA
AFP MoM: 1.64
AFP Value: 75.2 ng/mL
Gest. Age on Collection Date: 16 wk
Maternal Age At EDD: 27.7 a
OSBR Risk 1 IN: 3791
Test Results:: NEGATIVE
Weight: 107 [lb_av]

## 2023-07-29 LAB — ANTIPHOSPHOLIPID SYNDROME COMP
APTT: 27.1 s
Anticardiolipin Ab, IgA: <10 [APL'U]
Anticardiolipin Ab, IgG: <10 [GPL'U]
Anticardiolipin Ab, IgM: <10 [MPL'U]
Antiphosphatidylserine IgG: 0 {GPS'U}
Antiphosphatidylserine IgM: 0 {MPS'U}
Antiprothrombin Antibody, IgG: 1 G units
Beta-2 Glycoprotein I, IgA: <10 SAU
Beta-2 Glycoprotein I, IgG: <10 SGU
Beta-2 Glycoprotein I, IgM: <10 SMU
DRVVT Screen Seconds: 32.6 s
Hexagonal Phospholipid Neutral: 3 s
Platelet Neutralization: 1 s

## 2023-08-03 ENCOUNTER — Ambulatory Visit: Payer: 59 | Attending: Maternal & Fetal Medicine

## 2023-08-03 ENCOUNTER — Other Ambulatory Visit: Payer: Self-pay | Admitting: *Deleted

## 2023-08-03 DIAGNOSIS — O0942 Supervision of pregnancy with grand multiparity, second trimester: Secondary | ICD-10-CM

## 2023-08-03 DIAGNOSIS — O99012 Anemia complicating pregnancy, second trimester: Secondary | ICD-10-CM | POA: Diagnosis not present

## 2023-08-03 DIAGNOSIS — O09292 Supervision of pregnancy with other poor reproductive or obstetric history, second trimester: Secondary | ICD-10-CM | POA: Diagnosis not present

## 2023-08-03 DIAGNOSIS — O4592 Premature separation of placenta, unspecified, second trimester: Secondary | ICD-10-CM

## 2023-08-03 DIAGNOSIS — O468X1 Other antepartum hemorrhage, first trimester: Secondary | ICD-10-CM | POA: Insufficient documentation

## 2023-08-03 DIAGNOSIS — O09212 Supervision of pregnancy with history of pre-term labor, second trimester: Secondary | ICD-10-CM

## 2023-08-03 DIAGNOSIS — O09899 Supervision of other high risk pregnancies, unspecified trimester: Secondary | ICD-10-CM | POA: Diagnosis present

## 2023-08-03 DIAGNOSIS — Z363 Encounter for antenatal screening for malformations: Secondary | ICD-10-CM | POA: Insufficient documentation

## 2023-08-03 DIAGNOSIS — Z148 Genetic carrier of other disease: Secondary | ICD-10-CM | POA: Diagnosis not present

## 2023-08-03 DIAGNOSIS — D573 Sickle-cell trait: Secondary | ICD-10-CM | POA: Diagnosis not present

## 2023-08-03 DIAGNOSIS — Z362 Encounter for other antenatal screening follow-up: Secondary | ICD-10-CM

## 2023-08-03 DIAGNOSIS — Z3A18 18 weeks gestation of pregnancy: Secondary | ICD-10-CM | POA: Diagnosis not present

## 2023-08-03 DIAGNOSIS — O418X10 Other specified disorders of amniotic fluid and membranes, first trimester, not applicable or unspecified: Secondary | ICD-10-CM | POA: Diagnosis present

## 2023-08-03 DIAGNOSIS — Z3686 Encounter for antenatal screening for cervical length: Secondary | ICD-10-CM | POA: Insufficient documentation

## 2023-08-09 ENCOUNTER — Other Ambulatory Visit: Payer: Medicaid Other

## 2023-08-09 ENCOUNTER — Ambulatory Visit: Payer: Medicaid Other

## 2023-08-19 ENCOUNTER — Encounter: Payer: 59 | Admitting: Obstetrics and Gynecology

## 2023-09-14 NOTE — L&D Delivery Note (Addendum)
 Delivery Note Shelby Serrano is a 28 y.o. [id number] at [redacted]w[redacted]d admitted for SOL.   GBS Status: Negative/-- (03/26 1320) Maximum Maternal Temperature: 98.4  Labor course: Augmentation with: AROM. She then progressed to complete.  ROM: 0h 39m with clear fluid  Birth: At 10:52 a viable female was delivered via spontaneous vaginal delivery (Presentation:OA  ). Nuchal cord present: No.  Shoulders and body delivered in usual fashion. Infant placed directly on mom's abdomen for bonding/skin-to-skin, baby dried and stimulated. Cord clamped x 2 after 1 minute and cut by FOB.  Cord blood collected.  The placenta separated spontaneously and delivered via gentle cord traction.  200 mL gush of brisk bleeding post placenta. Pitocin infused rapidly IV per protocol. Methergin and TXA given. Fundus firm with massage.  Placenta inspected and appears to be intact with a 3 VC.  Placenta/Cord with the following complications: none .  Cord pH: not collected Sponge and instrument count were correct x2.  Intrapartum complications:  None Anesthesia:  epidural Episiotomy: none Lacerations:  none Suture Repair:  none EBL (mL): 274   Infant: APGAR (1 MIN): 8  APGAR (5 MINS): 9  APGAR (10 MINS):    Infant weight: pending  Mom to postpartum.  Baby to Couplet care / Skin to Skin. Placenta to L&D   Plans to Bottlefeed; formula Contraception: Depo-Provera injections Circumcision: N/A  Note sent to [facility]: [facility] for pp visit.  [place] Ar CNM, [facility] 12/15/2023 11:28 AM  I was present for the entire delivery of baby and placenta and inspection of placenta and agree with above.  Katrinka Blazing, [place] 12/15/2023 9:09 PM

## 2023-09-28 ENCOUNTER — Other Ambulatory Visit: Payer: Self-pay

## 2023-09-28 ENCOUNTER — Ambulatory Visit: Payer: 59 | Attending: Obstetrics and Gynecology

## 2023-09-28 ENCOUNTER — Other Ambulatory Visit: Payer: Self-pay | Admitting: *Deleted

## 2023-09-28 ENCOUNTER — Ambulatory Visit (HOSPITAL_BASED_OUTPATIENT_CLINIC_OR_DEPARTMENT_OTHER): Payer: 59 | Admitting: Maternal & Fetal Medicine

## 2023-09-28 DIAGNOSIS — O09899 Supervision of other high risk pregnancies, unspecified trimester: Secondary | ICD-10-CM | POA: Diagnosis not present

## 2023-09-28 DIAGNOSIS — O09212 Supervision of pregnancy with history of pre-term labor, second trimester: Secondary | ICD-10-CM

## 2023-09-28 DIAGNOSIS — Z362 Encounter for other antenatal screening follow-up: Secondary | ICD-10-CM

## 2023-09-28 DIAGNOSIS — O468X2 Other antepartum hemorrhage, second trimester: Secondary | ICD-10-CM | POA: Insufficient documentation

## 2023-09-28 DIAGNOSIS — O09292 Supervision of pregnancy with other poor reproductive or obstetric history, second trimester: Secondary | ICD-10-CM | POA: Diagnosis not present

## 2023-09-28 DIAGNOSIS — Z3A26 26 weeks gestation of pregnancy: Secondary | ICD-10-CM

## 2023-09-28 NOTE — Progress Notes (Signed)
 Patient information  Patient Name: Shelby Serrano  Patient MRN:   [id number]  Referring practice: MFM Referring Provider: [facility])  MFM CONSULT  Shelby Serrano is a 28 y.o. [phone] at [redacted]w[redacted]d here for ultrasound and consultation. Patient Active Problem List   Diagnosis Date Noted   Placenta previa in second trimester 07/20/2023   History of failed BTL with Filshie clips 07/07/2023   History of placental abruption 06/30/2023   History of preterm delivery, currently pregnant 06/22/2023   Supervision of high risk pregnancy, antepartum 06/14/2023   RE subchorionic hemorrhages: These appear to have resolved.  Discussed this increases the risk of placental abruption later in the pregnancy but overall the prognosis is good.  RE placenta previa: This is now resolved and the placenta is leading edges above the internal os and at least 2 cm away.  RE lower end of normal fetal growth: This is likely constitutional since the anatomic structures and the genetic screening was normal. The fetal growth percentiles are in the normal range but at the lower end of normal.  Will continue to assess fetal biometry in 4 weeks.  RE history of placental abruption: 1 prior placental abruption increases the risk of a subsequent 1 but there is no guarantee this will happen.  Given the patient also had subchorionic hemorrhages this risk is further increased.  I discussed bleeding precautions.  Sonographic findings Single intrauterine pregnancy at 26w 1d.  Fetal cardiac activity:  Observed and appears normal. Presentation: Cephalic. Interval fetal anatomy appears normal. Fetal biometry shows the estimated fetal weight at the 18 percentile. Amniotic fluid volume: Within normal limits. MVP: 5.38 cm. Placenta: Anterior. No evidence of subchorionic hemorrhage.   Recommendations -EDD is Estimated Date of Delivery: 01/03/24   -I discussed bleeding precautions due to the  history of abruption -Continue Aspirin  81 mg for preeclampsia prophylasis -Follow-up anatomy and fetal growth in 4 weeks to assess fetal biometry -Serial growth ultrasounds starting around 28 weeks to monitor for fetal growth restriction -Antenatal testing is not indicated at this time -Delivery timing pending clinical course but likely around 39 weeks  -Continue routine prenatal care with referring OB provider  Review of Systems: A review of systems was performed and was negative except per HPI   Vitals and Physical Exam    09/28/2023    8:42 AM 08/03/2023    1:24 PM 07/21/2023    2:26 PM  Vitals with BMI  Systolic 118 120 147  Diastolic 71 53 62  Pulse 80 68 75   Sitting comfortably on the sonogram table Nonlabored breathing Normal rate and rhythm Abdomen is nontender  Past pregnancies OB History  Gravida Para Term Preterm AB Living  9 5 4 1 3 4   SAB IAB Ectopic Multiple Live Births  3 0 0 0 5    # Outcome Date GA Lbr Len/2nd Weight Sex Type Anes PTL Lv  9 Current           8 Preterm 01/10/21 [redacted]w[redacted]d   M Vag-Spont   DEC  7 Term 02/18/20 [redacted]w[redacted]d 05:48 / 00:01 6 lb 5.6 oz (2.88 kg) F Vag-Spont None  LIV  6 Term 08/17/18 [redacted]w[redacted]d 08:00 / 00:08 6 lb 0.8 oz (2.745 kg) M Vag-Spont None  LIV  5 Term 07/15/16 [redacted]w[redacted]d 09:37 / 00:07 6 lb 0.1 oz (2.725 kg) F Vag-Spont EPI  LIV  4 SAB 07/2015 [redacted]w[redacted]d         3 SAB  04/2015          2 Term 04/05/12 [redacted]w[redacted]d  6 lb 2 oz (2.778 kg)  Vag-Spont   LIV     Birth Comments: no complications  1 SAB              I spent 20 minutes reviewing the patients chart, including labs and images as well as counseling the patient about her medical conditions. Greater than 50% of the time was spent in direct face-to-face patient counseling.  Penney Bowling  MFM, [facility]   09/28/2023  9:09 AM

## 2023-09-30 ENCOUNTER — Encounter: Payer: 59 | Admitting: Obstetrics & Gynecology

## 2023-10-18 ENCOUNTER — Inpatient Hospital Stay (HOSPITAL_COMMUNITY)
Admission: AD | Admit: 2023-10-18 | Discharge: 2023-10-21 | DRG: 832 | Disposition: A | Discharge status: Home / Self Care | Payer: 59 | Attending: Obstetrics and Gynecology | Admitting: Obstetrics and Gynecology

## 2023-10-18 ENCOUNTER — Inpatient Hospital Stay (HOSPITAL_COMMUNITY): Payer: 59

## 2023-10-18 ENCOUNTER — Other Ambulatory Visit: Payer: Self-pay

## 2023-10-18 ENCOUNTER — Encounter (HOSPITAL_COMMUNITY): Payer: Self-pay | Admitting: Obstetrics and Gynecology

## 2023-10-18 DIAGNOSIS — O2303 Infections of kidney in pregnancy, third trimester: Secondary | ICD-10-CM | POA: Diagnosis present

## 2023-10-18 DIAGNOSIS — Z3A29 29 weeks gestation of pregnancy: Secondary | ICD-10-CM | POA: Diagnosis not present

## 2023-10-18 DIAGNOSIS — R109 Unspecified abdominal pain: Secondary | ICD-10-CM

## 2023-10-18 DIAGNOSIS — O99013 Anemia complicating pregnancy, third trimester: Secondary | ICD-10-CM | POA: Diagnosis present

## 2023-10-18 DIAGNOSIS — Z87891 Personal history of nicotine dependence: Secondary | ICD-10-CM

## 2023-10-18 DIAGNOSIS — B360 Pityriasis versicolor: Secondary | ICD-10-CM | POA: Diagnosis present

## 2023-10-18 DIAGNOSIS — N1330 Unspecified hydronephrosis: Secondary | ICD-10-CM | POA: Diagnosis present

## 2023-10-18 DIAGNOSIS — D573 Sickle-cell trait: Secondary | ICD-10-CM | POA: Diagnosis present

## 2023-10-18 DIAGNOSIS — O99891 Other specified diseases and conditions complicating pregnancy: Secondary | ICD-10-CM | POA: Diagnosis present

## 2023-10-18 DIAGNOSIS — O0943 Supervision of pregnancy with grand multiparity, third trimester: Secondary | ICD-10-CM | POA: Diagnosis not present

## 2023-10-18 LAB — COMPREHENSIVE METABOLIC PANEL
ALT: 22 U/L (ref 0–44)
AST: 20 U/L (ref 15–41)
Albumin: 2.6 g/dL — ABNORMAL LOW (ref 3.5–5.0)
Alkaline Phosphatase: 151 U/L — ABNORMAL HIGH (ref 38–126)
Anion gap: 10 (ref 5–15)
BUN: <5 mg/dL — ABNORMAL LOW (ref 6–20)
CO2: 23 mmol/L (ref 22–32)
Calcium: 9 mg/dL (ref 8.9–10.3)
Chloride: 105 mmol/L (ref 98–111)
Creatinine, Ser: 0.71 mg/dL (ref 0.44–1.00)
GFR, Estimated: >60 mL/min (ref >60)
Glucose, Bld: 78 mg/dL (ref 70–99)
Potassium: 3.7 mmol/L (ref 3.5–5.1)
Sodium: 138 mmol/L (ref 135–145)
Total Bilirubin: 0.6 mg/dL (ref 0.0–1.2)
Total Protein: 6.2 g/dL — ABNORMAL LOW (ref 6.5–8.1)

## 2023-10-18 LAB — CBC WITH DIFFERENTIAL/PLATELET
Abs Immature Granulocytes: 0.17 10*3/uL — ABNORMAL HIGH (ref 0.00–0.07)
Basophils Absolute: 0 10*3/uL (ref 0.0–0.1)
Basophils Relative: 0 %
Eosinophils Absolute: 0.2 10*3/uL (ref 0.0–0.5)
Eosinophils Relative: 1 %
HCT: 33.7 % — ABNORMAL LOW (ref 36.0–46.0)
Hemoglobin: 11.5 g/dL — ABNORMAL LOW (ref 12.0–15.0)
Immature Granulocytes: 1 %
Lymphocytes Relative: 14 %
Lymphs Abs: 1.6 10*3/uL (ref 0.7–4.0)
MCH: 29.3 pg (ref 26.0–34.0)
MCHC: 34.1 g/dL (ref 30.0–36.0)
MCV: 86 fL (ref 80.0–100.0)
Monocytes Absolute: 0.7 10*3/uL (ref 0.1–1.0)
Monocytes Relative: 6 %
Neutro Abs: 9.4 10*3/uL — ABNORMAL HIGH (ref 1.7–7.7)
Neutrophils Relative %: 78 %
Platelets: 211 10*3/uL (ref 150–400)
RBC: 3.92 MIL/uL (ref 3.87–5.11)
RDW: 13.4 % (ref 11.5–15.5)
WBC: 12.1 10*3/uL — ABNORMAL HIGH (ref 4.0–10.5)
nRBC: 0 % (ref 0.0–0.2)

## 2023-10-18 LAB — URINALYSIS, ROUTINE W REFLEX MICROSCOPIC
Bilirubin Urine: NEGATIVE
Glucose, UA: NEGATIVE mg/dL
Hgb urine dipstick: NEGATIVE
Ketones, ur: NEGATIVE mg/dL
Nitrite: POSITIVE — AB
Protein, ur: 30 mg/dL — AB
Specific Gravity, Urine: 1.01 (ref 1.005–1.030)
Squamous Epithelial / HPF: >50 /[HPF] (ref 0–5)
pH: 6 (ref 5.0–8.0)

## 2023-10-18 LAB — TYPE AND SCREEN
ABO/RH(D): O POS
Antibody Screen: NEGATIVE

## 2023-10-18 MED ORDER — ZOLPIDEM TARTRATE 5 MG PO TABS: 5.0000 mg | ORAL_TABLET | Freq: Every evening | ORAL | Status: DC | PRN

## 2023-10-18 MED ORDER — LACTATED RINGERS IV SOLN: INTRAVENOUS | Status: DC

## 2023-10-18 MED ORDER — SODIUM CHLORIDE 0.9 % IV SOLN: INTRAVENOUS | Status: AC

## 2023-10-18 MED ORDER — PRENATAL MULTIVITAMIN CH
1.0000 | ORAL_TABLET | Freq: Every day | ORAL | Status: DC
  Administered 2023-10-19 – 2023-10-20 (×2): 1 via ORAL
  Filled 2023-10-18 (×2): qty 1

## 2023-10-18 MED ORDER — DOCUSATE SODIUM 100 MG PO CAPS
100.0000 mg | ORAL_CAPSULE | Freq: Every day | ORAL | Status: DC
  Administered 2023-10-19 – 2023-10-20 (×2): 100 mg via ORAL
  Filled 2023-10-18 (×2): qty 1

## 2023-10-18 MED ORDER — SODIUM CHLORIDE 0.9 % IV SOLN
2.0000 g | INTRAVENOUS | Status: DC
  Administered 2023-10-18 – 2023-10-20 (×3): 2 g via INTRAVENOUS
  Filled 2023-10-18 (×3): qty 20

## 2023-10-18 MED ORDER — PROMETHAZINE HCL 25 MG/ML IJ SOLN
25.0000 mg | Freq: Once | INTRAVENOUS | Status: AC
  Administered 2023-10-18: 25 mg via INTRAVENOUS
  Filled 2023-10-18: qty 1

## 2023-10-18 MED ORDER — CYCLOBENZAPRINE HCL 5 MG PO TABS
10.0000 mg | ORAL_TABLET | Freq: Once | ORAL | Status: AC
  Administered 2023-10-18: 10 mg via ORAL
  Filled 2023-10-18: qty 2

## 2023-10-18 MED ORDER — CALCIUM CARBONATE ANTACID 500 MG PO CHEW
2.0000 | CHEWABLE_TABLET | ORAL | Status: DC | PRN
  Administered 2023-10-20: 400 mg via ORAL
  Filled 2023-10-18: qty 2

## 2023-10-18 MED ORDER — OXYCODONE-ACETAMINOPHEN 5-325 MG PO TABS
1.0000 | ORAL_TABLET | Freq: Four times a day (QID) | ORAL | Status: DC | PRN
  Administered 2023-10-18 – 2023-10-19 (×2): 1 via ORAL
  Administered 2023-10-20: 2 via ORAL
  Administered 2023-10-20 (×2): 1 via ORAL
  Filled 2023-10-18: qty 1
  Filled 2023-10-18: qty 2
  Filled 2023-10-18: qty 1
  Filled 2023-10-18: qty 2
  Filled 2023-10-18: qty 1

## 2023-10-18 MED ORDER — ACETAMINOPHEN 325 MG PO TABS
650.0000 mg | ORAL_TABLET | ORAL | Status: DC | PRN
  Administered 2023-10-18 – 2023-10-20 (×5): 650 mg via ORAL
  Filled 2023-10-18 (×5): qty 2

## 2023-10-18 NOTE — H&P (Signed)
 MAU Provider Note  Chief Complaint: Abdominal Pain  SUBJECTIVE HPI: Shelby Serrano is a 28 y.o. [id number] at [redacted]w[redacted]d by early ultrasound who presents to maternity admissions reporting RLQ pain. Pregnancy uncomplicated. She reports she has had pain starting at 9PM last night. She reports that she was walking back to her bedroom when pain started. She reports that pain has been constant and worsening. Pain is burning and stabbing. She reports that pain has been spreading since onset, radiating down to upper thigh and up the side of her R abdomen. Reports nausea, no vomiting. Has not had a BM since pain started. No VB. Reports some DFM overnight but has been feeling baby move since being here. Reports urine has been unchanged, but reports she has been urinating less. Reports she had dinner last night, then had a fruit cup this morning. She has complains of nausea, but no vomiting. Has not been drinking water much because of pain and discomfort.   Denies fever, cough, chest pain, SOB, no sick contacts.   Only abdominal surgery she has had was a BTL in 2021. Has been pregnant twice since then including this pregnancy, plus one ended in SAB.  Receives [facility].  Past Medical History:  Diagnosis Date   Acute chorioamnionitis 01/13/2021   Noted on placental pathology        Failed BTL ([place]) 09/28/2020   02/2020     Grand multiparity 10/08/2020   Left sided abdominal pain 01/12/2021   Low serum vitamin D  10/09/2020   [ ]  recheck at 28wks     Preterm delivery 01/10/2021   Preterm delivery at [redacted]w[redacted]d     Sickle cell trait [facility])    Supervision of high risk pregnancy, antepartum 01/10/2021   Supervision of other normal pregnancy, antepartum 03/10/2016              Nursing Staff    Provider      Office Location    [facility]    Dating      6wk u/s      Language     English    Anatomy US             Flu Vaccine          Genetic Screen     NIPS:  Low risk           TDaP Vaccine          Hgb  A1C or   GTT    Early   Third trimester       COVID Vaccine              LAB RESULTS       Rhogam     NA    Blood Type    --/--/O POS, O POS  Performed at [facility]   Vaginal bleeding affecting early pregnancy 10/08/2020   Past Surgical History:  Procedure Laterality Date   TUBAL LIGATION Bilateral 02/18/2020   Procedure: POST PARTUM TUBAL LIGATION;  Surgeon: Izell Harari, MD;  Location: [facility] LD ORS;  Service: Gynecology;  Laterality: Bilateral;   Social History   Socioeconomic History   Marital status: Single    Spouse name: Not on file   Number of children: Not on file   Years of education: Not on file   Highest education level: Not on file  Occupational History   Not on file  Tobacco Use   Smoking status: Former    Current packs/day:  0.00    Types: Cigarettes    Quit date: 02/08/2016    Years since quitting: 7.6   Smokeless tobacco: Never  Vaping Use   Vaping status: Never Used  Substance and Sexual Activity   Alcohol use: Not Currently   Drug use: Not Currently    Types: Marijuana    Comment: Patient denies, Positive UDS 05/04/16 [facility] ED   Sexual activity: Yes  Other Topics Concern   Not on file  Social History Narrative   Not on file   Social Drivers of Health   Financial Resource Strain: Not on file  Food Insecurity: No Food Insecurity (11/14/2020)   Received from [facility], [facility]   Hunger Vital Sign    Worried About Running Out of Food in the Last Year: Never true    Ran Out of Food in the Last Year: Never true  Transportation Needs: No Transportation Needs (02/04/2020)   PRAPARE - Administrator, Civil Service (Medical): No    Lack of Transportation (Non-Medical): No  Physical Activity: Not on file  Stress: Not on file  Social Connections: Unknown (01/26/2022)   Received from [facility], [facility]   Social Network    Social Network: Not on file  Intimate Partner Violence: Unknown (12/18/2021)   Received from [facility],  [facility]   HITS    Physically Hurt: Not on file    Insult or Talk Down To: Not on file    Threaten Physical Harm: Not on file    Scream or Curse: Not on file   No current facility-administered medications on file prior to encounter.   Current Outpatient Medications on File Prior to Encounter  Medication Sig Dispense Refill   Prenatal Vit-Fe Fumarate-FA (PREPLUS) 27-1 MG TABS Take 1 tablet by mouth daily. 30 tablet 11   aspirin  EC 81 MG tablet Take 1 tablet (81 mg total) by mouth daily. Swallow whole. (Patient not taking: Reported on 10/18/2023) 30 tablet 12   Blood Pressure Monitoring (BLOOD PRESSURE KIT) DEVI 1 each by Does not apply route as needed. 1 each 0   naproxen  (NAPROSYN ) 500 MG tablet Take 1 tablet (500 mg total) by mouth 2 (two) times daily as needed (pain). (Patient not taking: Reported on 10/18/2023) 30 tablet 0   No Known Allergies  ROS:  Pertinent positives/negatives listed above.  I have reviewed patient's Past Medical Hx, Surgical Hx, Family Hx, Social Hx, medications and allergies.   Physical Exam  Patient Vitals for the past 24 hrs:  BP Temp Pulse Resp  10/18/23 1315 99/62 (!) 97.5 F (36.4 C) 72 18   Constitutional: Well-developed, well-nourished female, unable to lay still, looks uncomfortable on exam Cardiovascular: normal rate Respiratory: normal effort GI: Abd soft, tender to palpation on R. CVA tenderness on the R MS: Extremities nontender, no edema, normal ROM Neurologic: Alert and oriented x 4  GU: Pos RT CVAT.  FHT:  Baseline 145 , moderate variability, no accelerations, no decelerations Contractions: none  LAB RESULTS Results for orders placed or performed during the hospital encounter of 10/18/23 (from the past 24 hours)  Urinalysis, Routine w reflex microscopic -Urine, Clean Catch     Status: Abnormal   Collection Time: 10/18/23  1:26 PM  Result Value Ref Range   Color, Urine YELLOW YELLOW   APPearance CLOUDY (A) CLEAR   Specific  Gravity, Urine 1.010 1.005 - 1.030   pH 6.0 5.0 - 8.0   Glucose, UA NEGATIVE NEGATIVE mg/dL  Hgb urine dipstick NEGATIVE NEGATIVE   Bilirubin Urine NEGATIVE NEGATIVE   Ketones, ur NEGATIVE NEGATIVE mg/dL   Protein, ur 30 (A) NEGATIVE mg/dL   Nitrite POSITIVE (A) NEGATIVE   Leukocytes,Ua LARGE (A) NEGATIVE   RBC / HPF 6-10 0 - 5 RBC/hpf   WBC, UA 21-50 0 - 5 WBC/hpf   Bacteria, UA MANY (A) NONE SEEN   Squamous Epithelial / HPF >50 0 - 5 /HPF  CBC with Differential/Platelet     Status: Abnormal   Collection Time: 10/18/23  2:39 PM  Result Value Ref Range   WBC 12.1 (H) 4.0 - 10.5 K/uL   RBC 3.92 3.87 - 5.11 MIL/uL   Hemoglobin 11.5 (L) 12.0 - 15.0 g/dL   HCT 66.2 (L) 63.9 - 53.9 %   MCV 86.0 80.0 - 100.0 fL   MCH 29.3 26.0 - 34.0 pg   MCHC 34.1 30.0 - 36.0 g/dL   RDW 86.5 88.4 - 84.4 %   Platelets 211 150 - 400 K/uL   nRBC 0.0 0.0 - 0.2 %   Neutrophils Relative % 78 %   Neutro Abs 9.4 (H) 1.7 - 7.7 K/uL   Lymphocytes Relative 14 %   Lymphs Abs 1.6 0.7 - 4.0 K/uL   Monocytes Relative 6 %   Monocytes Absolute 0.7 0.1 - 1.0 K/uL   Eosinophils Relative 1 %   Eosinophils Absolute 0.2 0.0 - 0.5 K/uL   Basophils Relative 0 %   Basophils Absolute 0.0 0.0 - 0.1 K/uL   Immature Granulocytes 1 %   Abs Immature Granulocytes 0.17 (H) 0.00 - 0.07 K/uL  Comprehensive metabolic panel     Status: Abnormal   Collection Time: 10/18/23  2:39 PM  Result Value Ref Range   Sodium 138 135 - 145 mmol/L   Potassium 3.7 3.5 - 5.1 mmol/L   Chloride 105 98 - 111 mmol/L   CO2 23 22 - 32 mmol/L   Glucose, Bld 78 70 - 99 mg/dL   BUN <5 (L) 6 - 20 mg/dL   Creatinine, Ser 9.28 0.44 - 1.00 mg/dL   Calcium  9.0 8.9 - 10.3 mg/dL   Total Protein 6.2 (L) 6.5 - 8.1 g/dL   Albumin 2.6 (L) 3.5 - 5.0 g/dL   AST 20 15 - 41 U/L   ALT 22 0 - 44 U/L   Alkaline Phosphatase 151 (H) 38 - 126 U/L   Total Bilirubin 0.6 0.0 - 1.2 mg/dL   GFR, Estimated >39 >39 mL/min   Anion gap 10 5 - 15    O/Positive/-- (10/09  1024)  IMAGING US  RENAL Result Date: 10/18/2023 CLINICAL DATA:  Right flank pain, [redacted] weeks pregnant EXAM: RENAL / URINARY TRACT ULTRASOUND COMPLETE COMPARISON:  None Available. FINDINGS: Right Kidney: Renal measurements: 11.4 x 6.2 x 5.7 cm = volume: 212 mL. Moderate-severe hydronephrosis. Normal echogenicity. Left Kidney: Renal measurements: 9.7 x 5.2 x 4.7 cm = volume: 125 mL. Echogenicity within normal limits. No mass or hydronephrosis visualized. Bladder: The ureteral jets were seen bilaterally. Other: None. IMPRESSION: Moderate-severe right hydronephrosis. A right ureteral jet was seen within the bladder. Electronically Signed   By: Norman Gatlin M.D.   On: 10/18/2023 17:06    MAU Management/MDM: Orders Placed This Encounter  Procedures   US  RENAL   Urinalysis, Routine w reflex microscopic -Urine, Clean Catch   CBC with Differential/Platelet   Comprehensive metabolic panel    Meds ordered this encounter  Medications   promethazine  (PHENERGAN ) 25 mg in lactated ringers   1,000 mL infusion   cyclobenzaprine  (FLEXERIL ) tablet 10 mg   lactated ringers  infusion    *Consult with Dr. Alger @ 1750 - notified of patient's complaints, assessments, lab & U/S results, tx plan admission to [facility] for IV abx tx - agrees with plan and will enter orders in Epic  Available prenatal records reviewed.  ASSESSMENT & PLAN 1. Pyelonephritis affecting pregnancy in third trimester  2. Right flank pain 3. [redacted] weeks gestation of pregnancy   With right sided pain, CVA tenderness on the R, and UA with leuk esterase, bacteria, and leukocytes, concern for pyelonephritis. Renal US  with moderate-severe hydronephrosis on the R CBC with white count to 12.1  - Admit to OBSCU - Dr. Alger will enter admission orders - Patient notified. Patient verbalized an understanding of the plan of care and agrees.    Isaiah JONETTA Bridge, MD 10/18/2023  5:55 PM   Attestation of Supervision of Student:  I confirm that I  have verified the information documented in the  resident 's note and that I have also personally reperformed the history, physical exam and all medical decision making activities.  I have verified that all services and findings are accurately documented in this student's note; and I agree with management and plan as outlined in the documentation. I have also made any necessary editorial changes.  Modifications to H&P in above note and not here.  Sibel Khurana, CNM [facility] [employer] 10/18/2023 6:05 PM

## 2023-10-18 NOTE — MAU Note (Signed)
.  Shelby Serrano is a 28 y.o. at [redacted]w[redacted]d here in MAU reporting: sharp pin in her right side and abd goes from under her diaphragm all the way to her right lower quadrant and into her thigh pain started last night and has not eased up . Has not tried any pain meds. Denies andy vag bleeding has has some clear discharge. Stated she has not  felt fetus move since lst night but is now feeling her since she has been on the monitor.   LMP:  Onset of complaint: 9pm Pain score: 10 Vitals:   10/18/23 1315  BP: 99/62  Pulse: 72  Resp: 18  Temp: (!) 97.5 F (36.4 C)     FHT: 140  Lab orders placed from triage: u/a

## 2023-10-18 NOTE — MAU Provider Note (Deleted)
 MAU Provider Note  Chief Complaint: Abdominal Pain  SUBJECTIVE HPI: Shelby Serrano is a 28 y.o. [id number] at [redacted]w[redacted]d by early ultrasound who presents to maternity admissions reporting RLQ pain. Pregnancy uncomplicated. She reports she has had pain starting at 9PM last night. She reports that she was walking back to her bedroom when pain started. She reports that pain has been constant and worsening. Pain is burning and stabbing. She reports that pain has been spreading since onset, radiating down to upper thigh and up the side of her R abdomen. Reports nausea, no vomiting. Has not had a BM since pain started. No VB. Reports some DFM overnight but has been feeling baby move since being here. Reports urine has been unchanged, but reports she has been urinating less. Reports she had dinner last night, then had a fruit cup this morning. Has not been drinking water much because of pain and discomfort.   Denies fever, cough, chest pain, SOB, no sick contacts.   Only abdominal surgery she has had was a BTL in 2021. Has been pregnant twice since then including this pregnancy, plus one ended in SAB.  Receives [facility].  Past Medical History:  Diagnosis Date   Acute chorioamnionitis 01/13/2021   Noted on placental pathology        Failed BTL (Filshie) 09/28/2020   02/2020     Grand multiparity 10/08/2020   Left sided abdominal pain 01/12/2021   Low serum vitamin D  10/09/2020   [ ]  recheck at 28wks     Preterm delivery 01/10/2021   Preterm delivery at [redacted]w[redacted]d     Sickle cell trait [facility])    Supervision of high risk pregnancy, antepartum 01/10/2021   Supervision of other normal pregnancy, antepartum 03/10/2016              Nursing Staff    Provider      Office Location    [facility]    Dating      6wk u/s      Language     English    Anatomy US             Flu Vaccine          Genetic Screen     NIPS:  Low risk           TDaP Vaccine          Hgb A1C or   GTT    Early   Third trimester        COVID Vaccine              LAB RESULTS       Rhogam     NA    Blood Type    --/--/O POS, O POS  Performed at [facility]   Vaginal bleeding affecting early pregnancy 10/08/2020   Past Surgical History:  Procedure Laterality Date   TUBAL LIGATION Bilateral 02/18/2020   Procedure: POST PARTUM TUBAL LIGATION;  Surgeon: Izell Harari, MD;  Location: [facility] LD ORS;  Service: Gynecology;  Laterality: Bilateral;   Social History   Socioeconomic History   Marital status: Single    Spouse name: Not on file   Number of children: Not on file   Years of education: Not on file   Highest education level: Not on file  Occupational History   Not on file  Tobacco Use   Smoking status: Former    Current packs/day: 0.00    Types: Cigarettes  Quit date: 02/08/2016    Years since quitting: 7.6   Smokeless tobacco: Never  Vaping Use   Vaping status: Never Used  Substance and Sexual Activity   Alcohol use: Not Currently   Drug use: Not Currently    Types: Marijuana    Comment: Patient denies, Positive UDS 05/04/16 [facility] ED   Sexual activity: Yes  Other Topics Concern   Not on file  Social History Narrative   Not on file   Social Drivers of Health   Financial Resource Strain: Not on file  Food Insecurity: No Food Insecurity (11/14/2020)   Received from [facility]   Hunger Vital Sign    Worried About Running Out of Food in the Last Year: Never true    Ran Out of Food in the Last Year: Never true  Transportation Needs: No Transportation Needs (02/04/2020)   PRAPARE - Administrator, Civil Service (Medical): No    Lack of Transportation (Non-Medical): No  Physical Activity: Not on file  Stress: Not on file  Social Connections: Unknown (01/26/2022)   Received [facility]   Social Network    Social Network: Not on file  Intimate Partner Violence: Unknown (12/18/2021)   Received from [facility], [facility]   HITS    Physically Hurt: Not on  file    Insult or Talk Down To: Not on file    Threaten Physical Harm: Not on file    Scream or Curse: Not on file   No current facility-administered medications on file prior to encounter.   Current Outpatient Medications on File Prior to Encounter  Medication Sig Dispense Refill   Prenatal Vit-Fe Fumarate-FA (PREPLUS) 27-1 MG TABS Take 1 tablet by mouth daily. 30 tablet 11   aspirin  EC 81 MG tablet Take 1 tablet (81 mg total) by mouth daily. Swallow whole. (Patient not taking: Reported on 10/18/2023) 30 tablet 12   Blood Pressure Monitoring (BLOOD PRESSURE KIT) DEVI 1 each by Does not apply route as needed. 1 each 0   naproxen  (NAPROSYN ) 500 MG tablet Take 1 tablet (500 mg total) by mouth 2 (two) times daily as needed (pain). (Patient not taking: Reported on 10/18/2023) 30 tablet 0   No Known Allergies  ROS:  Pertinent positives/negatives listed above.  I have reviewed patient's Past Medical Hx, Surgical Hx, Family Hx, Social Hx, medications and allergies.   Physical Exam  Patient Vitals for the past 24 hrs:  BP Temp Pulse Resp  10/18/23 1315 99/62 (!) 97.5 F (36.4 C) 72 18   Constitutional: Well-developed, well-nourished female, unable to lay still, looks uncomfortable on exam Cardiovascular: normal rate Respiratory: normal effort GI: Abd soft, tender to palpation on R. CVA tenderness on the R MS: Extremities nontender, no edema, normal ROM Neurologic: Alert and oriented x 4  GU: Neg CVAT.  FHT:  Baseline 145 , moderate variability, no accelerations, no decelerations Contractions: none  LAB RESULTS Results for orders placed or performed during the hospital encounter of 10/18/23 (from the past 24 hours)  Urinalysis, Routine w reflex microscopic -Urine, Clean Catch     Status: Abnormal   Collection Time: 10/18/23  1:26 PM  Result Value Ref Range   Color, Urine YELLOW YELLOW   APPearance CLOUDY (A) CLEAR   Specific Gravity, Urine 1.010 1.005 - 1.030   pH 6.0 5.0 - 8.0    Glucose, UA NEGATIVE NEGATIVE mg/dL   Hgb urine dipstick NEGATIVE NEGATIVE   Bilirubin Urine  NEGATIVE NEGATIVE   Ketones, ur NEGATIVE NEGATIVE mg/dL   Protein, ur 30 (A) NEGATIVE mg/dL   Nitrite POSITIVE (A) NEGATIVE   Leukocytes,Ua LARGE (A) NEGATIVE   RBC / HPF 6-10 0 - 5 RBC/hpf   WBC, UA 21-50 0 - 5 WBC/hpf   Bacteria, UA MANY (A) NONE SEEN   Squamous Epithelial / HPF >50 0 - 5 /HPF  CBC with Differential/Platelet     Status: Abnormal   Collection Time: 10/18/23  2:39 PM  Result Value Ref Range   WBC 12.1 (H) 4.0 - 10.5 K/uL   RBC 3.92 3.87 - 5.11 MIL/uL   Hemoglobin 11.5 (L) 12.0 - 15.0 g/dL   HCT 66.2 (L) 63.9 - 53.9 %   MCV 86.0 80.0 - 100.0 fL   MCH 29.3 26.0 - 34.0 pg   MCHC 34.1 30.0 - 36.0 g/dL   RDW 86.5 88.4 - 84.4 %   Platelets 211 150 - 400 K/uL   nRBC 0.0 0.0 - 0.2 %   Neutrophils Relative % 78 %   Neutro Abs 9.4 (H) 1.7 - 7.7 K/uL   Lymphocytes Relative 14 %   Lymphs Abs 1.6 0.7 - 4.0 K/uL   Monocytes Relative 6 %   Monocytes Absolute 0.7 0.1 - 1.0 K/uL   Eosinophils Relative 1 %   Eosinophils Absolute 0.2 0.0 - 0.5 K/uL   Basophils Relative 0 %   Basophils Absolute 0.0 0.0 - 0.1 K/uL   Immature Granulocytes 1 %   Abs Immature Granulocytes 0.17 (H) 0.00 - 0.07 K/uL  Comprehensive metabolic panel     Status: Abnormal   Collection Time: 10/18/23  2:39 PM  Result Value Ref Range   Sodium 138 135 - 145 mmol/L   Potassium 3.7 3.5 - 5.1 mmol/L   Chloride 105 98 - 111 mmol/L   CO2 23 22 - 32 mmol/L   Glucose, Bld 78 70 - 99 mg/dL   BUN <5 (L) 6 - 20 mg/dL   Creatinine, Ser 9.28 0.44 - 1.00 mg/dL   Calcium  9.0 8.9 - 10.3 mg/dL   Total Protein 6.2 (L) 6.5 - 8.1 g/dL   Albumin 2.6 (L) 3.5 - 5.0 g/dL   AST 20 15 - 41 U/L   ALT 22 0 - 44 U/L   Alkaline Phosphatase 151 (H) 38 - 126 U/L   Total Bilirubin 0.6 0.0 - 1.2 mg/dL   GFR, Estimated >39 >39 mL/min   Anion gap 10 5 - 15    O/Positive/-- (10/09 1024)  IMAGING US  RENAL Result Date:  10/18/2023 CLINICAL DATA:  Right flank pain, [redacted] weeks pregnant EXAM: RENAL / URINARY TRACT ULTRASOUND COMPLETE COMPARISON:  None Available. FINDINGS: Right Kidney: Renal measurements: 11.4 x 6.2 x 5.7 cm = volume: 212 mL. Moderate-severe hydronephrosis. Normal echogenicity. Left Kidney: Renal measurements: 9.7 x 5.2 x 4.7 cm = volume: 125 mL. Echogenicity within normal limits. No mass or hydronephrosis visualized. Bladder: The ureteral jets were seen bilaterally. Other: None. IMPRESSION: Moderate-severe right hydronephrosis. A right ureteral jet was seen within the bladder. Electronically Signed   By: Norman Gatlin M.D.   On: 10/18/2023 17:06   US  MFM OB FOLLOW UP Result Date: 09/28/2023 ----------------------------------------------------------------------  OBSTETRICS REPORT                       (Signed Final 09/28/2023 09:21 am) ---------------------------------------------------------------------- Patient Info  ID #:       [id number]  D.O.B.:  1996/03/06 (27 yrs)(F)  Name:       Shelby Serrano            Visit Date: 09/28/2023 09:00 am ---------------------------------------------------------------------- Performed By  Attending:        Delora Smaller DO       Ref. Address:     [address]                                                             [place], [place]                                                             [address]  Performed By:     Joyann Allen RDMS      [facility]  Referred By:      [facility] ---------------------------------------------------------------------- Orders  #  Description                           Code        Ordered By  1  US  MFM OB FOLLOW UP                   [id number]    FREDIA FRESH ----------------------------------------------------------------------  #  Order #                     Accession #                Episode #  1  [id number]                   [id number]                 [id number] ---------------------------------------------------------------------- Indications  [redacted] weeks gestation of pregnancy  Z3A.26 ---------------------------------------------------------------------- Vital Signs  BP:          118/71 ---------------------------------------------------------------------- Fetal Evaluation  Num Of Fetuses:         1  Fetal Heart Rate(bpm):  137  Cardiac Activity:       Observed  Presentation:           Cephalic  Placenta:               Anterior  P. Cord Insertion:      Visualized  Amniotic Fluid  AFI FV:      Within normal limits                              Largest Pocket(cm)                              5.38 ---------------------------------------------------------------------- Biometry  BPD:      63.7  mm     G. Age:  25w 5d         28  %    CI:        70.98   %    70 - 86                                                          FL/HC:      19.3   %    18.6 - 20.4  HC:      240.9  mm     G. Age:  26w 1d         25  %    HC/AC:      1.15        1.04 - 1.22  AC:      209.2  mm     G. Age:  25w 3d         22  %    FL/BPD:     73.2   %    71 - 87  FL:       46.6  mm     G. Age:  25w 4d         18  %    FL/AC:      22.3   %    20 - 24  LV:        2.9  mm  Est. FW:     825  gm    1 lb 13 oz      18  % ---------------------------------------------------------------------- OB History  Blood Type:   O+  Gravidity:    9         Term:   4        Prem:   1        SAB:   3  TOP:          0       Ectopic:  0        Living: 4 ---------------------------------------------------------------------- Gestational Age  U/S Today:     25w 5d  EDD:   01/06/24  Best:          26w 1d     Det. By:  U/S C R L  (05/25/23)    EDD:   01/03/24  ---------------------------------------------------------------------- Anatomy  Cranium:               Previously seen        Aortic Arch:            Previously seen  Cavum:                 Previously seen        Ductal Arch:            Previously seen  Ventricles:            Appears normal         Diaphragm:              Appears normal  Choroid Plexus:        Previously seen        Stomach:                Appears normal, left                                                                        sided  Cerebellum:            Previously seen        Abdomen:                Previously seen  Posterior Fossa:       Previously seen        Abdominal Wall:         Previously seen  Face:                  Orbits and profile     Cord Vessels:           Previously seen                         previously seen  Lips:                  Previously seen        Kidneys:                Appear normal  Thoracic:              Previously seen        Bladder:                Appears normal  Heart:                 Appears normal         Spine:                  Previously seen                         (4CH, axis, and                         situs)  RVOT:  Previously seen        Upper Extremities:      Previously seen  LVOT:                  Previously seen        Lower Extremities:      Previously seen ---------------------------------------------------------------------- Cervix Uterus Adnexa  Cervix  Not visualized (advanced GA >24wks)  Uterus  No abnormality visualized.  Right Ovary  Not visualized.  Left Ovary  Not visualized.  Cul De Sac  No free fluid seen.  Adnexa  No abnormality visualized ---------------------------------------------------------------------- Comments  MFM CONSULT  Shelby Serrano is a 28 y.o. H0E5865 at [redacted]w[redacted]d  here for ultrasound and consultation.  Patient Active Problem List           Diagnosis         Date Noted          Placenta previa in second trimester (RESOLVED)  (RESOLVED)        07/20/2023           History of failed BTL with Filshie clips  clips    07/07/2023          History of placental abruption      06/30/2023          History of preterm delivery, currently pregnant  pregnant 06/22/2023          Supervision of high risk pregnancy, antepartum  antepartum        06/14/2023  RE subchorionic hemorrhages: These appear to have  resolved.  Discussed this increases the risk of placental  abruption later in the pregnancy but overall the prognosis is  good.  RE placenta previa: This is now resolved and the placenta is  leading edges above the internal os and at least 2 cm away.  RE lower end of normal fetal growth: This is likely  constitutional since the anatomic structures and the genetic  screening was normal. The fetal growth percentiles are in the  normal range but at the lower end of normal.  Will continue to  assess fetal biometry in 4 weeks.  RE history of placental abruption: 1 prior placental abruption  increases the risk of a subsequent 1 but there is no  guarantee this will happen.  Given the patient also had  subchorionic hemorrhages this risk is further increased.  I  discussed bleeding precautions.  Sonographic findings  Single intrauterine pregnancy at 26w 1d.  Fetal cardiac activity:  Observed and appears normal.  Presentation: Cephalic.  Interval fetal anatomy appears normal.  Fetal biometry shows the estimated fetal weight at the 18  percentile.  Amniotic fluid volume: Within normal limits. MVP: 5.38 cm.  Placenta: Anterior. No evidence of subchorionic hemorrhage.  Recommendations  -EDD is Estimated Date of Delivery: 01/03/24  -I discussed bleeding precautions due to the history of  abruption  -Continue Aspirin  81 mg for preeclampsia prophylasis  -Follow-up anatomy and fetal growth in 4 weeks to assess  fetal biometry  -Serial growth ultrasounds starting around 28 weeks to  monitor for fetal growth restriction  -Antenatal testing is not indicated at this time  -Delivery timing pending  clinical course but likely around 39  weeks  -Continue routine prenatal care with referring OB provider ----------------------------------------------------------------------                  Shelby Smaller, DO Electronically Signed Final Report   09/28/2023 09:21 am ----------------------------------------------------------------------    MAU Management/MDM: Orders Placed  This Encounter  Procedures   US  RENAL   Urinalysis, Routine w reflex microscopic -Urine, Clean Catch   CBC with Differential/Platelet   Comprehensive metabolic panel    Meds ordered this encounter  Medications   promethazine  (PHENERGAN ) 25 mg in lactated ringers  1,000 mL infusion   cyclobenzaprine  (FLEXERIL ) tablet 10 mg   lactated ringers  infusion     Available prenatal records reviewed.  ASSESSMENT 1. Supervision of high risk pregnancy, antepartum    With right sided pain, CVA tenderness on the R, and UA with leuk esterase, bacteria, and leukocytes, concern for pyelonephritis. Renal US  with moderate-severe hydronephrosis on the R CBC with white count to 12.1  PLAN Admit    Shelby JONETTA Bridge, MD 10/18/2023  5:55 PM   Attestation of Supervision of Student:  I confirm that I have verified the information documented in the  resident 's note and that I have also personally reperformed the history, physical exam and all medical decision making activities.  I have verified that all services and findings are accurately documented in this student's note; and I agree with management and plan as outlined in the documentation. I have also made any necessary editorial changes.   Hisae Decoursey, CNM [facility] [facility], [facility] 10/19/2023 11:09 AM

## 2023-10-19 DIAGNOSIS — Z3A29 29 weeks gestation of pregnancy: Secondary | ICD-10-CM

## 2023-10-19 DIAGNOSIS — O2303 Infections of kidney in pregnancy, third trimester: Principal | ICD-10-CM

## 2023-10-19 LAB — IRON AND TIBC
Iron: 85 ug/dL (ref 28–170)
Saturation Ratios: 15 % (ref 10.4–31.8)
TIBC: 561 ug/dL — ABNORMAL HIGH (ref 250–450)
UIBC: 476 ug/dL

## 2023-10-19 LAB — FOLATE: Folate: 9.8 ng/mL (ref >5.9)

## 2023-10-19 LAB — CBC
HCT: 27.4 % — ABNORMAL LOW (ref 36.0–46.0)
Hemoglobin: 9.5 g/dL — ABNORMAL LOW (ref 12.0–15.0)
MCH: 29.4 pg (ref 26.0–34.0)
MCHC: 34.7 g/dL (ref 30.0–36.0)
MCV: 84.8 fL (ref 80.0–100.0)
Platelets: 174 10*3/uL (ref 150–400)
RBC: 3.23 MIL/uL — ABNORMAL LOW (ref 3.87–5.11)
RDW: 13.5 % (ref 11.5–15.5)
WBC: 10.6 10*3/uL — ABNORMAL HIGH (ref 4.0–10.5)
nRBC: 0 % (ref 0.0–0.2)

## 2023-10-19 LAB — VITAMIN B12: Vitamin B-12: 187 pg/mL (ref 180–914)

## 2023-10-19 LAB — FERRITIN: Ferritin: 12 ng/mL (ref 11–307)

## 2023-10-19 MED ORDER — ENOXAPARIN SODIUM 40 MG/0.4ML IJ SOSY
40.0000 mg | PREFILLED_SYRINGE | INTRAMUSCULAR | Status: DC
  Administered 2023-10-19 – 2023-10-20 (×2): 40 mg via SUBCUTANEOUS
  Filled 2023-10-19 (×2): qty 0.4

## 2023-10-19 NOTE — Progress Notes (Signed)
 Daily Antepartum Note  Admission Date: 10/18/2023 Current Date: 10/19/2023 1:08 PM  Shelby Serrano is a 28 y.o. H0E5865 at [redacted]w[redacted]d, admitted for right sided pyelo.  Pregnancy complicated by: Patient Active Problem List   Diagnosis Date Noted   Pyelonephritis affecting pregnancy in third trimester 10/18/2023   Subchorionic hemorrhage in second trimester (RESOLVE) 09/28/2023   Placenta previa in second trimester (RESOLVED) 07/20/2023   History of failed BTL with Filshie clips 07/07/2023   History of placental abruption 06/30/2023   History of preterm delivery, currently pregnant 06/22/2023   Supervision of high risk pregnancy, antepartum 06/14/2023    Overnight/24hr events:  Patient admitted  Subjective:  No preterm labor signs. Feeling a little bit better. Still with right sided back soreness  Objective:    Current Vital Signs 24h Vital Sign Ranges  T (!) 97.5 F (36.4 C) Temp  Avg: 98.1 F (36.7 C)  Min: 97.5 F (36.4 C)  Max: 98.7 F (37.1 C)  BP (!) 86/50 BP  Min: 76/36  Max: 99/62  HR 70 Pulse  Avg: 70.7  Min: 63  Max: 79  RR 16 Resp  Avg: 16.9  Min: 16  Max: 18  SaO2 100 % Room Air SpO2  Avg: 99.2 %  Min: 98 %  Max: 100 %       24 Hour I/O Current Shift I/O  Time Ins Outs 02/04 0701 - 02/05 0700 In: 755.4 [I.V.:655.4] Out: -  02/05 0701 - 02/05 1900 In: 582.5 [I.V.:582.5] Out: -    Fetal Heart Tones: 135 baseline, +accels, no decel, mod variability Tocometry: quiet  Physical exam: General: Well nourished, well developed female in no acute distress. Abdomen: gravid ntt Back: minimally ttp on the right but no CVAT  Cardiovascular: S1, S2 normal, no murmur, rub or gallop, regular rate and rhythm Respiratory: CTAB Extremities: no clubbing, cyanosis or edema Skin: Warm and dry.   Medications: Current Facility-Administered Medications  Medication Dose Route Frequency Provider Last Rate Last Admin   0.9 %  sodium chloride  infusion   Intravenous Continuous  Constant, Peggy, MD 100 mL/hr at 10/19/23 0610 New Bag at 10/19/23 0610   acetaminophen  (TYLENOL ) tablet 650 mg  650 mg Oral Q4H PRN Constant, Peggy, MD   650 mg at 10/19/23 0911   calcium  carbonate (TUMS - dosed in mg elemental calcium ) chewable tablet 400 mg of elemental calcium   2 tablet Oral Q4H PRN Constant, Peggy, MD       cefTRIAXone  (ROCEPHIN ) 2 g in sodium chloride  0.9 % 100 mL IVPB  2 g Intravenous Q24H Constant, Peggy, MD 200 mL/hr at 10/18/23 1838 2 g at 10/18/23 1838   docusate sodium  (COLACE) capsule 100 mg  100 mg Oral Daily Constant, Peggy, MD   100 mg at 10/19/23 0911   oxyCODONE -acetaminophen  (PERCOCET/ROXICET) 5-325 MG per tablet 1-2 tablet  1-2 tablet Oral Q6H PRN Constant, Peggy, MD   1 tablet at 10/18/23 2312   prenatal multivitamin tablet 1 tablet  1 tablet Oral Q1200 Constant, Peggy, MD   1 tablet at 10/19/23 1136   zolpidem  (AMBIEN ) tablet 5 mg  5 mg Oral QHS PRN Constant, Peggy, MD        Labs:  Recent Labs  Lab 10/18/23 1439 10/19/23 0527  WBC 12.1* 10.6*  HGB 11.5* 9.5*  HCT 33.7* 27.4*  PLT 211 174    Recent Labs  Lab 10/18/23 1439  NA 138  K 3.7  CL 105  CO2 23  BUN <5*  CREATININE  0.71  CALCIUM  9.0  PROT 6.2*  BILITOT 0.6  ALKPHOS 151*  ALT 22  AST 20  GLUCOSE 78   Radiology:  No new imaging  Assessment & Plan:  Patient doing well *Pregnancy:reactive NST. Qday NSTs *Right sided pyelo: continue rocephin . Ucx not collected on admit so RN asked to collect this.  -Renal u/s with mod-severe right hydro with rt bladder jet seen *Preterm: no issues *PPx: lovenox  ordered *FEN/GI: regular diet. Pt taking po w/o issue. Recommend large volume PO fluid intake for her. Saline lock IV *Dispo: maybe late tomorrow or on friday  Wrigley Plasencia, Jr MD Attending [facility] [employer] [facility] (Faculty Practice) GYN Consult Phone: [phone] (M-F, [phone]) & [phone]  (Off hours, weekends, holidays)

## 2023-10-20 MED ORDER — SELENIUM SULFIDE 2.5 % EX LOTN
TOPICAL_LOTION | Freq: Every day | CUTANEOUS | Status: DC
  Filled 2023-10-20 (×3): qty 120

## 2023-10-20 MED ORDER — CEFADROXIL 500 MG PO CAPS
500.0000 mg | ORAL_CAPSULE | Freq: Two times a day (BID) | ORAL | Status: DC
  Administered 2023-10-20: 500 mg via ORAL
  Filled 2023-10-20 (×2): qty 1

## 2023-10-20 MED ORDER — SELENIUM SULFIDE 1 % EX LOTN
TOPICAL_LOTION | Freq: Every day | CUTANEOUS | Status: DC
  Filled 2023-10-20: qty 207

## 2023-10-20 NOTE — Progress Notes (Signed)
 Patient ID: Shelby Serrano, female   DOB: 1996/02/29, 28 y.o.   MRN: [id number] FACULTY PRACTICE ANTEPARTUM(COMPREHENSIVE) NOTE  Shelby Serrano is a 28 y.o. [id number] with Estimated Date of Delivery: 01/03/24   By  sonogram [redacted]w[redacted]d  who is admitted for pyelonephritis.    Fetal presentation is unsure. Length of Stay:  2  Days  Date of admission:10/18/2023  Subjective: Pt feels better Patient reports the fetal movement as active. Patient reports uterine contraction  activity as none. Patient reports  vaginal bleeding as none. Patient describes fluid per vagina as None.  Vitals:  Blood pressure (!) 95/50, pulse 62, temperature 98.2 F (36.8 C), temperature source Oral, resp. rate 17, height 5' 1 (1.549 m), weight 48.5 kg, last menstrual period 03/24/2023, SpO2 100%, unknown if currently breastfeeding. Vitals:   10/19/23 1237 10/19/23 1756 10/19/23 1951 10/19/23 2338  BP: (!) 86/50 (!) 106/59 105/71 (!) 95/50  Pulse: 70 68 66 62  Resp: 16 16 16 17   Temp: (!) 97.5 F (36.4 C) 97.7 F (36.5 C) 98.1 F (36.7 C) 98.2 F (36.8 C)  TempSrc: Oral Oral Oral Oral  SpO2: 100% 100% 100% 100%  Weight:      Height:       Physical Examination:  General appearance - alert, well appearing, and in no distress Back exam - mild right CVAT, tinea versicolor rash is present also on front torso Fundal Height:  size equals dates Pelvic Exam:  examination not indicated Cervical Exam: Not evaluated Extremities: extremities normal, atraumatic, no cyanosis or edema with DTRs 2+ bilaterally Membranes:intact  Fetal Monitoring:  Baseline: 140 bpm, Variability: Good {> 6 bpm), Accelerations: Reactive, and Decelerations: Absent   reactive  Labs:  No results found for this or any previous visit (from the past 24 hours).  Imaging Studies:    US  RENAL Result Date: 10/18/2023 CLINICAL DATA:  Right flank pain, [redacted] weeks pregnant EXAM: RENAL / URINARY TRACT ULTRASOUND COMPLETE COMPARISON:  None Available.  FINDINGS: Right Kidney: Renal measurements: 11.4 x 6.2 x 5.7 cm = volume: 212 mL. Moderate-severe hydronephrosis. Normal echogenicity. Left Kidney: Renal measurements: 9.7 x 5.2 x 4.7 cm = volume: 125 mL. Echogenicity within normal limits. No mass or hydronephrosis visualized. Bladder: The ureteral jets were seen bilaterally. Other: None. IMPRESSION: Moderate-severe right hydronephrosis. A right ureteral jet was seen within the bladder. Electronically Signed   By: Norman Gatlin M.D.   On: 10/18/2023 17:06     Medications:  Scheduled  docusate sodium   100 mg Oral Daily   enoxaparin  (LOVENOX ) injection  40 mg Subcutaneous Q24H   prenatal multivitamin  1 tablet Oral Q1200   selenium  sulfide   Topical Daily   selenium  sulfide   Topical Daily   I have reviewed the patient's current medications.  ASSESSMENT: [id number] [redacted]w[redacted]d Estimated Date of Delivery: 01/03/24  Patient Active Problem List   Diagnosis Date Noted   Pyelonephritis affecting pregnancy in third trimester 10/18/2023   Subchorionic hemorrhage in second trimester (RESOLVE) 09/28/2023   Placenta previa in second trimester (RESOLVED) 07/20/2023   History of failed BTL with Filshie clips 07/07/2023   History of placental abruption 06/30/2023   History of preterm delivery, currently pregnant 06/22/2023   Supervision of high risk pregnancy, antepartum 06/14/2023    PLAN: >Continue rocephin  2 grams q24 hours: doses at 2000 >selenium  sulfide lotion and shampoo for her tinea versicolor >evaluate for discharge tomorrow >UC pending  Shelby Serrano 10/20/2023,8:02 AM

## 2023-10-21 ENCOUNTER — Other Ambulatory Visit (HOSPITAL_COMMUNITY): Payer: Self-pay

## 2023-10-21 DIAGNOSIS — O2303 Infections of kidney in pregnancy, third trimester: Principal | ICD-10-CM

## 2023-10-21 DIAGNOSIS — Z3A29 29 weeks gestation of pregnancy: Secondary | ICD-10-CM

## 2023-10-21 LAB — URINE CULTURE: Culture: >=100000 — AB

## 2023-10-21 LAB — CBC
HCT: 29.2 % — ABNORMAL LOW (ref 36.0–46.0)
Hemoglobin: 10.2 g/dL — ABNORMAL LOW (ref 12.0–15.0)
MCH: 29.5 pg (ref 26.0–34.0)
MCHC: 34.9 g/dL (ref 30.0–36.0)
MCV: 84.4 fL (ref 80.0–100.0)
Platelets: 174 10*3/uL (ref 150–400)
RBC: 3.46 MIL/uL — ABNORMAL LOW (ref 3.87–5.11)
RDW: 13.7 % (ref 11.5–15.5)
WBC: 8.7 10*3/uL (ref 4.0–10.5)
nRBC: 0 % (ref 0.0–0.2)

## 2023-10-21 MED ORDER — ONDANSETRON 4 MG PO TBDP
4.0000 mg | ORAL_TABLET | Freq: Once | ORAL | Status: AC
  Administered 2023-10-21: 4 mg via ORAL
  Filled 2023-10-21: qty 1

## 2023-10-21 MED ORDER — CEFADROXIL 500 MG PO CAPS
1000.0000 mg | ORAL_CAPSULE | Freq: Two times a day (BID) | ORAL | Status: DC
  Administered 2023-10-21: 1000 mg via ORAL
  Filled 2023-10-21 (×2): qty 2

## 2023-10-21 MED ORDER — CEFADROXIL 500 MG PO CAPS
1000.0000 mg | ORAL_CAPSULE | Freq: Two times a day (BID) | ORAL | 0 refills | Status: DC
  Filled 2023-10-21: qty 28, 7d supply, fill #0

## 2023-10-21 MED ORDER — OXYCODONE-ACETAMINOPHEN 5-325 MG PO TABS
1.0000 | ORAL_TABLET | Freq: Four times a day (QID) | ORAL | 0 refills | Status: DC | PRN
  Filled 2023-10-21: qty 10, 3d supply, fill #0

## 2023-10-21 NOTE — Progress Notes (Signed)
Discharge instructions and prescriptions given to pt. Discussed signs and symptoms to report to the MD, upcoming appointments, and meds. Pt verbalizes understanding and has no questions or concerns at this time. Pt discharged home from hospital in stable condition. 

## 2023-10-21 NOTE — Discharge Summary (Signed)
 Physician Discharge Summary  Patient ID: Shelby Serrano MRN: [id number] DOB/AGE: October 27, 1995 28 y.o.  Admit date: 10/18/2023 Discharge date: 10/21/2023  Admission Diagnoses: Pyelonephritis  Discharge Diagnoses:  Principal Problem:   Pyelonephritis affecting pregnancy in third trimester   Discharged Condition: good  Hospital Course: 28 yo [id number] admitted at 29 weeks with pyelonephritis. Patient remained afebrile throughout her admission. She received IV antibiotics and was transitioned to oral antibiotics by day of discharge. Maternal-fetal complex remained hemodynamically stable. Discharge precautions reviewed with the patient. Patient to follow up in the office for routine prenatal care with glucola  Consults: None  Significant Diagnostic Studies: labs:     Latest Ref Rng & Units 10/21/2023    4:19 AM 10/19/2023    5:27 AM 10/18/2023    2:39 PM  CBC  WBC 4.0 - 10.5 K/uL 8.7  10.6  12.1   Hemoglobin 12.0 - 15.0 g/dL 89.7  9.5  88.4   Hematocrit 36.0 - 46.0 % 29.2  27.4  33.7   Platelets 150 - 400 K/uL 174  174  211     and microbiology: urine culture: positive for E. coli  Treatments: antibiotics  Discharge Exam: Blood pressure 101/61, pulse 70, temperature 98.4 F (36.9 C), temperature source Oral, resp. rate 18, height 5' 1 (1.549 m), weight 48.5 kg, last menstrual period 03/24/2023, SpO2 99%, unknown if currently breastfeeding. GENERAL: Well-developed, well-nourished female in no acute distress.  LUNGS: Clear to auscultation bilaterally.  HEART: Regular rate and rhythm. ABDOMEN: Soft, nontender, nondistended. No organomegaly. BACK: no CVA tenderness bilaterally PELVIC: Not indicated EXTREMITIES: No cyanosis, clubbing, or edema, 2+ distal pulses.  FHT: baseline 130, mod variability, + accels, no decels TOCO: no contractions  Disposition: home   Allergies as of 10/21/2023   No Known Allergies      Medication List     STOP taking these medications    naproxen   500 MG tablet Commonly known as: NAPROSYN        TAKE these medications    aspirin  EC 81 MG tablet Take 1 tablet (81 mg total) by mouth daily. Swallow whole.   Blood Pressure Kit Devi 1 each by Does not apply route as needed.   cefadroxil  500 MG capsule Commonly known as: DURICEF Take 2 capsules (1,000 mg total) by mouth 2 (two) times daily for 7 days.   oxyCODONE -acetaminophen  5-325 MG tablet Commonly known as: PERCOCET/ROXICET Take 1 tablet by mouth every 6 (six) hours as needed for moderate pain (pain score 4-6) or severe pain (pain score 7-10).   PrePLUS 27-1 MG Tabs Take 1 tablet by mouth daily.        Follow-up Information     [facility] Follow up.   Specialty: Obstetrics and Gynecology Contact information: [address] [place]  [address] [phone]                Signed: Winton Felt 10/21/2023, 9:59 AM

## 2023-10-25 ENCOUNTER — Other Ambulatory Visit: Payer: Self-pay

## 2023-10-25 DIAGNOSIS — O0990 Supervision of high risk pregnancy, unspecified, unspecified trimester: Secondary | ICD-10-CM

## 2023-10-26 ENCOUNTER — Other Ambulatory Visit: Payer: Self-pay

## 2023-10-26 ENCOUNTER — Other Ambulatory Visit: Payer: Self-pay | Admitting: *Deleted

## 2023-10-26 ENCOUNTER — Encounter: Payer: Self-pay | Admitting: Family Medicine

## 2023-10-26 ENCOUNTER — Ambulatory Visit: Payer: 59 | Attending: Maternal & Fetal Medicine

## 2023-10-26 ENCOUNTER — Other Ambulatory Visit: Payer: 59

## 2023-10-26 DIAGNOSIS — O09293 Supervision of pregnancy with other poor reproductive or obstetric history, third trimester: Secondary | ICD-10-CM | POA: Diagnosis not present

## 2023-10-26 DIAGNOSIS — O99013 Anemia complicating pregnancy, third trimester: Secondary | ICD-10-CM | POA: Diagnosis not present

## 2023-10-26 DIAGNOSIS — O0990 Supervision of high risk pregnancy, unspecified, unspecified trimester: Secondary | ICD-10-CM

## 2023-10-26 DIAGNOSIS — O09899 Supervision of other high risk pregnancies, unspecified trimester: Secondary | ICD-10-CM

## 2023-10-26 DIAGNOSIS — Z3A30 30 weeks gestation of pregnancy: Secondary | ICD-10-CM

## 2023-10-26 DIAGNOSIS — Z362 Encounter for other antenatal screening follow-up: Secondary | ICD-10-CM | POA: Insufficient documentation

## 2023-10-26 DIAGNOSIS — D573 Sickle-cell trait: Secondary | ICD-10-CM | POA: Diagnosis not present

## 2023-10-26 DIAGNOSIS — O09213 Supervision of pregnancy with history of pre-term labor, third trimester: Secondary | ICD-10-CM

## 2023-10-27 LAB — GLUCOSE TOLERANCE, 2 HOURS W/ 1HR
Glucose, 1 hour: 111 mg/dL (ref 70–179)
Glucose, 2 hour: 85 mg/dL (ref 70–152)
Glucose, Fasting: 69 mg/dL — ABNORMAL LOW (ref 70–91)

## 2023-10-27 LAB — CBC
Hematocrit: 34 % (ref 34.0–46.6)
Hemoglobin: 11.3 g/dL (ref 11.1–15.9)
MCH: 28.8 pg (ref 26.6–33.0)
MCHC: 33.2 g/dL (ref 31.5–35.7)
MCV: 87 fL (ref 79–97)
Platelets: 173 10*3/uL (ref 150–450)
RBC: 3.92 x10E6/uL (ref 3.77–5.28)
RDW: 13.1 % (ref 11.7–15.4)
WBC: 9.3 10*3/uL (ref 3.4–10.8)

## 2023-10-27 LAB — RPR: RPR Ser Ql: NONREACTIVE

## 2023-10-27 LAB — HIV ANTIBODY (ROUTINE TESTING W REFLEX): HIV Screen 4th Generation wRfx: NONREACTIVE

## 2023-10-28 ENCOUNTER — Inpatient Hospital Stay (HOSPITAL_COMMUNITY)
Admission: AD | Admit: 2023-10-28 | Discharge: 2023-10-29 | Disposition: A | Discharge status: Home / Self Care | Payer: 59 | Attending: Obstetrics and Gynecology | Admitting: Obstetrics and Gynecology

## 2023-10-28 ENCOUNTER — Inpatient Hospital Stay (HOSPITAL_COMMUNITY): Payer: 59

## 2023-10-28 ENCOUNTER — Encounter: Payer: Self-pay | Admitting: Obstetrics & Gynecology

## 2023-10-28 ENCOUNTER — Ambulatory Visit (INDEPENDENT_AMBULATORY_CARE_PROVIDER_SITE_OTHER): Payer: 59 | Admitting: Obstetrics & Gynecology

## 2023-10-28 ENCOUNTER — Encounter (HOSPITAL_COMMUNITY): Payer: Self-pay | Admitting: Obstetrics and Gynecology

## 2023-10-28 ENCOUNTER — Other Ambulatory Visit: Payer: Self-pay

## 2023-10-28 ENCOUNTER — Encounter: Payer: Self-pay | Admitting: Family Medicine

## 2023-10-28 ENCOUNTER — Other Ambulatory Visit (HOSPITAL_COMMUNITY)
Admission: RE | Admit: 2023-10-28 | Discharge: 2023-10-28 | Disposition: A | Discharge status: Home / Self Care | Payer: 59 | Source: Ambulatory Visit

## 2023-10-28 VITALS — BP 95/58 | HR 80 | Wt 119.0 lb

## 2023-10-28 DIAGNOSIS — N898 Other specified noninflammatory disorders of vagina: Secondary | ICD-10-CM | POA: Insufficient documentation

## 2023-10-28 DIAGNOSIS — Z3A30 30 weeks gestation of pregnancy: Secondary | ICD-10-CM

## 2023-10-28 DIAGNOSIS — O2303 Infections of kidney in pregnancy, third trimester: Secondary | ICD-10-CM | POA: Diagnosis not present

## 2023-10-28 DIAGNOSIS — O0930 Supervision of pregnancy with insufficient antenatal care, unspecified trimester: Secondary | ICD-10-CM | POA: Insufficient documentation

## 2023-10-28 DIAGNOSIS — O23593 Infection of other part of genital tract in pregnancy, third trimester: Secondary | ICD-10-CM

## 2023-10-28 DIAGNOSIS — O09893 Supervision of other high risk pregnancies, third trimester: Secondary | ICD-10-CM | POA: Diagnosis not present

## 2023-10-28 DIAGNOSIS — O09899 Supervision of other high risk pregnancies, unspecified trimester: Secondary | ICD-10-CM

## 2023-10-28 DIAGNOSIS — O99891 Other specified diseases and conditions complicating pregnancy: Secondary | ICD-10-CM | POA: Insufficient documentation

## 2023-10-28 DIAGNOSIS — Z3689 Encounter for other specified antenatal screening: Secondary | ICD-10-CM | POA: Diagnosis not present

## 2023-10-28 DIAGNOSIS — O0933 Supervision of pregnancy with insufficient antenatal care, third trimester: Secondary | ICD-10-CM

## 2023-10-28 DIAGNOSIS — R1031 Right lower quadrant pain: Secondary | ICD-10-CM | POA: Diagnosis not present

## 2023-10-28 DIAGNOSIS — N132 Hydronephrosis with renal and ureteral calculous obstruction: Secondary | ICD-10-CM | POA: Insufficient documentation

## 2023-10-28 DIAGNOSIS — O0993 Supervision of high risk pregnancy, unspecified, third trimester: Secondary | ICD-10-CM

## 2023-10-28 DIAGNOSIS — O0990 Supervision of high risk pregnancy, unspecified, unspecified trimester: Secondary | ICD-10-CM

## 2023-10-28 DIAGNOSIS — O26893 Other specified pregnancy related conditions, third trimester: Secondary | ICD-10-CM | POA: Insufficient documentation

## 2023-10-28 DIAGNOSIS — M549 Dorsalgia, unspecified: Secondary | ICD-10-CM | POA: Diagnosis not present

## 2023-10-28 DIAGNOSIS — Z23 Encounter for immunization: Secondary | ICD-10-CM

## 2023-10-28 DIAGNOSIS — N1330 Unspecified hydronephrosis: Secondary | ICD-10-CM

## 2023-10-28 DIAGNOSIS — O09213 Supervision of pregnancy with history of pre-term labor, third trimester: Secondary | ICD-10-CM | POA: Diagnosis not present

## 2023-10-28 HISTORY — DX: Anxiety disorder, unspecified: F41.9

## 2023-10-28 HISTORY — DX: Cardiac murmur, unspecified: R01.1

## 2023-10-28 HISTORY — DX: Infections of kidney in pregnancy, unspecified trimester: O23.00

## 2023-10-28 HISTORY — DX: Anemia, unspecified: D64.9

## 2023-10-28 LAB — CBC
HCT: 32.2 % — ABNORMAL LOW (ref 36.0–46.0)
Hemoglobin: 11.1 g/dL — ABNORMAL LOW (ref 12.0–15.0)
MCH: 29.2 pg (ref 26.0–34.0)
MCHC: 34.5 g/dL (ref 30.0–36.0)
MCV: 84.7 fL (ref 80.0–100.0)
Platelets: 182 10*3/uL (ref 150–400)
RBC: 3.8 MIL/uL — ABNORMAL LOW (ref 3.87–5.11)
RDW: 13.9 % (ref 11.5–15.5)
WBC: 10.4 10*3/uL (ref 4.0–10.5)
nRBC: 0 % (ref 0.0–0.2)

## 2023-10-28 LAB — COMPREHENSIVE METABOLIC PANEL
ALT: 26 U/L (ref 0–44)
AST: 26 U/L (ref 15–41)
Albumin: 2.8 g/dL — ABNORMAL LOW (ref 3.5–5.0)
Alkaline Phosphatase: 118 U/L (ref 38–126)
Anion gap: 7 (ref 5–15)
BUN: 5 mg/dL — ABNORMAL LOW (ref 6–20)
CO2: 23 mmol/L (ref 22–32)
Calcium: 9 mg/dL (ref 8.9–10.3)
Chloride: 104 mmol/L (ref 98–111)
Creatinine, Ser: 0.71 mg/dL (ref 0.44–1.00)
GFR, Estimated: >60 mL/min (ref >60)
Glucose, Bld: 76 mg/dL (ref 70–99)
Potassium: 4 mmol/L (ref 3.5–5.1)
Sodium: 134 mmol/L — ABNORMAL LOW (ref 135–145)
Total Bilirubin: 0.7 mg/dL (ref 0.0–1.2)
Total Protein: 6.3 g/dL — ABNORMAL LOW (ref 6.5–8.1)

## 2023-10-28 LAB — LIPASE, BLOOD: Lipase: 26 U/L (ref 11–51)

## 2023-10-28 MED ORDER — LACTATED RINGERS IV SOLN: INTRAVENOUS | Status: DC

## 2023-10-28 MED ORDER — TAMSULOSIN HCL 0.4 MG PO CAPS
0.4000 mg | ORAL_CAPSULE | Freq: Once | ORAL | Status: AC
  Administered 2023-10-28: 0.4 mg via ORAL
  Filled 2023-10-28: qty 1

## 2023-10-28 MED ORDER — DIPHENHYDRAMINE HCL 50 MG/ML IJ SOLN
25.0000 mg | Freq: Once | INTRAMUSCULAR | Status: AC
  Administered 2023-10-28: 25 mg via INTRAVENOUS
  Filled 2023-10-28: qty 1

## 2023-10-28 MED ORDER — HYDROMORPHONE HCL 1 MG/ML IJ SOLN
1.0000 mg | Freq: Once | INTRAMUSCULAR | Status: AC
  Administered 2023-10-28: 1 mg via INTRAVENOUS
  Filled 2023-10-28: qty 1

## 2023-10-28 MED ORDER — LACTATED RINGERS IV BOLUS
1000.0000 mL | Freq: Once | INTRAVENOUS | Status: AC
  Administered 2023-10-28: 1000 mL via INTRAVENOUS

## 2023-10-28 MED ORDER — ONDANSETRON HCL 4 MG/2ML IJ SOLN
4.0000 mg | Freq: Once | INTRAMUSCULAR | Status: AC
  Administered 2023-10-28: 4 mg via INTRAVENOUS
  Filled 2023-10-28: qty 2

## 2023-10-28 NOTE — Progress Notes (Signed)
   PRENATAL VISIT NOTE  Subjective:  Shelby Serrano is a 28 y.o. [phone] at [redacted]w[redacted]d being seen today for ongoing prenatal care.  She is currently monitored for the following issues for this high-risk pregnancy and has Supervision of high risk pregnancy, antepartum; History of preterm delivery, currently pregnant; History of placental abruption; History of failed BTL with Filshie clips; Pyelonephritis affecting pregnancy in third trimester; and Insufficient prenatal care, no visits between 16-[redacted] weeks gestation on their problem list.  Patient reports  severe right sided lower abdominal pain radiating to her right lower side and back .  She recently was discharged after treatment of pyelonephritis in pregnancy, completing her antibiotic course. Urine culture had showed pansensitive E coli. Also having infrequent contractions.  Contractions: Irritability. Vag. Bleeding: None.  Movement: Present. Denies leaking of fluid.   The following portions of the patient's history were reviewed and updated as appropriate: allergies, current medications, past family history, past medical history, past social history, past surgical history and problem list.   Objective:   Vitals:   10/28/23 1000  BP: (!) 95/58  Pulse: 80  Weight: 119 lb (54 kg)    Fetal Status: Fetal Heart Rate (bpm): 128 Fundal Height: 29 cm Movement: Present     General:  Alert, oriented and cooperative. Patient is in no acute distress.  Skin: Skin is warm and dry. No rash noted.   Cardiovascular: Normal heart rate noted  Respiratory: Normal respiratory effort, no problems with respiration noted  Abdomen: Soft, gravid, appropriate for gestational age.  Pain/Pressure: Present  +Moderate RLQ tenderness, +voluntary guarding.   Pelvic: Cervical exam performed in the presence of a chaperone Dilation: Fingertip Effacement (%): Thick Station: Ballotable.  Watery discharge seen, testing sample obtained  Extremities: Normal range of motion.   Edema: None  Mental Status: Normal mood and affect. Normal behavior. Normal judgment and thought content.   Assessment and Plan:  Pregnancy: [id number] at [redacted]w[redacted]d 1. RLQ abdominal pain and R flank pain Advised to go to MAU for further evaluation. DDx: stones, worsening pyelonephritis, appendicitis, round ligament/pelvic floor pain etc.  No signs/symptoms of PTL for now.  2. Vaginal discharge during pregnancy in third trimester - Cervicovaginal ancillary only done, will follow up results and manage accordingly.  3. Pyelonephritis affecting pregnancy in third trimester (Primary) Complete antibiotic course as recommended  4. History of preterm delivery, currently pregnant PTL precautions reviewed  5. Insufficient prenatal care, no visits between 16-[redacted] weeks gestation Emphasized importance of coming to visits as recommended  6. [redacted] weeks gestation of pregnancy 7. Supervision of high risk pregnancy, antepartum Normal third trimester labs on 10/26/23 Preterm labor symptoms and general obstetric precautions including but not limited to vaginal bleeding, contractions, leaking of fluid and fetal movement were reviewed in detail with the patient. Please refer to After Visit Summary for other counseling recommendations.   Return in about 2 weeks (around 11/11/2023) for OFFICE OB VISIT (MD or APP).  Future Appointments  Date Time Provider Department Center  11/14/2023 10:55 AM Warden Fillers, MD [facility]  11/30/2023 11:30 AM [facility]5 [facility]    Jaynie Collins, MD '

## 2023-10-28 NOTE — MAU Note (Signed)
Shelby Serrano is a 28 y.o. at [redacted]w[redacted]d here in MAU reporting: pain started about 1830 yesterday, took her medicine and her pain medicine. Pain is in RLQ and rt flank. Pain is coming and going, up until 3p.m., pain had been constant.  Went to appt, was still in pain. Was told she was not in labor. Thought it was her appendix or kidney.  Had to wait for husband to get home and get the kids situated.  Mild Rt CVA tenderness. No bleeding or LOF.  Baby has been moving.  Denies GI or GU complaints.  Onset of complaint: 1830 Pain score: abd 8, back 7 Vitals:   10/28/23 1741  BP: (!) 106/59  Pulse: 71  Resp: 18  Temp: 98 F (36.7 C)  SpO2: 99%     FHT:132 Lab orders placed from triage:  urine

## 2023-10-28 NOTE — MAU Note (Signed)
CT consent given by Gerrit Heck, CNM and Charlotta Newton, MD

## 2023-10-28 NOTE — MAU Provider Note (Addendum)
Chief Complaint:  Abdominal Pain and Back Pain   Event Date/Time   First Provider Initiated Contact with Patient 10/28/23 1908      HPI: Shelby Serrano is a 28 y.o. [id number] at [redacted]w[redacted]d who presents to maternity admissions reporting severe RLQ abdominal pain 8/9 since yesterday. Pt took Percocet last night with no relief.  She is unable to sleep due to pain. She presented to her office appt today and Dr Macon Large recommended she come to MAU but  pt unable to come until now r/t childcare.  Pt on abx following admission for pyelonephritis, but did miss 2 doses , pm 2/11 and  am 2/12 due to fasting for GTT.   She reports good fetal movement, denies regular contractions, LOF, vaginal bleeding,  h/a, dizziness, n/v,constipation, or fever/chills.      HPI  Past Medical History: Past Medical History:  Diagnosis Date   Acute chorioamnionitis 01/13/2021   Noted on placental pathology        Anemia    Anxiety    Failed BTL ([place]) 09/28/2020   02/2020     Grand multiparity 10/08/2020   Heart murmur    when she was younger   Left sided abdominal pain 01/12/2021   Low serum vitamin D 10/09/2020   [ ]  recheck at 28wks     Preterm delivery 01/10/2021   Preterm delivery at [redacted]w[redacted]d     Pyelonephritis affecting pregnancy    Sickle cell trait (HCC)    Supervision of high risk pregnancy, antepartum 01/10/2021   Supervision of other normal pregnancy, antepartum 03/10/2016              Nursing Staff    Provider      Office Location    [facility]    Dating      6wk u/s      Language     English    Anatomy US            Flu Vaccine          Genetic Screen     NIPS:  Low risk           TDaP Vaccine          Hgb A1C or   GTT    Early   Third trimester       COVID Vaccine              LAB RESULTS       Rhogam     NA    Blood Type    --/--/O POS, O POS  Performed at [facility]   Vaginal bleeding affecting early pregnancy 10/08/2020    Past obstetric history: OB History  Gravida Para Term Preterm AB Living   9 5 4 1 3 4   SAB IAB Ectopic Multiple Live Births  3 0 0 0 5    # Outcome Date GA Lbr Len/2nd Weight Sex Type Anes PTL Lv  9 Current           8 Preterm 01/10/21 [redacted]w[redacted]d   M Vag-Spont   DEC  7 Term 02/18/20 [redacted]w[redacted]d 05:48 / 00:01 2880 g F Vag-Spont None  LIV  6 Term 08/17/18 [redacted]w[redacted]d 08:00 / 00:08 2745 g M Vag-Spont None  LIV  5 Term 07/15/16 [redacted]w[redacted]d 09:37 / 00:07 2725 g F Vag-Spont EPI  LIV  4 SAB 07/2015 [redacted]w[redacted]d         3 SAB 04/2015  2 Term 04/05/12 [redacted]w[redacted]d  2778 g  Vag-Spont   LIV     Birth Comments: no complications  1 SAB             Past Surgical History: Past Surgical History:  Procedure Laterality Date   TUBAL LIGATION Bilateral 02/18/2020   Procedure: POST PARTUM TUBAL LIGATION;  Surgeon: [place] Bing, MD;  Location: [facility];  Service: Gynecology;  Laterality: Bilateral;    Family History: Family History  Problem Relation Age of Onset   Diabetes Mother    Hypertension Mother    Healthy Father        unaware of any problems    Social History: Social History   Tobacco Use   Smoking status: Former    Current packs/day: 0.00    Types: Cigarettes    Quit date: 02/08/2016    Years since quitting: 7.7   Smokeless tobacco: Never  Vaping Use   Vaping status: Never Used  Substance Use Topics   Alcohol use: Not Currently   Drug use: Not Currently    Types: Marijuana    Comment: Patient denies, Positive UDS 05/04/16 [facility]    Allergies: No Known Allergies  Meds:  Medications Prior to Admission  Medication Sig Dispense Refill Last Dose/Taking   aspirin EC 81 MG tablet Take 1 tablet (81 mg total) by mouth daily. Swallow whole. 30 tablet 12 10/27/2023   cefadroxil (DURICEF) 500 MG capsule Take 2 capsules (1,000 mg total) by mouth 2 (two) times daily for 7 days. 28 capsule 0 10/28/2023   oxyCODONE-acetaminophen (PERCOCET/ROXICET) 5-325 MG tablet Take 1 tablet by mouth every 6 (six) hours as needed for moderate pain (pain score 4-6) or severe pain (pain score 7-10). 10  tablet 0 10/27/2023 Evening   Prenatal Vit-Fe Fumarate-FA (PREPLUS) 27-1 MG TABS Take 1 tablet by mouth daily. 30 tablet 11 10/28/2023   Blood Pressure Monitoring (BLOOD PRESSURE KIT) DEVI 1 each by Does not apply route as needed. 1 each 0     ROS:  Review of Systems  Constitutional:  Negative for chills, fatigue and fever.  Eyes:  Negative for visual disturbance.  Respiratory:  Negative for shortness of breath.   Cardiovascular:  Negative for chest pain.  Gastrointestinal:  Positive for abdominal pain. Negative for constipation, nausea and vomiting.  Genitourinary:  Negative for difficulty urinating, dysuria, flank pain, pelvic pain, vaginal bleeding, vaginal discharge and vaginal pain.  Neurological:  Negative for dizziness and headaches.  Psychiatric/Behavioral: Negative.       I have reviewed patient's Past Medical Hx, Surgical Hx, Family Hx, Social Hx, medications and allergies.   Physical Exam  Patient Vitals for the past 24 hrs:  BP Temp Temp src Pulse Resp SpO2 Height Weight  10/28/23 1800 99/60 -- -- 76 -- -- -- --  10/28/23 1741 (!) 106/59 98 F (36.7 C) Oral 71 18 99 % 5\' 1"  (1.549 m) 53.8 kg   Constitutional: Well-developed, well-nourished female in no acute distress.  Cardiovascular: normal rate Respiratory: normal effort GI: Abd soft, non-tender, gravid appropriate for gestational age.  MS: Extremities nontender, no edema, normal ROM Neurologic: Alert and oriented x 4.  PELVIC EXAM: Deferred   GU: Positive Right CVAT.PELVIC EXAM: Deferred   @ 2000 I  Marcell Barlow, NP assumed care of this patient and on my reassessment exam after 1 mg Dilaudid was administered for pain relief I noted CVAT Positive on Right side with super pubic pain on light palpation - Suspicious for unresolved  Pyelo vs Renal colic, r/o renal stones   Patient is s/p  admission 2/4-10/21/23 for Pyelonephritis and received Rocephin and was sent home on oral antibiotics which patient reports she is still  taking although she admits to missed 2 doses d/t gtt   - IVLR Bolus ordered with Flomax  - Dilaudid 1 mg IVP d/t increased pain s/p my exam  - Patient now en route to renal ultrasound   After return from radiology patient was observed with emesis and IV Zofran ordered   - Renal sono results d/w Dr Charlotta Newton who spoke with Radiology - Plan for CT w/o Contrast - suspicious for right renal stone - Continue IVF  - pain management prn         FHT:  Baseline 130 , moderate variability, accelerations present, no decelerations Contractions: none on toco or to palpation   Labs: Results for orders placed or performed during the hospital encounter of 10/28/23 (from the past 24 hours)  CBC     Status: Abnormal   Collection Time: 10/28/23  7:29 PM  Result Value Ref Range   WBC 10.4 4.0 - 10.5 K/uL   RBC 3.80 (L) 3.87 - 5.11 MIL/uL   Hemoglobin 11.1 (L) 12.0 - 15.0 g/dL   HCT 16.1 (L) 09.6 - 04.5 %   MCV 84.7 80.0 - 100.0 fL   MCH 29.2 26.0 - 34.0 pg   MCHC 34.5 30.0 - 36.0 g/dL   RDW 40.9 81.1 - 91.4 %   Platelets 182 150 - 400 K/uL   nRBC 0.0 0.0 - 0.2 %  Comprehensive metabolic panel     Status: Abnormal   Collection Time: 10/28/23  7:29 PM  Result Value Ref Range   Sodium 134 (L) 135 - 145 mmol/L   Potassium 4.0 3.5 - 5.1 mmol/L   Chloride 104 98 - 111 mmol/L   CO2 23 22 - 32 mmol/L   Glucose, Bld 76 70 - 99 mg/dL   BUN 5 (L) 6 - 20 mg/dL   Creatinine, Ser 7.82 0.44 - 1.00 mg/dL   Calcium 9.0 8.9 - 95.6 mg/dL   Total Protein 6.3 (L) 6.5 - 8.1 g/dL   Albumin 2.8 (L) 3.5 - 5.0 g/dL   AST 26 15 - 41 U/L   ALT 26 0 - 44 U/L   Alkaline Phosphatase 118 38 - 126 U/L   Total Bilirubin 0.7 0.0 - 1.2 mg/dL   GFR, Estimated >21 >30 mL/min   Anion gap 7 5 - 15  Lipase, blood     Status: None   Collection Time: 10/28/23  7:29 PM  Result Value Ref Range   Lipase 26 11 - 51 U/L   --/--/O POS (02/04 1816)  Imaging:  US RENAL Result Date: 10/28/2023 CLINICAL DATA:  [id number] Right lower  quadrant abdominal pain [id number]. Right flank pain. EXAM: RENAL / URINARY TRACT ULTRASOUND COMPLETE COMPARISON:  Ultrasound renal 10/18/2023 FINDINGS: Right Kidney: Renal measurements: 11.1 x 6.5 x 5.2 cm = volume: 197.7 mL. Echogenicity within normal limits. No solid mass. Persistent severe hydronephrosis visualized. Left Kidney: Renal measurements: 9 x 5.6 x 4.9 cm = volume: 129.5 mL. Echogenicity within normal limits. Simple cyst. No solid mass or hydronephrosis visualized. Urinary bladder: Appears normal for degree of bladder distention. Other: None. IMPRESSION: Persistent  severe right nephrolithiasis. Electronically Signed   By: Tish Frederickson M.D.   On: 10/28/2023 21:37   [place] MFM OB FOLLOW UP Result Date: 10/26/2023 ----------------------------------------------------------------------  OBSTETRICS REPORT                       (  Signed Final 10/26/2023 12:30 pm) ---------------------------------------------------------------------- Patient Info  ID #:       [id number]                          D.O.B.:  07-04-1996 (27 yrs)(F)  Name:       Priscille Kluver            Visit Date: 10/26/2023 11:20 am ---------------------------------------------------------------------- Performed By  Attending:        Ma Rings MD         Ref. Address:     [address]                                                             [place], [place]                                                             [address]  Performed By:     Anabel Halon          [facility]  Referred By:      [facility] ---------------------------------------------------------------------- Orders  #  Description                           Code        Ordered By  1  [place] MFM OB FOLLOW UP                    19147.82    Braxton Feathers ----------------------------------------------------------------------  #  Order #                     Accession #                Episode #  1  [id number]  [id number]                 [id number] ---------------------------------------------------------------------- Indications  Poor obstetrical history (Placental Abruption) O09.299  Poor obstetric history: Previous preterm       O09.219  delivery, antepartum (24 w)  Genetic carrier (Carrier for Sickle Cell)      Z14.8  [redacted] weeks gestation of pregnancy                Z3A.30  LR NIPS / Neg AFP ---------------------------------------------------------------------- Fetal Evaluation  Num Of Fetuses:         1  Fetal Heart Rate(bpm):  142  Cardiac Activity:       Observed  Presentation:           Cephalic  Placenta:               Anterior  P. Cord Insertion:      Previously seen  Amniotic Fluid  AFI FV:      Within normal limits  AFI Sum(cm)     %Tile       Largest Pocket(cm)  15.3            54          5.62  RUQ(cm)       RLQ(cm)       LUQ(cm)        LLQ(cm)  4.46          2.49          5.64           2.71 ---------------------------------------------------------------------- Biometry  BPD:      75.5  mm     G. Age:  30w 2d         42  %    CI:        79.31   %    70 - 86                                                          FL/HC:      21.9   %    19.2 - 21.4  HC:       268   mm     G. Age:  29w 1d        3.9  %    HC/AC:      1.04        0.99 - 1.21  AC:      256.9  mm     G. Age:  29w 6d         36  %    FL/BPD:     77.6   %    71 - 87  FL:       58.6  mm     G. Age:  30w 4d         48  %    FL/AC:      22.8   %    20 - 24  LV:        4.1  mm  Est. FW:    1504  gm      3 lb 5 oz     34  % ---------------------------------------------------------------------- OB History  Blood Type:   O+  Gravidity:    9  Term:   4        Prem:   1        SAB:   3  TOP:          0       Ectopic:  0        Living: 4  ---------------------------------------------------------------------- Gestational Age  U/S Today:     30w 0d                                        EDD:   01/04/24  Best:          30w 1d     Det. By:  U/S C R L  (05/25/23)    EDD:   01/03/24 ---------------------------------------------------------------------- Targeted Anatomy  Central Nervous System  Calvarium/Cranial V.:  Appears normal         Cereb./Vermis:          Previously seen  Cavum:                 Previously seen        [place] [employer]:         Previously seen  Lateral Ventricles:    Previously seen        Midline Falx:           Previously seen  Choroid Plexus:        Previously seen  Spine  Cervical:              Previously seen        Sacral:                 Previously seen  Thoracic:              Previously seen        Shape/Curvature:        Previously seen  Lumbar:                Previously seen  Head/Neck  Lips:                  Previously seen        Profile:                Previously seen  Neck:                  Previously seen        Orbits/Eyes:            Previously seen  Nuchal Fold:           Previously seen        Mandible:               Previously seen  Nasal Bone:            Previously seen        Maxilla:                Previously seen  Thorax  4 Chamber View:        Previously seen        Interventr. Septum:     Previously seen  Cardiac Situs:         Previously seen        Cardiac Axis:           Previously seen  Rt Outflow Tract:      Previously  seen        Diaphragm:              Appears normal  Lt Outflow Tract:      Previously seen        3 Vessel View:          Previously seen  Aortic Arch:           Previously seen        3 V Trachea View:       Previously seen  Ductal Arch:           Previously seen        IVC:                    Previously seen  SVC:                   Previously seen        Crossing:               Previously seen  Abdomen  Ventral Wall:          Previously seen        Lt Kidney:              Appears normal   Cord Insertion:        Previously seen        Rt Kidney:              Appears normal  Situs:                 Previously seen        Bladder:                Appears normal  Stomach:               Appears normal  Extremities  Lt Humerus:            Previously seen        Lt Femur:               Previously seen  Rt Humerus:            Previously seen        Rt Femur:               Previously seen  Lt Forearm:            Previously seen        Lt Lower Leg:           Previously seen  Rt Forearm:            Previously seen        Rt Lower Leg:           Previously seen  Lt Hand:               Previously seen        Lt Foot:                Heel prev visualized  Rt Hand:               Previously seen        Rt Foot:                Heel prev visualized  Other  Umbilical Cord:        Previously seen ---------------------------------------------------------------------- Comments  This patient was seen for a follow up growth scan due  to a  prior preterm birth at 24 weeks.  She denies any problems  since her last exam.  She was informed that the fetal growth and amniotic fluid  level appears appropriate for her gestational age.  Due to her prior poor history, a follow-up growth scan was  scheduled in 5 weeks. ----------------------------------------------------------------------                   Ma Rings, MD Electronically Signed Final Report   10/26/2023 12:30 pm ----------------------------------------------------------------------   US RENAL Result Date: 10/18/2023 CLINICAL DATA:  Right flank pain, [redacted] weeks pregnant EXAM: RENAL / URINARY TRACT ULTRASOUND COMPLETE COMPARISON:  None Available. FINDINGS: Right Kidney: Renal measurements: 11.4 x 6.2 x 5.7 cm = volume: 212 mL. Moderate-severe hydronephrosis. Normal echogenicity. Left Kidney: Renal measurements: 9.7 x 5.2 x 4.7 cm = volume: 125 mL. Echogenicity within normal limits. No mass or hydronephrosis visualized. Bladder: The ureteral jets were seen bilaterally. Other:  None. IMPRESSION: Moderate-severe right hydronephrosis. A right ureteral jet was seen within the bladder. Electronically Signed   By: Minerva Fester M.D.   On: 10/18/2023 17:06    MAU Course/MDM: Orders Placed This Encounter  Procedures   US RENAL   Urinalysis, Routine w reflex microscopic -Urine, Clean Catch   CBC   Comprehensive metabolic panel   Lipase, blood   Insert peripheral IV    Meds ordered this encounter  Medications   HYDROmorphone (DILAUDID) injection 1 mg   HYDROmorphone (DILAUDID) injection 1 mg    Refill:  0   lactated ringers bolus 1,000 mL   tamsulosin (FLOMAX) capsule 0.4 mg   ondansetron (ZOFRAN) injection 4 mg     NST reviewed and reactive Pt cervix closed in office today, pain is unchanged and similar to pain with pyelo per pt Labs, pain medication, renal US ordered Report to Marcell Barlow, NP, with results pending  DW DR Charlotta Newton at 2152 ( Admission vs expectant management)  Dr Charlotta Newton at  the bedside to discuss CT scan  Patient status report signed out to Sabas Sous , CNM at 2200  Marcell Barlow, MSN, [facility], [facility] for [facility]    Sharen Counter Certified Nurse-Midwife 10/28/2023 9:52 PM   CT Renal Stone Study Result Date: 10/28/2023 CLINICAL DATA:  Abdominal/flank pain, stone suspected Flank pain (pregnant) EXAM: CT ABDOMEN AND PELVIS WITHOUT CONTRAST TECHNIQUE: Multidetector CT imaging of the abdomen and pelvis was performed following the standard protocol without IV contrast. RADIATION DOSE REDUCTION: This exam was performed according to the departmental dose-optimization program which includes automated exposure control, adjustment of the mA and/or kV according to patient size and/or use of iterative reconstruction technique. COMPARISON:  Renal ultrasound earlier today FINDINGS: Lower chest: Clear lung bases. Hepatobiliary: No focal liver abnormality is seen. No gallstones, gallbladder wall thickening, or biliary  dilatation. Pancreas: Grossly negative. Spleen: Normal in size without focal abnormality. Adrenals/Urinary Tract: The adrenal glands are difficult to define on the current exam. Again seen severe right hydronephrosis. Ureter is dilated to the pelvic brim. No renal or ureteral stones. Lesion arising from the upper left kidney corresponds to cyst on ultrasound. No left hydronephrosis. Urinary bladder is partially distended. No bladder stone or wall thickening. Stomach/Bowel: No evidence of bowel obstruction or inflammation. The bowel is displaced peripherally due to gravid uterus. Moderate volume of colonic stool. The appendix is not definitively seen. There is no evidence of appendicitis. Vascular/Lymphatic: Normal caliber abdominal aorta. No bulky abdominopelvic adenopathy. Reproductive: Gravid uterus,  fetus is in head down positioning. No adnexal mass. Other: No ascites or free air. Probable small lipoma of the anterior abdominal wall. Musculoskeletal: Pubic symphyseal diastasis may be related to pregnancy. No fracture. IMPRESSION: 1. Severe right hydronephrosis. Ureter is dilated to the pelvic brim. No renal or ureteral stones. 2. Gravid uterus, fetus is in head down positioning. Electronically Signed   By: Narda Rutherford M.D.   On: 10/28/2023 23:35   US RENAL Addendum Date: 10/28/2023 ADDENDUM REPORT: 10/28/2023 21:53 ADDENDUM: Persistent severe right hydronephrosis. (NOT nephrolithiasis-cannot tell etiology from this [place].) These results were called by telephone at the time of interpretation on 10/28/2023 at 9:52 pm to provider LISA LEFTWICH-KIRBY , who verbally acknowledged these results. Electronically Signed   By: Tish Frederickson M.D.   On: 10/28/2023 21:53   Result Date: 10/28/2023 CLINICAL DATA:  [id number] Right lower quadrant abdominal pain [id number]. Right flank pain. EXAM: RENAL / URINARY TRACT ULTRASOUND COMPLETE COMPARISON:  Ultrasound renal 10/18/2023 FINDINGS: Right Kidney: Renal measurements: 11.1 x  6.5 x 5.2 cm = volume: 197.7 mL. Echogenicity within normal limits. No solid mass. Persistent severe hydronephrosis visualized. Left Kidney: Renal measurements: 9 x 5.6 x 4.9 cm = volume: 129.5 mL. Echogenicity within normal limits. Simple cyst. No solid mass or hydronephrosis visualized. Urinary bladder: Appears normal for degree of bladder distention. Other: None. IMPRESSION: Persistent  severe right nephrolithiasis. Electronically Signed: By: Tish Frederickson M.D. On: 10/28/2023 21:37   -Results as above. -Provider to bedside to discuss. -Patient denies current pain.  -Patient states she would like to be discharged. -Informed that provider agreeable, but results have not been reviewed with MD. -UA still not resulted and nurse instructed to look into. -Discussed sending results via mychart as well as any MD recommendations. -Patient agreeable. -Will also send limited script for percocet for pain management. -Encouraged to call primary office or return to MAU if symptoms worsen or with the onset of new symptoms. -Discharged to home in stable condition.  Cherre Robins MSN, CNM Advanced Practice Provider, Center for [facility]  Addendum 12:34 AM -Provider called to bedside as patient with additional questions regarding stones. -Informed that provider can not definitively say if patient had stones and/or passed them.  What can be confirmed is that she has hydronephrosis which is a common finding during pregnancy.   -Reiterated pain management at home and return precautions.   Cherre Robins MSN, CNM Advanced Practice Provider, Center for [employer]

## 2023-10-29 DIAGNOSIS — O99891 Other specified diseases and conditions complicating pregnancy: Secondary | ICD-10-CM | POA: Diagnosis not present

## 2023-10-29 LAB — URINALYSIS, ROUTINE W REFLEX MICROSCOPIC
Bilirubin Urine: NEGATIVE
Glucose, UA: NEGATIVE mg/dL
Hgb urine dipstick: NEGATIVE
Ketones, ur: NEGATIVE mg/dL
Leukocytes,Ua: NEGATIVE
Nitrite: NEGATIVE
Protein, ur: NEGATIVE mg/dL
Specific Gravity, Urine: 1.009 (ref 1.005–1.030)
pH: 6 (ref 5.0–8.0)

## 2023-10-29 MED ORDER — HYDROMORPHONE HCL 1 MG/ML IJ SOLN
1.0000 mg | Freq: Once | INTRAMUSCULAR | Status: AC
  Administered 2023-10-29: 1 mg via INTRAVENOUS
  Filled 2023-10-29: qty 1

## 2023-10-29 MED ORDER — OXYCODONE-ACETAMINOPHEN 5-325 MG PO TABS: 1.0000 | ORAL_TABLET | Freq: Four times a day (QID) | ORAL | 0 refills | Status: DC | PRN

## 2023-10-31 LAB — CERVICOVAGINAL ANCILLARY ONLY
Bacterial Vaginitis (gardnerella): NEGATIVE
Candida Glabrata: NEGATIVE
Candida Vaginitis: POSITIVE — AB
Chlamydia: NEGATIVE
Comment: NEGATIVE
Comment: NEGATIVE
Comment: NEGATIVE
Comment: NEGATIVE
Comment: NEGATIVE
Comment: NORMAL
Neisseria Gonorrhea: NEGATIVE
Trichomonas: NEGATIVE

## 2023-11-01 ENCOUNTER — Encounter: Payer: Self-pay | Admitting: Obstetrics & Gynecology

## 2023-11-01 MED ORDER — MICONAZOLE NITRATE 2 % EX CREA: 1.0000 | TOPICAL_CREAM | Freq: Two times a day (BID) | CUTANEOUS | 0 refills | Status: DC

## 2023-11-01 NOTE — Addendum Note (Signed)
Addended by: Jaynie Collins A on: 11/01/2023 05:48 PM   Modules accepted: Orders

## 2023-11-14 ENCOUNTER — Ambulatory Visit (INDEPENDENT_AMBULATORY_CARE_PROVIDER_SITE_OTHER): Payer: 59 | Admitting: Obstetrics and Gynecology

## 2023-11-14 ENCOUNTER — Other Ambulatory Visit: Payer: Self-pay

## 2023-11-14 VITALS — BP 95/60 | HR 75 | Wt 122.6 lb

## 2023-11-14 DIAGNOSIS — O09893 Supervision of other high risk pregnancies, third trimester: Secondary | ICD-10-CM

## 2023-11-14 DIAGNOSIS — O0933 Supervision of pregnancy with insufficient antenatal care, third trimester: Secondary | ICD-10-CM

## 2023-11-14 DIAGNOSIS — O0990 Supervision of high risk pregnancy, unspecified, unspecified trimester: Secondary | ICD-10-CM

## 2023-11-14 DIAGNOSIS — Z3A32 32 weeks gestation of pregnancy: Secondary | ICD-10-CM | POA: Diagnosis not present

## 2023-11-14 DIAGNOSIS — Z8759 Personal history of other complications of pregnancy, childbirth and the puerperium: Secondary | ICD-10-CM

## 2023-11-14 DIAGNOSIS — O09899 Supervision of other high risk pregnancies, unspecified trimester: Secondary | ICD-10-CM

## 2023-11-14 DIAGNOSIS — O0993 Supervision of high risk pregnancy, unspecified, third trimester: Secondary | ICD-10-CM

## 2023-11-14 DIAGNOSIS — Z9851 Tubal ligation status: Secondary | ICD-10-CM

## 2023-11-14 NOTE — Progress Notes (Signed)
   PRENATAL VISIT NOTE  Subjective:  Shelby Serrano is a 28 y.o. [phone] at [redacted]w[redacted]d being seen today for ongoing prenatal care.  She is currently monitored for the following issues for this high-risk pregnancy and has Supervision of high risk pregnancy, antepartum; History of preterm delivery, currently pregnant; History of placental abruption; History of failed BTL with Filshie clips; Pyelonephritis affecting pregnancy in third trimester; and Insufficient prenatal care, no visits between 16-[redacted] weeks gestation on their problem list.  Patient doing well with no acute concerns today. She reports  occasional pregnancy discomfort .  Contractions: Not present. Vag. Bleeding: None.  Movement: Present. Denies leaking of fluid.   The following portions of the patient's history were reviewed and updated as appropriate: allergies, current medications, past family history, past medical history, past social history, past surgical history and problem list. Problem list updated.  Objective:   Vitals:   11/14/23 1113  BP: 95/60  Pulse: 75  Weight: 122 lb 9.6 oz (55.6 kg)    Fetal Status: Fetal Heart Rate (bpm): 155 Fundal Height: 32 cm Movement: Present     General:  Alert, oriented and cooperative. Patient is in no acute distress.  Skin: Skin is warm and dry. No rash noted.   Cardiovascular: Normal heart rate noted  Respiratory: Normal respiratory effort, no problems with respiration noted  Abdomen: Soft, gravid, appropriate for gestational age.  Pain/Pressure: Present     Pelvic: Cervical exam deferred        Extremities: Normal range of motion.  Edema: None  Mental Status:  Normal mood and affect. Normal behavior. Normal judgment and thought content.   Assessment and Plan:  Pregnancy: [id number] at [redacted]w[redacted]d  1. [redacted] weeks gestation of pregnancy (Primary)   2. History of placental abruption No s/sx of abruption, previa has resolved  3. History of preterm delivery, currently pregnant No s/sx of  preterm labor  4. Insufficient prenatal care in third trimester   5. Supervision of high risk pregnancy, antepartum Continue routine prenatal care  6. History of failed BTL with Filshie clips Pt is unsure regarding another procedure, she is leaning towards depo provera for now  Preterm labor symptoms and general obstetric precautions including but not limited to vaginal bleeding, contractions, leaking of fluid and fetal movement were reviewed in detail with the patient.  Please refer to After Visit Summary for other counseling recommendations.   Return in about 2 weeks (around 11/28/2023) for [facility], in person.   Mariel Aloe, MD Faculty Attending Center for [facility]

## 2023-11-14 NOTE — Patient Instructions (Signed)
   Considering Waterbirth? Guide for patients at Center for [employer] [facility]) Why consider waterbirth? Gentle birth for babies  Less pain medicine used in labor  May allow for passive descent/less pushing  May reduce perineal tears  More mobility and instinctive maternal position changes  Increased maternal relaxation   Is waterbirth safe? What are the risks of infection, drowning or other complications? Infection:  Very low risk (3.7 % for tub vs 4.8% for bed)  7 in 8000 waterbirths with documented infection  Poorly cleaned equipment most common cause  Slightly lower group B strep transmission rate  Drowning  Maternal:  Very low risk  Related to seizures or fainting  Newborn:  Very low risk. No evidence of increased risk of respiratory problems in multiple large studies  Physiological protection from breathing under water  Avoid underwater birth if there are any fetal complications  Once baby's head is out of the water, keep it out.  Birth complication  Some reports of cord trauma, but risk decreased by bringing baby to surface gradually  No evidence of increased risk of shoulder dystocia. Mothers can usually change positions faster in water than in a bed, possibly aiding the maneuvers to free the shoulder.   There are 2 things you MUST do to have a waterbirth with [facility]: Attend a waterbirth class at [facility] at [facility]   3rd Wednesday of every month from 7-9 pm (virtual during COVID) [employer] at [email or web address] or [email or web address] or by calling [phone] Bring [place] the certificate from the class to your prenatal appointment or send via MyChart Meet with a midwife at 36 weeks* to see if you can still plan a waterbirth and to sign the consent.   *We also recommend that you schedule as many of your prenatal visits with a midwife as possible.    Helpful information: You may want to bring a bathing suit top to the hospital  to wear during labor but this is optional.  All other supplies are provided by the hospital. Please arrive at the hospital with signs of active labor, and do not wait at home until late in labor. It takes 45 min- 1 hour for fetal monitoring, and check in to your room to take place, plus transport and filling of the waterbirth tub.    Things that would prevent you from having a waterbirth: Premature, <37wks  Previous cesarean birth  Presence of thick meconium-stained fluid  Multiple gestation (Twins, triplets, etc.)  Uncontrolled diabetes or gestational diabetes requiring medication  Hypertension diagnosed in pregnancy or preexisting hypertension (gestational hypertension, preeclampsia, or chronic hypertension) Fetal growth restriction (your baby measures less than 10th percentile on ultrasound) Heavy vaginal bleeding  Non-reassuring fetal heart rate  Active infection (MRSA, etc.). Group B Strep is NOT a contraindication for waterbirth.  If your labor has to be induced and induction method requires continuous monitoring of the baby's heart rate  Other risks/issues identified by your obstetrical provider   Please remember that birth is unpredictable. Under certain unforeseeable circumstances your provider may advise against giving birth in the tub. These decisions will be made on a case-by-case basis and with the safety of you and your baby as our highest priority.    Updated 12/16/21

## 2023-11-30 ENCOUNTER — Other Ambulatory Visit: Payer: Self-pay | Admitting: *Deleted

## 2023-11-30 ENCOUNTER — Ambulatory Visit: Payer: 59 | Attending: Obstetrics and Gynecology

## 2023-11-30 ENCOUNTER — Ambulatory Visit: Admitting: Family Medicine

## 2023-11-30 ENCOUNTER — Other Ambulatory Visit: Payer: Self-pay

## 2023-11-30 VITALS — BP 88/46 | HR 98 | Wt 124.0 lb

## 2023-11-30 DIAGNOSIS — D563 Thalassemia minor: Secondary | ICD-10-CM

## 2023-11-30 DIAGNOSIS — Z3A35 35 weeks gestation of pregnancy: Secondary | ICD-10-CM

## 2023-11-30 DIAGNOSIS — O0990 Supervision of high risk pregnancy, unspecified, unspecified trimester: Secondary | ICD-10-CM

## 2023-11-30 DIAGNOSIS — Z8759 Personal history of other complications of pregnancy, childbirth and the puerperium: Secondary | ICD-10-CM

## 2023-11-30 DIAGNOSIS — O365930 Maternal care for other known or suspected poor fetal growth, third trimester, not applicable or unspecified: Secondary | ICD-10-CM | POA: Diagnosis not present

## 2023-11-30 DIAGNOSIS — O09213 Supervision of pregnancy with history of pre-term labor, third trimester: Secondary | ICD-10-CM | POA: Diagnosis not present

## 2023-11-30 DIAGNOSIS — O09899 Supervision of other high risk pregnancies, unspecified trimester: Secondary | ICD-10-CM | POA: Insufficient documentation

## 2023-11-30 DIAGNOSIS — O0933 Supervision of pregnancy with insufficient antenatal care, third trimester: Secondary | ICD-10-CM | POA: Diagnosis not present

## 2023-11-30 DIAGNOSIS — Z9851 Tubal ligation status: Secondary | ICD-10-CM

## 2023-11-30 DIAGNOSIS — O99013 Anemia complicating pregnancy, third trimester: Secondary | ICD-10-CM | POA: Diagnosis not present

## 2023-11-30 DIAGNOSIS — O09893 Supervision of other high risk pregnancies, third trimester: Secondary | ICD-10-CM

## 2023-11-30 DIAGNOSIS — O0993 Supervision of high risk pregnancy, unspecified, third trimester: Secondary | ICD-10-CM | POA: Diagnosis not present

## 2023-11-30 DIAGNOSIS — M549 Dorsalgia, unspecified: Secondary | ICD-10-CM

## 2023-11-30 DIAGNOSIS — O09293 Supervision of pregnancy with other poor reproductive or obstetric history, third trimester: Secondary | ICD-10-CM | POA: Diagnosis not present

## 2023-11-30 DIAGNOSIS — O99891 Other specified diseases and conditions complicating pregnancy: Secondary | ICD-10-CM

## 2023-11-30 DIAGNOSIS — Z362 Encounter for other antenatal screening follow-up: Secondary | ICD-10-CM

## 2023-11-30 MED ORDER — CYCLOBENZAPRINE HCL 10 MG PO TABS: 10.0000 mg | ORAL_TABLET | Freq: Three times a day (TID) | ORAL | 1 refills | Status: DC | PRN

## 2023-11-30 NOTE — Progress Notes (Signed)
   PRENATAL VISIT NOTE  Subjective:  Shelby Serrano is a 28 y.o. [phone] at [redacted]w[redacted]d being seen today for ongoing prenatal care.  She is currently monitored for the following issues for this high-risk pregnancy and has Supervision of high risk pregnancy, antepartum; History of preterm delivery, currently pregnant; History of placental abruption; History of failed BTL with Filshie clips; Pyelonephritis affecting pregnancy in third trimester; and Insufficient prenatal care, no visits between 16-[redacted] weeks gestation on their problem list.  Patient reports backache.  Contractions: Not present. Vag. Bleeding: None.  Movement: Present. Denies leaking of fluid.   The following portions of the patient's history were reviewed and updated as appropriate: allergies, current medications, past family history, past medical history, past social history, past surgical history and problem list.   Objective:   Vitals:   11/30/23 1018  BP: (!) 88/46  Pulse: 98  Weight: 124 lb (56.2 kg)    Fetal Status: Fetal Heart Rate (bpm): 136 Fundal Height: 35 cm Movement: Present     General:  Alert, oriented and cooperative. Patient is in no acute distress.  Skin: Skin is warm and dry. No rash noted.   Cardiovascular: Normal heart rate noted  Respiratory: Normal respiratory effort, no problems with respiration noted  Abdomen: Soft, gravid, appropriate for gestational age.  Pain/Pressure: Present     Pelvic: Cervical exam deferred        Extremities: Normal range of motion.  Edema: None  Mental Status: Normal mood and affect. Normal behavior. Normal judgment and thought content.   Assessment and Plan:  Pregnancy: [id number] at [redacted]w[redacted]d 1. [redacted] weeks gestation of pregnancy (Primary)  2. Insufficient prenatal care in third trimester No care from 16-30  3. Supervision of high risk pregnancy, antepartum Up to date Concerned about back pain-- offered flexeril, accepted. Discussed hydrotherapy for back pain FH  appropriate Desires WB-- has had two unmedicated births. Would like to try water  - Pt is interested in waterbirth.  No contraindications at this time per chart review/patient assessment.   - Pt to enroll in class, see CNMs for most visits in the office.  - Discussed waterbirth as option for low-risk pregnancy.  Reviewed conditions that may arise during pregnancy that will risk pt out of waterbirth including hypertension, diabetes, fetal growth restriction <10%ile, etc.  4. History of preterm delivery, currently pregnant  5. History of failed BTL with Filshie clips Plans on depo pp  6. History of placental abruption 24 wk with neonatal demise  Preterm labor symptoms and general obstetric precautions including but not limited to vaginal bleeding, contractions, leaking of fluid and fetal movement were reviewed in detail with the patient. Please refer to After Visit Summary for other counseling recommendations.   Return in about 1 week (around 12/07/2023) for Routine prenatal care.  Future Appointments  Date Time Provider Department Center  11/30/2023 11:30 AM [facility] US5 [facility]    Federico Flake, MD

## 2023-11-30 NOTE — Patient Instructions (Signed)
   Considering Waterbirth? Guide for patients at Center for [employer] [facility]) Why consider waterbirth? Gentle birth for babies  Less pain medicine used in labor  May allow for passive descent/less pushing  May reduce perineal tears  More mobility and instinctive maternal position changes  Increased maternal relaxation   Is waterbirth safe? What are the risks of infection, drowning or other complications? Infection:  Very low risk (3.7 % for tub vs 4.8% for bed)  7 in 8000 waterbirths with documented infection  Poorly cleaned equipment most common cause  Slightly lower group B strep transmission rate  Drowning  Maternal:  Very low risk  Related to seizures or fainting  Newborn:  Very low risk. No evidence of increased risk of respiratory problems in multiple large studies  Physiological protection from breathing under water  Avoid underwater birth if there are any fetal complications  Once baby's head is out of the water, keep it out.  Birth complication  Some reports of cord trauma, but risk decreased by bringing baby to surface gradually  No evidence of increased risk of shoulder dystocia. Mothers can usually change positions faster in water than in a bed, possibly aiding the maneuvers to free the shoulder.   There are 2 things you MUST do to have a waterbirth with [facility]: Attend a waterbirth class at [facility] at [facility]   3rd Wednesday of every month from 7-9 pm (virtual during COVID) [employer] at [email or web address] or [email or web address] or by calling [phone] Bring [place] the certificate from the class to your prenatal appointment or send via MyChart Meet with a midwife at 36 weeks* to see if you can still plan a waterbirth and to sign the consent.   *We also recommend that you schedule as many of your prenatal visits with a midwife as possible.    Helpful information: You may want to bring a bathing suit top to the hospital  to wear during labor but this is optional.  All other supplies are provided by the hospital. Please arrive at the hospital with signs of active labor, and do not wait at home until late in labor. It takes 45 min- 1 hour for fetal monitoring, and check in to your room to take place, plus transport and filling of the waterbirth tub.    Things that would prevent you from having a waterbirth: Premature, <37wks  Previous cesarean birth  Presence of thick meconium-stained fluid  Multiple gestation (Twins, triplets, etc.)  Uncontrolled diabetes or gestational diabetes requiring medication  Hypertension diagnosed in pregnancy or preexisting hypertension (gestational hypertension, preeclampsia, or chronic hypertension) Fetal growth restriction (your baby measures less than 10th percentile on ultrasound) Heavy vaginal bleeding  Non-reassuring fetal heart rate  Active infection (MRSA, etc.). Group B Strep is NOT a contraindication for waterbirth.  If your labor has to be induced and induction method requires continuous monitoring of the baby's heart rate  Other risks/issues identified by your obstetrical provider   Please remember that birth is unpredictable. Under certain unforeseeable circumstances your provider may advise against giving birth in the tub. These decisions will be made on a case-by-case basis and with the safety of you and your baby as our highest priority.    Updated 12/16/21

## 2023-12-07 ENCOUNTER — Other Ambulatory Visit: Payer: Self-pay

## 2023-12-07 ENCOUNTER — Other Ambulatory Visit (HOSPITAL_COMMUNITY)
Admission: RE | Admit: 2023-12-07 | Discharge: 2023-12-07 | Disposition: A | Discharge status: Home / Self Care | Source: Ambulatory Visit | Attending: Family Medicine | Admitting: Family Medicine

## 2023-12-07 ENCOUNTER — Ambulatory Visit: Admitting: Family Medicine

## 2023-12-07 VITALS — BP 99/64 | HR 90 | Wt 127.0 lb

## 2023-12-07 DIAGNOSIS — Z8759 Personal history of other complications of pregnancy, childbirth and the puerperium: Secondary | ICD-10-CM

## 2023-12-07 DIAGNOSIS — O0933 Supervision of pregnancy with insufficient antenatal care, third trimester: Secondary | ICD-10-CM

## 2023-12-07 DIAGNOSIS — O99891 Other specified diseases and conditions complicating pregnancy: Secondary | ICD-10-CM

## 2023-12-07 DIAGNOSIS — Z9851 Tubal ligation status: Secondary | ICD-10-CM | POA: Diagnosis not present

## 2023-12-07 DIAGNOSIS — O0993 Supervision of high risk pregnancy, unspecified, third trimester: Secondary | ICD-10-CM | POA: Diagnosis not present

## 2023-12-07 DIAGNOSIS — O09893 Supervision of other high risk pregnancies, third trimester: Secondary | ICD-10-CM | POA: Diagnosis not present

## 2023-12-07 DIAGNOSIS — O09899 Supervision of other high risk pregnancies, unspecified trimester: Secondary | ICD-10-CM

## 2023-12-07 DIAGNOSIS — Z3A36 36 weeks gestation of pregnancy: Secondary | ICD-10-CM

## 2023-12-07 DIAGNOSIS — M549 Dorsalgia, unspecified: Secondary | ICD-10-CM

## 2023-12-07 DIAGNOSIS — O0990 Supervision of high risk pregnancy, unspecified, unspecified trimester: Secondary | ICD-10-CM | POA: Insufficient documentation

## 2023-12-07 NOTE — Progress Notes (Signed)
   PRENATAL VISIT NOTE  Subjective:  Shelby Serrano is a 28 y.o. [phone] at [redacted]w[redacted]d being seen today for ongoing prenatal care.  She is currently monitored for the following issues for this high-risk pregnancy and has Supervision of high risk pregnancy, antepartum; History of preterm delivery, currently pregnant; History of placental abruption; History of failed BTL with Filshie clips; Pyelonephritis affecting pregnancy in third trimester; and Insufficient prenatal care, no visits between 16-[redacted] weeks gestation on their problem list.  Patient reports no complaints.  Contractions: Not present. Vag. Bleeding: None.  Movement: Present. Denies leaking of fluid.   The following portions of the patient's history were reviewed and updated as appropriate: allergies, current medications, past family history, past medical history, past social history, past surgical history and problem list.   Objective:   Vitals:   12/07/23 1109  BP: 99/64  Pulse: 90  Weight: 127 lb (57.6 kg)    Fetal Status: Fetal Heart Rate (bpm): 137   Movement: Present     General:  Alert, oriented and cooperative. Patient is in no acute distress.  Skin: Skin is warm and dry. No rash noted.   Cardiovascular: Normal heart rate noted  Respiratory: Normal respiratory effort, no problems with respiration noted  Abdomen: Soft, gravid, appropriate for gestational age.  Pain/Pressure: Absent     Pelvic: Cervical exam performed in the presence of a chaperone Dilation: 1 Effacement (%): Thick Station: -3  Extremities: Normal range of motion.  Edema: None  Mental Status: Normal mood and affect. Normal behavior. Normal judgment and thought content.   Assessment and Plan:  Pregnancy: [id number] at [redacted]w[redacted]d 1. Insufficient prenatal care in third trimester (Primary)  2. Supervision of high risk pregnancy, antepartum Up to date FH appropriate Vigorous movement Having back pain- offered and given pregnancy support belt  3. History of  preterm delivery, currently pregnant Now nearly term  4. History of failed BTL with Filshie clip  5. History of placental abruption  Preterm labor symptoms and general obstetric precautions including but not limited to vaginal bleeding, contractions, leaking of fluid and fetal movement were reviewed in detail with the patient. Please refer to After Visit Summary for other counseling recommendations.   Return in about 1 week (around 12/14/2023) for Routine prenatal care.  Future Appointments  Date Time Provider Department Center  12/14/2023  8:55 AM Federico Flake, MD [facility]  12/21/2023  8:55 AM [place] Bing, MD [facility]  12/22/2023  7:30 AM [facility] US5 [facility]  12/28/2023 11:15 AM Federico Flake, MD [facility]    Federico Flake, MD

## 2023-12-08 LAB — GC/CHLAMYDIA PROBE AMP (~~LOC~~) NOT AT ARMC
Chlamydia: NEGATIVE
Comment: NEGATIVE
Comment: NORMAL
Neisseria Gonorrhea: NEGATIVE

## 2023-12-11 LAB — CULTURE, BETA STREP (GROUP B ONLY): Strep Gp B Culture: NEGATIVE

## 2023-12-13 NOTE — Progress Notes (Unsigned)
   PRENATAL VISIT NOTE  Subjective:  Shelby Serrano is a 28 y.o. [phone] at [redacted]w[redacted]d being seen today for ongoing prenatal care.  She is currently monitored for the following issues for this {Blank single:19197::"high-risk","low-risk"} pregnancy and has Supervision of high risk pregnancy, antepartum; History of preterm delivery, currently pregnant; History of placental abruption; History of failed BTL with Filshie clips; Pyelonephritis affecting pregnancy in third trimester; and Insufficient prenatal care, no visits between 16-[redacted] weeks gestation on their problem list.  Patient reports {sx:14538}.   .  .   . Denies leaking of fluid.   The following portions of the patient's history were reviewed and updated as appropriate: allergies, current medications, past family history, past medical history, past social history, past surgical history and problem list.   Objective:  There were no vitals filed for this visit.  Fetal Status:           General:  Alert, oriented and cooperative. Patient is in no acute distress.  Skin: Skin is warm and dry. No rash noted.   Cardiovascular: Normal heart rate noted  Respiratory: Normal respiratory effort, no problems with respiration noted  Abdomen: Soft, gravid, appropriate for gestational age.        Pelvic: {Blank single:19197::"Cervical exam performed in the presence of a chaperone","Cervical exam deferred"}        Extremities: Normal range of motion.     Mental Status: Normal mood and affect. Normal behavior. Normal judgment and thought content.   Assessment and Plan:  Pregnancy: [id number] at [redacted]w[redacted]d 1. History of failed BTL with Filshie clips (Primary) ***  2. History of preterm delivery, currently pregnant ***  3. Insufficient prenatal care in third trimester ***  4. Supervision of high risk pregnancy, antepartum ***  5. History of placental abruption ***  {Blank single:19197::"Term","Preterm"} labor symptoms and general obstetric  precautions including but not limited to vaginal bleeding, contractions, leaking of fluid and fetal movement were reviewed in detail with the patient. Please refer to After Visit Summary for other counseling recommendations.   No follow-ups on file.  Future Appointments  Date Time Provider Department Center  12/14/2023  8:55 AM Federico Flake, MD [facility]  12/21/2023  8:55 AM [place] Bing, MD [facility]  12/22/2023  7:30 AM [facility] US5 [facility]  12/28/2023 11:15 AM Federico Flake, MD [facility]    Federico Flake, MD

## 2023-12-14 ENCOUNTER — Other Ambulatory Visit: Payer: Self-pay

## 2023-12-14 ENCOUNTER — Ambulatory Visit: Payer: Self-pay | Admitting: Family Medicine

## 2023-12-14 ENCOUNTER — Encounter (HOSPITAL_COMMUNITY): Payer: Self-pay | Admitting: Obstetrics and Gynecology

## 2023-12-14 ENCOUNTER — Inpatient Hospital Stay (EMERGENCY_DEPARTMENT_HOSPITAL)
Admission: AD | Admit: 2023-12-14 | Discharge: 2023-12-14 | Disposition: A | Discharge status: Home / Self Care | Source: Home / Self Care | Attending: Obstetrics and Gynecology | Admitting: Obstetrics and Gynecology

## 2023-12-14 VITALS — BP 100/68 | HR 83 | Wt 128.0 lb

## 2023-12-14 DIAGNOSIS — O0990 Supervision of high risk pregnancy, unspecified, unspecified trimester: Secondary | ICD-10-CM | POA: Diagnosis not present

## 2023-12-14 DIAGNOSIS — Z8759 Personal history of other complications of pregnancy, childbirth and the puerperium: Secondary | ICD-10-CM

## 2023-12-14 DIAGNOSIS — O0933 Supervision of pregnancy with insufficient antenatal care, third trimester: Secondary | ICD-10-CM

## 2023-12-14 DIAGNOSIS — O09899 Supervision of other high risk pregnancies, unspecified trimester: Secondary | ICD-10-CM | POA: Diagnosis not present

## 2023-12-14 DIAGNOSIS — Z3A37 37 weeks gestation of pregnancy: Secondary | ICD-10-CM | POA: Insufficient documentation

## 2023-12-14 DIAGNOSIS — O479 False labor, unspecified: Secondary | ICD-10-CM

## 2023-12-14 DIAGNOSIS — O471 False labor at or after 37 completed weeks of gestation: Secondary | ICD-10-CM | POA: Insufficient documentation

## 2023-12-14 DIAGNOSIS — Z9851 Tubal ligation status: Secondary | ICD-10-CM

## 2023-12-14 MED ORDER — LACTATED RINGERS IV BOLUS
1000.0000 mL | Freq: Once | INTRAVENOUS | Status: AC
  Administered 2023-12-14: 1000 mL via INTRAVENOUS

## 2023-12-14 MED ORDER — MORPHINE SULFATE (PF) 4 MG/ML IV SOLN
4.0000 mg | Freq: Once | INTRAVENOUS | Status: AC
  Administered 2023-12-14: 4 mg via INTRAVENOUS
  Filled 2023-12-14: qty 1

## 2023-12-14 MED ORDER — MORPHINE SULFATE (PF) 4 MG/ML IV SOLN
4.0000 mg | Freq: Once | INTRAVENOUS | Status: DC
  Filled 2023-12-14: qty 1

## 2023-12-14 MED ORDER — MORPHINE SULFATE (PF) 4 MG/ML IV SOLN: 4.0000 mg | Freq: Once | INTRAVENOUS | Status: DC

## 2023-12-14 NOTE — MAU Provider Note (Cosign Needed Addendum)
 None      S: Ms. Shelby Serrano is a 28 y.o. [id number] at [redacted]w[redacted]d  who presents to MAU today complaining of regular, painful contractions since 0500. She denies vaginal bleeding. She denies LOF. She reports normal fetal movement.    O: BP 102/63   Pulse 82   Temp 98.2 F (36.8 C) (Oral)   Resp 16   Ht 5\' 1"  (1.549 m)   Wt 58.1 kg   LMP 03/24/2023 (Approximate)   SpO2 100%   BMI 24.19 kg/m  GENERAL: Well-developed, well-nourished female in no acute distress.  HEAD: Normocephalic, atraumatic.  CHEST: Normal effort of breathing, regular heart rate ABDOMEN: Soft, nontender, gravid  Cervical exam:  Dilation: 4.5 Effacement (%): 50 Cervical Position: Posterior Station: -3 Exam by:: Arther Abbott RN   Fetal Monitoring: Baseline: 140 Variability: moderate Accelerations: present Decelerations: absent Contractions: every 3-24min   A/P: SIUP at [redacted]w[redacted]d here in likely early latent labor. She was 3-4cm in office today, has remained about 3-4 cm. NST reactive. Given Morphine for therapeutic rest and bolus x 2. She is concerned given hx of precipitous delivery. Given small incremental change, offered option for second dose of Morphine for therapeutic rest and bolus vs walking around. Pt desires to walk around. Will recheck cx in two hours from last check (~1830).   Sundra Aland, MD 12/14/2023 4:55 PM  ADDENDUM: 7:05 PM  Patient rechecked -- cx unchanged. Cat I strip. Will d/c at this time w return precautions.  Sundra Aland, MD OB Fellow, Faculty Practice [facility], Center for [facility]

## 2023-12-14 NOTE — MAU Note (Signed)
 Shelby Serrano is a 28 y.o. at [redacted]w[redacted]d here in MAU reporting: she's having regular ctxs since 0500 this morning, unsure how far apart ctxs are.  Denies VB or LOF.  Endorses +FM.  States was 4 cms at office appointment this morning.  LMP: 03/24/2023 Onset of complaint: today Pain score: 8 Vitals:   12/14/23 1214  BP: (!) 94/55  Pulse: 76  Resp: 18  Temp: 98.4 F (36.9 C)  SpO2: 100%     FHT: 135 bpm  Lab orders placed from triage: None

## 2023-12-15 ENCOUNTER — Encounter (HOSPITAL_COMMUNITY): Payer: Self-pay | Admitting: Obstetrics & Gynecology

## 2023-12-15 ENCOUNTER — Inpatient Hospital Stay (HOSPITAL_COMMUNITY)
Admission: AD | Admit: 2023-12-15 | Discharge: 2023-12-17 | DRG: 807 | Disposition: A | Discharge status: Home / Self Care | Payer: Self-pay | Attending: Family Medicine | Admitting: Family Medicine

## 2023-12-15 ENCOUNTER — Inpatient Hospital Stay (HOSPITAL_COMMUNITY): Admitting: Anesthesiology

## 2023-12-15 DIAGNOSIS — Z3A35 35 weeks gestation of pregnancy: Secondary | ICD-10-CM

## 2023-12-15 DIAGNOSIS — Z8249 Family history of ischemic heart disease and other diseases of the circulatory system: Secondary | ICD-10-CM | POA: Diagnosis not present

## 2023-12-15 DIAGNOSIS — Z7982 Long term (current) use of aspirin: Secondary | ICD-10-CM

## 2023-12-15 DIAGNOSIS — Z87891 Personal history of nicotine dependence: Secondary | ICD-10-CM

## 2023-12-15 DIAGNOSIS — M79662 Pain in left lower leg: Secondary | ICD-10-CM | POA: Diagnosis not present

## 2023-12-15 DIAGNOSIS — O26893 Other specified pregnancy related conditions, third trimester: Secondary | ICD-10-CM | POA: Diagnosis present

## 2023-12-15 DIAGNOSIS — O479 False labor, unspecified: Secondary | ICD-10-CM | POA: Diagnosis not present

## 2023-12-15 DIAGNOSIS — D573 Sickle-cell trait: Secondary | ICD-10-CM | POA: Diagnosis present

## 2023-12-15 DIAGNOSIS — M79606 Pain in leg, unspecified: Secondary | ICD-10-CM | POA: Diagnosis not present

## 2023-12-15 DIAGNOSIS — Z9851 Tubal ligation status: Secondary | ICD-10-CM

## 2023-12-15 DIAGNOSIS — O0990 Supervision of high risk pregnancy, unspecified, unspecified trimester: Secondary | ICD-10-CM

## 2023-12-15 DIAGNOSIS — O9882 Other maternal infectious and parasitic diseases complicating childbirth: Secondary | ICD-10-CM | POA: Diagnosis not present

## 2023-12-15 DIAGNOSIS — O9902 Anemia complicating childbirth: Principal | ICD-10-CM | POA: Diagnosis present

## 2023-12-15 DIAGNOSIS — O0930 Supervision of pregnancy with insufficient antenatal care, unspecified trimester: Secondary | ICD-10-CM

## 2023-12-15 DIAGNOSIS — Z3A37 37 weeks gestation of pregnancy: Secondary | ICD-10-CM | POA: Diagnosis not present

## 2023-12-15 DIAGNOSIS — Z833 Family history of diabetes mellitus: Secondary | ICD-10-CM

## 2023-12-15 DIAGNOSIS — M62838 Other muscle spasm: Secondary | ICD-10-CM | POA: Diagnosis not present

## 2023-12-15 DIAGNOSIS — O2303 Infections of kidney in pregnancy, third trimester: Secondary | ICD-10-CM | POA: Diagnosis present

## 2023-12-15 DIAGNOSIS — O0933 Supervision of pregnancy with insufficient antenatal care, third trimester: Secondary | ICD-10-CM | POA: Diagnosis not present

## 2023-12-15 DIAGNOSIS — O09899 Supervision of other high risk pregnancies, unspecified trimester: Secondary | ICD-10-CM

## 2023-12-15 DIAGNOSIS — O99891 Other specified diseases and conditions complicating pregnancy: Secondary | ICD-10-CM

## 2023-12-15 LAB — CBC
HCT: 31 % — ABNORMAL LOW (ref 36.0–46.0)
Hemoglobin: 10.6 g/dL — ABNORMAL LOW (ref 12.0–15.0)
MCH: 28.2 pg (ref 26.0–34.0)
MCHC: 34.2 g/dL (ref 30.0–36.0)
MCV: 82.4 fL (ref 80.0–100.0)
Platelets: 170 10*3/uL (ref 150–400)
RBC: 3.76 MIL/uL — ABNORMAL LOW (ref 3.87–5.11)
RDW: 14.3 % (ref 11.5–15.5)
WBC: 8.4 10*3/uL (ref 4.0–10.5)
nRBC: 0 % (ref 0.0–0.2)

## 2023-12-15 LAB — TYPE AND SCREEN
ABO/RH(D): O POS
Antibody Screen: NEGATIVE

## 2023-12-15 LAB — HIV ANTIBODY (ROUTINE TESTING W REFLEX): HIV Screen 4th Generation wRfx: NONREACTIVE

## 2023-12-15 LAB — RPR: RPR Ser Ql: NONREACTIVE

## 2023-12-15 MED ORDER — ACETAMINOPHEN 325 MG PO TABS
650.0000 mg | ORAL_TABLET | ORAL | Status: DC | PRN
  Administered 2023-12-15: 650 mg via ORAL
  Filled 2023-12-15: qty 2

## 2023-12-15 MED ORDER — OXYTOCIN BOLUS FROM INFUSION
333.0000 mL | Freq: Once | INTRAVENOUS | Status: DC
Start: 2023-12-15 — End: 2023-12-15

## 2023-12-15 MED ORDER — OXYCODONE-ACETAMINOPHEN 5-325 MG PO TABS: 2.0000 | ORAL_TABLET | ORAL | Status: DC | PRN

## 2023-12-15 MED ORDER — SODIUM CHLORIDE 0.9% FLUSH: 3.0000 mL | Freq: Two times a day (BID) | INTRAVENOUS | Status: DC

## 2023-12-15 MED ORDER — SOD CITRATE-CITRIC ACID 500-334 MG/5ML PO SOLN: 30.0000 mL | ORAL | Status: DC | PRN

## 2023-12-15 MED ORDER — OXYTOCIN-SODIUM CHLORIDE 30-0.9 UT/500ML-% IV SOLN
2.5000 [IU]/h | INTRAVENOUS | Status: DC
  Administered 2023-12-15: 2.5 [IU]/h via INTRAVENOUS

## 2023-12-15 MED ORDER — LIDOCAINE HCL (PF) 1 % IJ SOLN: 30.0000 mL | INTRAMUSCULAR | Status: DC | PRN

## 2023-12-15 MED ORDER — OXYCODONE HCL 5 MG PO TABS
5.0000 mg | ORAL_TABLET | ORAL | Status: AC
  Administered 2023-12-15: 5 mg via ORAL
  Filled 2023-12-15: qty 1

## 2023-12-15 MED ORDER — OXYTOCIN-SODIUM CHLORIDE 30-0.9 UT/500ML-% IV SOLN: 2.5000 [IU]/h | INTRAVENOUS | Status: DC

## 2023-12-15 MED ORDER — MEASLES, MUMPS & RUBELLA VAC IJ SOLR: 0.5000 mL | Freq: Once | INTRAMUSCULAR | Status: DC

## 2023-12-15 MED ORDER — DIPHENHYDRAMINE HCL 50 MG/ML IJ SOLN: 12.5000 mg | INTRAMUSCULAR | Status: DC | PRN

## 2023-12-15 MED ORDER — SENNOSIDES-DOCUSATE SODIUM 8.6-50 MG PO TABS
2.0000 | ORAL_TABLET | ORAL | Status: DC
  Administered 2023-12-15 – 2023-12-17 (×3): 2 via ORAL
  Filled 2023-12-15 (×3): qty 2

## 2023-12-15 MED ORDER — LIDOCAINE-EPINEPHRINE (PF) 1.5 %-1:200000 IJ SOLN
INTRAMUSCULAR | Status: DC | PRN
  Administered 2023-12-15: 5 mL via EPIDURAL

## 2023-12-15 MED ORDER — FENTANYL CITRATE (PF) 100 MCG/2ML IJ SOLN
INTRAMUSCULAR | Status: AC
  Filled 2023-12-15: qty 2

## 2023-12-15 MED ORDER — EPHEDRINE 5 MG/ML INJ: 10.0000 mg | INTRAVENOUS | Status: DC | PRN

## 2023-12-15 MED ORDER — ACETAMINOPHEN 325 MG PO TABS
650.0000 mg | ORAL_TABLET | ORAL | Status: DC | PRN
  Administered 2023-12-15 – 2023-12-16 (×3): 650 mg via ORAL
  Filled 2023-12-15 (×4): qty 2

## 2023-12-15 MED ORDER — SIMETHICONE 80 MG PO CHEW: 80.0000 mg | CHEWABLE_TABLET | ORAL | Status: DC | PRN

## 2023-12-15 MED ORDER — PHENYLEPHRINE 80 MCG/ML (10ML) SYRINGE FOR IV PUSH (FOR BLOOD PRESSURE SUPPORT): 80.0000 ug | PREFILLED_SYRINGE | INTRAVENOUS | Status: DC | PRN

## 2023-12-15 MED ORDER — OXYTOCIN BOLUS FROM INFUSION
333.0000 mL | Freq: Once | INTRAVENOUS | Status: AC
  Administered 2023-12-15: 333 mL via INTRAVENOUS

## 2023-12-15 MED ORDER — OXYCODONE-ACETAMINOPHEN 5-325 MG PO TABS: 1.0000 | ORAL_TABLET | ORAL | Status: DC | PRN

## 2023-12-15 MED ORDER — LACTATED RINGERS IV SOLN
500.0000 mL | Freq: Once | INTRAVENOUS | Status: AC
  Administered 2023-12-15: 500 mL via INTRAVENOUS

## 2023-12-15 MED ORDER — IBUPROFEN 600 MG PO TABS
600.0000 mg | ORAL_TABLET | Freq: Four times a day (QID) | ORAL | Status: DC
  Administered 2023-12-16 – 2023-12-17 (×7): 600 mg via ORAL
  Filled 2023-12-15 (×6): qty 1

## 2023-12-15 MED ORDER — FENTANYL CITRATE (PF) 100 MCG/2ML IJ SOLN
100.0000 ug | Freq: Once | INTRAMUSCULAR | Status: AC
  Administered 2023-12-15: 100 ug via INTRAVENOUS

## 2023-12-15 MED ORDER — FENTANYL-BUPIVACAINE-NACL 0.5-0.125-0.9 MG/250ML-% EP SOLN
12.0000 mL/h | EPIDURAL | Status: DC | PRN
  Administered 2023-12-15: 12 mL/h via EPIDURAL

## 2023-12-15 MED ORDER — LACTATED RINGERS IV SOLN: INTRAVENOUS | Status: DC

## 2023-12-15 MED ORDER — ACETAMINOPHEN 325 MG PO TABS: 650.0000 mg | ORAL_TABLET | ORAL | Status: DC | PRN

## 2023-12-15 MED ORDER — PRENATAL MULTIVITAMIN CH
1.0000 | ORAL_TABLET | Freq: Every day | ORAL | Status: DC
  Administered 2023-12-15 – 2023-12-17 (×3): 1 via ORAL
  Filled 2023-12-15 (×3): qty 1

## 2023-12-15 MED ORDER — LACTATED RINGERS IV SOLN: 500.0000 mL | INTRAVENOUS | Status: DC | PRN

## 2023-12-15 MED ORDER — SODIUM CHLORIDE 0.9 % IV SOLN: 250.0000 mL | INTRAVENOUS | Status: DC | PRN

## 2023-12-15 MED ORDER — OXYCODONE HCL 5 MG PO TABS
10.0000 mg | ORAL_TABLET | ORAL | Status: DC | PRN
  Administered 2023-12-16 – 2023-12-17 (×5): 10 mg via ORAL
  Filled 2023-12-15 (×5): qty 2

## 2023-12-15 MED ORDER — METHYLERGONOVINE MALEATE 0.2 MG/ML IJ SOLN
INTRAMUSCULAR | Status: AC
  Filled 2023-12-15: qty 1

## 2023-12-15 MED ORDER — TETANUS-DIPHTH-ACELL PERTUSSIS 5-2.5-18.5 LF-MCG/0.5 IM SUSY: 0.5000 mL | PREFILLED_SYRINGE | Freq: Once | INTRAMUSCULAR | Status: DC

## 2023-12-15 MED ORDER — COCONUT OIL OIL: 1.0000 | TOPICAL_OIL | Status: DC | PRN

## 2023-12-15 MED ORDER — FENTANYL-BUPIVACAINE-NACL 0.5-0.125-0.9 MG/250ML-% EP SOLN
EPIDURAL | Status: AC
  Filled 2023-12-15: qty 250

## 2023-12-15 MED ORDER — METHYLERGONOVINE MALEATE 0.2 MG PO TABS
0.2000 mg | ORAL_TABLET | ORAL | Status: AC
  Administered 2023-12-15 – 2023-12-16 (×6): 0.2 mg via ORAL
  Filled 2023-12-15 (×6): qty 1

## 2023-12-15 MED ORDER — LACTATED RINGERS IV SOLN
500.0000 mL | INTRAVENOUS | Status: DC | PRN
  Administered 2023-12-15: 500 mL via INTRAVENOUS

## 2023-12-15 MED ORDER — CEFAZOLIN SODIUM-DEXTROSE 2-4 GM/100ML-% IV SOLN
2.0000 g | INTRAVENOUS | Status: AC
  Administered 2023-12-15: 2 g via INTRAVENOUS
  Filled 2023-12-15: qty 100

## 2023-12-15 MED ORDER — ONDANSETRON HCL 4 MG/2ML IJ SOLN
4.0000 mg | Freq: Four times a day (QID) | INTRAMUSCULAR | Status: DC | PRN
  Administered 2023-12-15: 4 mg via INTRAVENOUS
  Filled 2023-12-15: qty 2

## 2023-12-15 MED ORDER — SODIUM CHLORIDE 0.9% FLUSH: 3.0000 mL | INTRAVENOUS | Status: DC | PRN

## 2023-12-15 MED ORDER — ZOLPIDEM TARTRATE 5 MG PO TABS: 5.0000 mg | ORAL_TABLET | Freq: Every evening | ORAL | Status: DC | PRN

## 2023-12-15 MED ORDER — KETOROLAC TROMETHAMINE 30 MG/ML IJ SOLN
15.0000 mg | Freq: Four times a day (QID) | INTRAMUSCULAR | Status: AC
  Filled 2023-12-15: qty 1

## 2023-12-15 MED ORDER — OXYCODONE HCL 5 MG PO TABS
5.0000 mg | ORAL_TABLET | ORAL | Status: DC | PRN
  Administered 2023-12-15: 5 mg via ORAL
  Filled 2023-12-15 (×2): qty 1

## 2023-12-15 MED ORDER — ONDANSETRON HCL 4 MG/2ML IJ SOLN: 4.0000 mg | INTRAMUSCULAR | Status: DC | PRN

## 2023-12-15 MED ORDER — BENZOCAINE-MENTHOL 20-0.5 % EX AERO
1.0000 | INHALATION_SPRAY | CUTANEOUS | Status: DC | PRN
  Filled 2023-12-15: qty 56

## 2023-12-15 MED ORDER — OXYTOCIN-SODIUM CHLORIDE 30-0.9 UT/500ML-% IV SOLN
INTRAVENOUS | Status: AC
  Filled 2023-12-15: qty 500

## 2023-12-15 MED ORDER — WITCH HAZEL-GLYCERIN EX PADS: 1.0000 | MEDICATED_PAD | CUTANEOUS | Status: DC | PRN

## 2023-12-15 MED ORDER — ONDANSETRON HCL 4 MG PO TABS: 4.0000 mg | ORAL_TABLET | ORAL | Status: DC | PRN

## 2023-12-15 MED ORDER — ONDANSETRON HCL 4 MG/2ML IJ SOLN: 4.0000 mg | Freq: Four times a day (QID) | INTRAMUSCULAR | Status: DC | PRN

## 2023-12-15 MED ORDER — TRANEXAMIC ACID-NACL 1000-0.7 MG/100ML-% IV SOLN
INTRAVENOUS | Status: AC
  Filled 2023-12-15: qty 100

## 2023-12-15 MED ORDER — DIPHENHYDRAMINE HCL 25 MG PO CAPS: 25.0000 mg | ORAL_CAPSULE | Freq: Four times a day (QID) | ORAL | Status: DC | PRN

## 2023-12-15 MED ORDER — PHENYLEPHRINE 80 MCG/ML (10ML) SYRINGE FOR IV PUSH (FOR BLOOD PRESSURE SUPPORT)
80.0000 ug | PREFILLED_SYRINGE | INTRAVENOUS | Status: DC | PRN
  Administered 2023-12-15 (×2): 80 ug via INTRAVENOUS
  Filled 2023-12-15: qty 10

## 2023-12-15 MED ORDER — DIBUCAINE (PERIANAL) 1 % EX OINT: 1.0000 | TOPICAL_OINTMENT | CUTANEOUS | Status: DC | PRN

## 2023-12-15 MED ORDER — LACTATED RINGERS IV BOLUS
1000.0000 mL | Freq: Once | INTRAVENOUS | Status: AC
  Administered 2023-12-15: 1000 mL via INTRAVENOUS

## 2023-12-15 MED ORDER — FLEET ENEMA RE ENEM: 1.0000 | ENEMA | RECTAL | Status: DC | PRN

## 2023-12-15 MED ORDER — IBUPROFEN 600 MG PO TABS
600.0000 mg | ORAL_TABLET | Freq: Four times a day (QID) | ORAL | Status: DC
  Administered 2023-12-15 (×2): 600 mg via ORAL
  Filled 2023-12-15 (×3): qty 1

## 2023-12-15 MED ORDER — FLEET ENEMA RE ENEM: 1.0000 | ENEMA | Freq: Every day | RECTAL | Status: DC | PRN

## 2023-12-15 MED ORDER — TRANEXAMIC ACID-NACL 1000-0.7 MG/100ML-% IV SOLN
1000.0000 mg | INTRAVENOUS | Status: AC
Start: 2023-12-15 — End: 2023-12-15
  Administered 2023-12-15: 1000 mg via INTRAVENOUS

## 2023-12-15 MED ORDER — METHYLERGONOVINE MALEATE 0.2 MG/ML IJ SOLN
0.2000 mg | Freq: Once | INTRAMUSCULAR | Status: AC
  Administered 2023-12-15: 0.2 mg via INTRAMUSCULAR

## 2023-12-15 NOTE — MAU Note (Signed)
.  Shelby Serrano is a 28 y.o. at [redacted]w[redacted]d here in MAU reporting: Worsening CTX's since being sent home from MAU yesterday. Cervix was 4.5 cm. Patient received two LR boluses and therapeutic rest. Patient presented with an urge to push. [place], CNM, notified and at patient's bedside immediately.  GBS-. Resolved previa. H/o 2022 24wk abruption, PTL/PTB, NN demise. H/o 2021 failed filshie   5.580-2  Onset of complaint: Today Pain score: 10/10 lower abdomen  FHT: 135 initial external Lab orders placed from triage: MAU Labor Eval

## 2023-12-15 NOTE — Progress Notes (Signed)
 Patient ID: Shelby Serrano, female   DOB: 1996-02-28, 28 y.o.   MRN: [id number] CTBS for additional bleeding. VSS. Uterus swept. 75 CC clot removed. No retained POCs identified. FF U/2. Scant bleeding. Foley cath replaced. Plan Shelby Serrano if bleeding continues. Updated Dr. Alvester Morin.   [place], CNM 12/15/2023 11:46 AM

## 2023-12-15 NOTE — Anesthesia Preprocedure Evaluation (Signed)
 Anesthesia Evaluation  Patient identified by MRN, date of birth, ID band Patient awake    Reviewed: Allergy & Precautions, NPO status , Patient's Chart, lab work & pertinent test results  Airway Mallampati: II  TM Distance: >3 FB Neck ROM: Full    Dental   Pulmonary neg pulmonary ROS, former smoker   Pulmonary exam normal        Cardiovascular negative cardio ROS  Rhythm:Regular Rate:Normal     Neuro/Psych   Anxiety        GI/Hepatic negative GI ROS, Neg liver ROS,,,  Endo/Other  negative endocrine ROS    Renal/GU   negative genitourinary   Musculoskeletal negative musculoskeletal ROS (+)    Abdominal Normal abdominal exam  (+)   Peds  Hematology  (+) Blood dyscrasia, anemia Lab Results      Component                Value               Date                      WBC                      8.4                 12/15/2023                HGB                      10.6 (L)            12/15/2023                HCT                      31.0 (L)            12/15/2023                MCV                      82.4                12/15/2023                PLT                      170                 12/15/2023              Anesthesia Other Findings   Reproductive/Obstetrics (+) Pregnancy                             Anesthesia Physical Anesthesia Plan  ASA: 2  Anesthesia Plan: Epidural   Post-op Pain Management:    Induction: Intravenous  PONV Risk Score and Plan: 2 and Treatment may vary due to age or medical condition  Airway Management Planned: Natural Airway  Additional Equipment: None  Intra-op Plan:   Post-operative Plan:   Informed Consent: I have reviewed the patients History and Physical, chart, labs and discussed the procedure including the risks, benefits and alternatives for the proposed anesthesia with the patient or authorized representative who has indicated his/her  understanding and acceptance.     Dental advisory  given  Plan Discussed with:   Anesthesia Plan Comments:        Anesthesia Quick Evaluation

## 2023-12-15 NOTE — MAU Note (Signed)
 Called Blood Bank to request further confirmation regarding type and screen that was drawn from yesterday if they would be able to use the same sample as she is wearing her blood bank bracelet.   Shelby Serrano in blood bank stated the patient did not need a new type and screen drawn and to keep her current arm band on.

## 2023-12-15 NOTE — H&P (Addendum)
 OBSTETRIC ADMISSION HISTORY AND PHYSICAL  Shelby Serrano is a 28 y.o. female [phone] with IUP at [redacted]w[redacted]d by [place] presenting for SOL. She reports +FMs, No LOF, no VB, no blurry vision, headaches or peripheral edema, and RUQ pain.  She plans on formula feeding. She is unsure about contraception. She received her prenatal care at [facility].   Dating: By [place] --->  Estimated Date of Delivery: 01/03/24  Sono:    @[redacted]w[redacted]d , CWD, normal anatomy, cephalic presentation, 2190 g, 10% EFW   Prenatal History/Complications:  #Hx preterm delivery #Hx placental abruption  #Hx failed bilateral tubal with filshie clips  #Pyelonephritis affecting pregnancy third trimester  #Insufficient prenatal care   Past Medical History: Past Medical History:  Diagnosis Date   Acute chorioamnionitis 01/13/2021   Noted on placental pathology        Anemia    Anxiety    Failed BTL (Filshie) 09/28/2020   02/2020     Grand multiparity 10/08/2020   Heart murmur    when she was younger   Left sided abdominal pain 01/12/2021   Low serum vitamin D 10/09/2020   [ ]  recheck at 28wks     Preterm delivery 01/10/2021   Preterm delivery at [redacted]w[redacted]d     Pyelonephritis affecting pregnancy    Sickle cell trait (HCC)    Supervision of high risk pregnancy, antepartum 01/10/2021   Supervision of other normal pregnancy, antepartum 03/10/2016              Nursing Staff    Provider      Office Location    [facility]    Dating      6wk u/s      Language     English    Anatomy US            Flu Vaccine          Genetic Screen     NIPS:  Low risk           TDaP Vaccine          Hgb A1C or   GTT    Early   Third trimester       COVID Vaccine              LAB RESULTS       Rhogam     NA    Blood Type    --/--/O POS, O POS  Performed at [facility]   Vaginal bleeding affecting early pregnancy 10/08/2020    Past Surgical History: Past Surgical History:  Procedure Laterality Date   TUBAL LIGATION Bilateral 02/18/2020   Procedure: POST  PARTUM TUBAL LIGATION;  Surgeon: Society Hill Bing, MD;  Location: [facility] LD ORS;  Service: Gynecology;  Laterality: Bilateral;    Obstetrical History: OB History     Gravida  9   Para  5   Term  4   Preterm  1   AB  3   Living  4      SAB  3   IAB  0   Ectopic  0   Multiple  0   Live Births  5           Social History Social History   Socioeconomic History   Marital status: Single    Spouse name: Not on file   Number of children: Not on file   Years of education: Not on file   Highest education level: Not on file  Occupational History  Not on file  Tobacco Use   Smoking status: Former    Current packs/day: 0.00    Types: Cigarettes    Quit date: 02/08/2016    Years since quitting: 7.8   Smokeless tobacco: Never  Vaping Use   Vaping status: Never Used  Substance and Sexual Activity   Alcohol use: Not Currently   Drug use: Not Currently    Types: Marijuana    Comment: Patient denies, Positive UDS 05/04/16 [facility]   Sexual activity: Yes  Other Topics Concern   Not on file  Social History Narrative   Not on file   Social Drivers of Health   Financial Resource Strain: Low Risk  (10/28/2023)   Overall Financial Resource Strain (CARDIA)    Difficulty of Paying Living Expenses: Not hard at all  Food Insecurity: No Food Insecurity (12/15/2023)   Hunger Vital Sign    Worried About Running Out of Food in the Last Year: Never true    Ran Out of Food in the Last Year: Never true  Transportation Needs: No Transportation Needs (12/15/2023)   PRAPARE - Administrator, Civil Service (Medical): No    Lack of Transportation (Non-Medical): No  Physical Activity: Sufficiently Active (10/28/2023)   Exercise Vital Sign    Days of Exercise per Week: 5 days    Minutes of Exercise per Session: 60 min  Stress: No Stress Concern Present (10/28/2023)   [employer] of Occupational Health - Occupational Stress Questionnaire    Feeling of Stress : Not at all   Social Connections: Moderately Integrated (10/28/2023)   Social Connection and Isolation Panel [NHANES]    Frequency of Communication with Friends and Family: Once a week    Frequency of Social Gatherings with Friends and Family: Once a week    Attends Religious Services: More than 4 times per year    Active Member of [employer] or Organizations: Yes    Attends Engineer, structural: More than 4 times per year    Marital Status: Living with partner    Family History: Family History  Problem Relation Age of Onset   Diabetes Mother    Hypertension Mother    Healthy Father        unaware of any problems    Allergies: No Known Allergies  Medications Prior to Admission  Medication Sig Dispense Refill Last Dose/Taking   aspirin EC 81 MG tablet Take 1 tablet (81 mg total) by mouth daily. Swallow whole. 30 tablet 12 12/13/2023   cyclobenzaprine (FLEXERIL) 10 MG tablet Take 1 tablet (10 mg total) by mouth every 8 (eight) hours as needed for muscle spasms. 30 tablet 1 Past Week   Prenatal Vit-Fe Fumarate-FA (PREPLUS) 27-1 MG TABS Take 1 tablet by mouth daily. 30 tablet 11 12/13/2023   Blood Pressure Monitoring (BLOOD PRESSURE KIT) DEVI 1 each by Does not apply route as needed. 1 each 0      Review of Systems   All systems reviewed and negative except as stated in HPI  Blood pressure 106/66, pulse 76, temperature 97.9 F (36.6 C), temperature source Oral, resp. rate 18, last menstrual period 03/24/2023, SpO2 100%, unknown if currently breastfeeding. General appearance: alert, cooperative, appears stated age, and moderate distress Lungs: clear to auscultation bilaterally Heart: regular rate and rhythm Abdomen: soft, non-tender; bowel sounds normal Pelvic: see cervical exam Extremities: Homans sign is negative, no sign of DVT Presentation: cephalic Fetal monitoringBaseline: 130  bpm; moderate variability; no decelerations Uterine activityFrequency: Every  7 minutes Dilation:  8 Effacement (%): 80 Station: -1 Exam by:: Johnathan Hausen RN   Prenatal labs: ABO, Rh: --/--/PENDING (04/03 0739) Antibody: PENDING (04/03 0739) Rubella: 2.39 (10/09 1024) RPR: Non Reactive (02/12 0923)  HBsAg: Negative (10/09 1024)  HIV: Non Reactive (02/12 0923)  GBS: Negative/-- (03/26 1320)    Lab Results  Component Value Date   GBS Negative 12/07/2023   GTT WNL Genetic screening  LR Female Anatomy US WNL  Immunization History  Administered Date(s) Administered   Influenza, Seasonal, Injecte, Preservative Fre 07/20/2023   Influenza,inj,Quad PF,6+ Mos 05/18/2016, 05/31/2018   Influenza-Unspecified 05/18/2016, 05/31/2018   Tdap 06/01/2016, 05/31/2018, 12/20/2019, 01/12/2021, 10/28/2023    Prenatal Transfer Tool  Maternal Diabetes: No Genetic Screening: Normal Maternal Ultrasounds/Referrals: Normal Fetal Ultrasounds or other Referrals:  Referred to Materal Fetal Medicine  Maternal Substance Abuse:  No Significant Maternal Medications:  None Significant Maternal Lab Results: Group B Strep negative Number of Prenatal Visits:greater than 3 verified prenatal visits Maternal Vaccinations:TDap Other Comments:  None   Results for orders placed or performed during the hospital encounter of 12/15/23 (from the past 24 hours)  CBC   Collection Time: 12/15/23  7:39 AM  Result Value Ref Range   WBC 8.4 4.0 - 10.5 K/uL   RBC 3.76 (L) 3.87 - 5.11 MIL/uL   Hemoglobin 10.6 (L) 12.0 - 15.0 g/dL   HCT 47.8 (L) 29.5 - 62.1 %   MCV 82.4 80.0 - 100.0 fL   MCH 28.2 26.0 - 34.0 pg   MCHC 34.2 30.0 - 36.0 g/dL   RDW 30.8 65.7 - 84.6 %   Platelets 170 150 - 400 K/uL   nRBC 0.0 0.0 - 0.2 %  Type and screen [facility]   Collection Time: 12/15/23  7:39 AM  Result Value Ref Range   ABO/RH(D) PENDING    Antibody Screen PENDING    Sample Expiration      12/18/2023,2359 Performed at [facility], [address], [place], [place] [address]     Patient Active  Problem List   Diagnosis Date Noted   Normal labor 12/15/2023   Insufficient prenatal care, no visits between 16-[redacted] weeks gestation 10/28/2023   Pyelonephritis affecting pregnancy in third trimester 10/18/2023   History of failed BTL with Filshie clips 07/07/2023   History of placental abruption 06/30/2023   History of preterm delivery, currently pregnant 06/22/2023   Supervision of high risk pregnancy, antepartum 06/14/2023    Assessment/Plan:  Shelby Serrano is a 28 y.o. [id number] at [redacted]w[redacted]d here for SOL.   #Labor:Active labor. Expectant management.  #Pain: Pt uncomfortable. Reports feeling better with epidural placed.  #FWB: Cat 1 #GBS status:  negative #Feeding: Formula #Reproductive Life planning: Depo Provera #Circ:  not applicable  Denton Ar, MD  12/15/2023, 8:48 AM  I was present for the exam and agree with above.  Katrinka Blazing, [place], CNM 12/15/2023 9:10 PM

## 2023-12-15 NOTE — Anesthesia Procedure Notes (Signed)
 Epidural Patient location during procedure: OB Start time: 12/15/2023 8:00 AM End time: 12/15/2023 8:10 AM  Staffing Anesthesiologist: Atilano Median, DO Performed: anesthesiologist   Preanesthetic Checklist Completed: patient identified, IV checked, site marked, risks and benefits discussed, surgical consent, monitors and equipment checked, pre-op evaluation and timeout performed  Epidural Patient position: sitting Prep: ChloraPrep Patient monitoring: heart rate, continuous pulse ox and blood pressure Approach: midline Location: L3-L4 Injection technique: LOR saline  Needle:  Needle type: Tuohy  Needle gauge: 17 G Needle length: 9 cm Needle insertion depth: 5.5 cm Catheter type: closed end flexible Catheter size: 20 Guage Catheter at skin depth: 11 cm Test dose: negative and 1.5% lidocaine with Epi 1:200 K  Assessment Events: blood not aspirated, no cerebrospinal fluid, injection not painful, no injection resistance and no paresthesia  Additional Notes Patient identified. Risks/Benefits/Options discussed with patient including but not limited to bleeding, infection, nerve damage, paralysis, failed block, incomplete pain control, headache, blood pressure changes, nausea, vomiting, reactions to medications, itching and postpartum back pain. Confirmed with bedside nurse the patient's most recent platelet count. Confirmed with patient that they are not currently taking any anticoagulation, have any bleeding history or any family history of bleeding disorders. Patient expressed understanding and wished to proceed. All questions were answered. Sterile technique was used throughout the entire procedure. Please see nursing notes for vital signs. Test dose was given through epidural catheter and negative prior to continuing to dose epidural or start infusion. Warning signs of high block given to the patient including shortness of breath, tingling/numbness in hands, complete motor block,  or any concerning symptoms with instructions to call for help. Patient was given instructions on fall risk and not to get out of bed. All questions and concerns addressed with instructions to call with any issues or inadequate analgesia.    Reason for block:procedure for pain

## 2023-12-15 NOTE — Discharge Summary (Addendum)
 Postpartum Discharge Summary  Date of Service updated 12/17/2023     Patient Name: Shelby Serrano DOB: 12-21-1995 MRN: [id number]  Date of admission: 12/15/2023 Delivery date:12/15/2023 Delivering provider: Dorathy Kinsman Date of discharge: 12/17/2023  Admitting diagnosis: Normal labor [O80, Z37.9] Intrauterine pregnancy: [redacted]w[redacted]d     Secondary diagnosis:  Active Problems:   History of failed BTL with Filshie clips   Pyelonephritis affecting pregnancy in third trimester   Insufficient prenatal care, no visits between 16-[redacted] weeks gestation   NSVD (normal spontaneous vaginal delivery)  Additional problems: none    Discharge diagnosis: Term Pregnancy Delivered                                              Post partum procedures: none Augmentation: AROM Complications: None  Hospital course: Onset of Labor With Vaginal Delivery      28 y.o. yo [phone] at 104w2d was admitted in Active Labor on 12/15/2023. Labor course was uncomplicated.  Membrane Rupture Time/Date: 10:33 AM,12/15/2023  Delivery Method:Vaginal, Spontaneous Operative Delivery:N/A Episiotomy: None Lacerations:  None Patient had a postpartum course complicated by generalized muscle soreness and spasm of paraspinal muscles; improved with flexeril and heat.  She is ambulating, tolerating a regular diet, passing flatus, and urinating well. Patient is discharged home in stable condition on 12/17/23.  Newborn Data: Birth date:12/15/2023 Birth time:10:52 AM Gender:Female Living status:Living Apgars:8 ,9  Weight:2720 g  Magnesium Sulfate received: No BMZ received: No Rhophylac:N/A MMR:Yes T-DaP:Given prenatally Flu: Yes RSV Vaccine received: No Transfusion:No  Immunizations received: Immunization History  Administered Date(s) Administered   Influenza, Seasonal, Injecte, Preservative Fre 07/20/2023   Influenza,inj,Quad PF,6+ Mos 05/18/2016, 05/31/2018   Influenza-Unspecified 05/18/2016, 05/31/2018   Tdap 06/01/2016,  05/31/2018, 12/20/2019, 01/12/2021, 10/28/2023    Physical exam  Vitals:   12/16/23 0312 12/16/23 1234 12/16/23 2030 12/17/23 0526  BP: 105/74 111/68 107/76 114/67  Pulse: 67 63 (!) 57 65  Resp: 18 18 18 18   Temp:  97.9 F (36.6 C) 97.9 F (36.6 C) 98 F (36.7 C)  TempSrc:  Axillary Oral Oral  SpO2: 100% 97%  99%  Weight:      Height:       General: alert, cooperative, and no distress Lochia: appropriate Uterine Fundus: firm Incision: N/A DVT Evaluation: No evidence of DVT seen on physical exam. Labs: Lab Results  Component Value Date   WBC 12.2 (H) 12/16/2023   HGB 10.9 (L) 12/16/2023   HCT 31.7 (L) 12/16/2023   MCV 81.7 12/16/2023   PLT 171 12/16/2023      Latest Ref Rng & Units 10/28/2023    7:29 PM  CMP  Glucose 70 - 99 mg/dL 76   BUN 6 - 20 mg/dL 5   Creatinine 1.47 - 8.29 mg/dL 5.62   Sodium 130 - 865 mmol/L 134   Potassium 3.5 - 5.1 mmol/L 4.0   Chloride 98 - 111 mmol/L 104   CO2 22 - 32 mmol/L 23   Calcium 8.9 - 10.3 mg/dL 9.0   Total Protein 6.5 - 8.1 g/dL 6.3   Total Bilirubin 0.0 - 1.2 mg/dL 0.7   Alkaline Phos 38 - 126 U/L 118   AST 15 - 41 U/L 26   ALT 0 - 44 U/L 26    Edinburgh Score:    12/15/2023    2:10 PM  Edinburgh Postnatal Depression Scale Screening  Tool  I have been able to laugh and see the funny side of things. 0  I have looked forward with enjoyment to things. 0  I have blamed myself unnecessarily when things went wrong. 0  I have been anxious or worried for no good reason. 0  I have felt scared or panicky for no good reason. 0  Things have been getting on top of me. 1  I have been so unhappy that I have had difficulty sleeping. 0  I have felt sad or miserable. 0  I have been so unhappy that I have been crying. 0  The thought of harming myself has occurred to me. 0  Edinburgh Postnatal Depression Scale Total 1   No data recorded   After visit meds:  Allergies as of 12/17/2023   No Known Allergies      Medication List      STOP taking these medications    aspirin EC 81 MG tablet       TAKE these medications    acetaminophen 325 MG tablet Commonly known as: Tylenol Take 2 tablets (650 mg total) by mouth every 4 (four) hours as needed (for pain scale < 4).   Blood Pressure Kit Devi 1 each by Does not apply route as needed.   cyclobenzaprine 10 MG tablet Commonly known as: FLEXERIL Take 1 tablet (10 mg total) by mouth every 8 (eight) hours as needed for muscle spasms.   ibuprofen 600 MG tablet Commonly known as: ADVIL Take 1 tablet (600 mg total) by mouth every 6 (six) hours.   PrePLUS 27-1 MG Tabs Take 1 tablet by mouth daily.          Discharge home in stable condition Infant Feeding: Bottle Infant Disposition:home with mother Discharge instruction: per After Visit Summary and Postpartum booklet. Activity: Advance as tolerated. Pelvic rest for 6 weeks.  Diet: routine diet Future Appointments: Future Appointments  Date Time Provider Department Center  12/30/2023  8:15 AM [facility]  01/26/2024  9:15 AM Lorriane Shire, MD [facility]   Follow up Visit:  Follow-up Information     Center for [facility]. Schedule an appointment as soon as possible for a visit.   Specialty: Obstetrics and Gynecology Why: Routine postpartum follow up in 4-6 weeks Contact information: [address] [place] [address] [phone]               Message sent to [facility] 4/3  Please schedule this patient for a In person postpartum visit in 4 weeks with the following provider: Any provider. Additional Postpartum F/U:Postpartum Depression checkup  High risk pregnancy complicated by:  Hx of 24 wk IUFD Delivery mode:  Vaginal, Spontaneous Anticipated Birth Control:  Unsure   12/17/2023 Wyn Forster, MD FMOB Fellow, Faculty practice [facility], Center for [facility]

## 2023-12-16 ENCOUNTER — Other Ambulatory Visit (HOSPITAL_COMMUNITY): Payer: Self-pay

## 2023-12-16 ENCOUNTER — Inpatient Hospital Stay (HOSPITAL_COMMUNITY)

## 2023-12-16 DIAGNOSIS — M79606 Pain in leg, unspecified: Secondary | ICD-10-CM | POA: Diagnosis not present

## 2023-12-16 LAB — CBC
HCT: 31.7 % — ABNORMAL LOW (ref 36.0–46.0)
Hemoglobin: 10.9 g/dL — ABNORMAL LOW (ref 12.0–15.0)
MCH: 28.1 pg (ref 26.0–34.0)
MCHC: 34.4 g/dL (ref 30.0–36.0)
MCV: 81.7 fL (ref 80.0–100.0)
Platelets: 171 10*3/uL (ref 150–400)
RBC: 3.88 MIL/uL (ref 3.87–5.11)
RDW: 14 % (ref 11.5–15.5)
WBC: 12.2 10*3/uL — ABNORMAL HIGH (ref 4.0–10.5)
nRBC: 0 % (ref 0.0–0.2)

## 2023-12-16 MED ORDER — GABAPENTIN 300 MG PO CAPS
300.0000 mg | ORAL_CAPSULE | Freq: Three times a day (TID) | ORAL | Status: DC
  Administered 2023-12-16 – 2023-12-17 (×3): 300 mg via ORAL
  Filled 2023-12-16 (×3): qty 1

## 2023-12-16 MED ORDER — MEDROXYPROGESTERONE ACETATE 150 MG/ML IM SUSP
150.0000 mg | Freq: Once | INTRAMUSCULAR | Status: AC
  Administered 2023-12-17: 150 mg via INTRAMUSCULAR
  Filled 2023-12-16: qty 1

## 2023-12-16 MED ORDER — CYCLOBENZAPRINE HCL 5 MG PO TABS
10.0000 mg | ORAL_TABLET | Freq: Once | ORAL | Status: AC
  Administered 2023-12-16: 10 mg via ORAL
  Filled 2023-12-16: qty 2

## 2023-12-16 MED ORDER — IBUPROFEN 600 MG PO TABS
600.0000 mg | ORAL_TABLET | Freq: Four times a day (QID) | ORAL | 0 refills | Status: DC
  Filled 2023-12-16: qty 30, 8d supply, fill #0

## 2023-12-16 MED ORDER — ACETAMINOPHEN 325 MG PO TABS
650.0000 mg | ORAL_TABLET | ORAL | 0 refills | Status: AC | PRN
  Filled 2023-12-16: qty 30, 3d supply, fill #0

## 2023-12-16 NOTE — Progress Notes (Signed)
 POSTPARTUM PROGRESS NOTE  Post Partum Day : 1  Subjective:  Shelby Serrano is a 28 y.o. [phone] s/p SVD at [redacted]w[redacted]d.  She reports she is doing well. No acute events overnight. She denies any problems with ambulating, voiding or po intake. Denies nausea or vomiting.  Pain is well controlled.  Lochia is normal.  Pt does have some persistent left calf pain  Objective: Blood pressure 105/74, pulse 67, temperature 98 F (36.7 C), temperature source Axillary, resp. rate 18, height 5\' 1"  (1.549 m), weight 58.1 kg, last menstrual period 03/24/2023, SpO2 100%, unknown if currently breastfeeding.  Physical Exam:  General: alert, cooperative and no distress Chest: no respiratory distress Heart:regular rate, distal pulses intact Abdomen: soft, nontender, nondistended Uterine Fundus: firm, appropriately tender DVT Evaluation: No calf swelling or but persistent left calf tenderness.  No erythema noted Extremities: trace edema Skin: warm, dry  Recent Labs    12/15/23 0739 12/16/23 0505  HGB 10.6* 10.9*  HCT 31.0* 31.7*    Assessment/Plan: Shelby Serrano is a 28 y.o. [phone] s/p SVD at [redacted]w[redacted]d   PPD#1 - Doing well  Routine postpartum care Will get calf dopplers to rule out DVT Contraception: depo provera Feeding: bottle Dispo: Plan for discharge on 12/17/23 if dopplers are cleared.   LOS: 1 day   Mariel Aloe, MD Faculty attending 12/16/2023, 9:55 AM

## 2023-12-16 NOTE — Social Work (Signed)
 MOB was referred for history of depression/anxiety.  * Referral screened out by Clinical Social Worker because none of the following criteria appear to apply:  ~ History of anxiety/depression during this pregnancy, or of post-partum depression following prior delivery.  ~ Diagnosis of anxiety and/or depression within last 3 years OR * MOB's symptoms currently being treated with medication and/or therapy.  Per chart review MOB was diagnosed prior to April 2022, per [facility] records no MH concerns noted during this pregnancy. Edinburgh =1  Please contact the Clinical Social Worker if needs arise, or by MOB request.   Wende Neighbors, LCSWA Clinical Social Worker [phone]

## 2023-12-16 NOTE — Progress Notes (Signed)
 BLE venous duplex has been completed.  Results can be found under chart review under CV PROC. 12/16/2023 4:20 PM Corday Wyka RVT, RDMS

## 2023-12-16 NOTE — Progress Notes (Signed)
 Lower extremity dopplers were normal.  Mariel Aloe, MD

## 2023-12-16 NOTE — Anesthesia Postprocedure Evaluation (Signed)
 Anesthesia Post Note  Patient: Shelby Serrano  Procedure(s) Performed: AN AD HOC LABOR EPIDURAL     Anesthesia Type: Epidural Level of consciousness: awake and alert Pain management: pain level controlled Vital Signs Assessment: post-procedure vital signs reviewed and stable Respiratory status: spontaneous breathing Cardiovascular status: stable Postop Assessment: no headache, patient able to bend at knees, no backache, no apparent nausea or vomiting, epidural receding, adequate PO intake, spinal receding and able to ambulate Anesthetic complications: no   No notable events documented.  Last Vitals:  Vitals:   12/15/23 2300 12/16/23 0312  BP: 124/78 105/74  Pulse: 60 67  Resp: 18 18  Temp: 36.7 C   SpO2: 99% 100%    Last Pain:  Vitals:   12/16/23 0520  TempSrc:   PainSc: 5    Pain Goal:                   Salome Arnt

## 2023-12-16 NOTE — Discharge Summary (Signed)
 Postpartum Discharge Summary  Date of Service updated 12/16/2023     Patient Name: Shelby Serrano DOB: 12-03-1995 MRN: [id number]  Date of admission: 12/15/2023 Delivery date:12/15/2023 Delivering provider: Dorathy Kinsman Date of discharge: 12/16/2023  Admitting diagnosis: Normal labor [O80, Z37.9] Intrauterine pregnancy: [redacted]w[redacted]d     Secondary diagnosis:  Active Problems:   History of failed BTL with Filshie clips   Pyelonephritis affecting pregnancy in third trimester   Insufficient prenatal care, no visits between 16-[redacted] weeks gestation   NSVD (normal spontaneous vaginal delivery)  Additional problems: none    Discharge diagnosis: Term Pregnancy Delivered                                              Post partum procedures: none Augmentation: AROM Complications: None  Hospital course: Onset of Labor With Vaginal Delivery      28 y.o. yo [phone] at [redacted]w[redacted]d was admitted in Active Labor on 12/15/2023. Labor course was uncomplicated.  Membrane Rupture Time/Date: 10:33 AM,12/15/2023  Delivery Method:Vaginal, Spontaneous Operative Delivery:N/A Episiotomy: None Lacerations:  None Patient had a postpartum course complicated by none.  She is ambulating, tolerating a regular diet, passing flatus, and urinating well. Patient is discharged home in stable condition on 12/16/23.  Newborn Data: Birth date:12/15/2023 Birth time:10:52 AM Gender:Female Living status:Living Apgars:8 ,9  Weight:2720 g  Magnesium Sulfate received: No BMZ received: No Rhophylac:N/A MMR:Yes T-DaP:Given prenatally Flu: Yes RSV Vaccine received: No Transfusion:No  Immunizations received: Immunization History  Administered Date(s) Administered   Influenza, Seasonal, Injecte, Preservative Fre 07/20/2023   Influenza,inj,Quad PF,6+ Mos 05/18/2016, 05/31/2018   Influenza-Unspecified 05/18/2016, 05/31/2018   Tdap 06/01/2016, 05/31/2018, 12/20/2019, 01/12/2021, 10/28/2023    Physical exam  Vitals:    12/15/23 1742 12/15/23 1845 12/15/23 2300 12/16/23 0312  BP:  112/82 124/78 105/74  Pulse:  65 60 67  Resp:  16 18 18   Temp: (!) 97.3 F (36.3 C) 97.7 F (36.5 C) 98 F (36.7 C)   TempSrc: Axillary Axillary Axillary   SpO2:  100% 99% 100%  Weight:      Height:       General: alert, cooperative, and no distress Lochia: appropriate Uterine Fundus: firm Incision: N/A DVT Evaluation: No evidence of DVT seen on physical exam. Labs: Lab Results  Component Value Date   WBC 12.2 (H) 12/16/2023   HGB 10.9 (L) 12/16/2023   HCT 31.7 (L) 12/16/2023   MCV 81.7 12/16/2023   PLT 171 12/16/2023      Latest Ref Rng & Units 10/28/2023    7:29 PM  CMP  Glucose 70 - 99 mg/dL 76   BUN 6 - 20 mg/dL 5   Creatinine 1.47 - 8.29 mg/dL 5.62   Sodium 130 - 865 mmol/L 134   Potassium 3.5 - 5.1 mmol/L 4.0   Chloride 98 - 111 mmol/L 104   CO2 22 - 32 mmol/L 23   Calcium 8.9 - 10.3 mg/dL 9.0   Total Protein 6.5 - 8.1 g/dL 6.3   Total Bilirubin 0.0 - 1.2 mg/dL 0.7   Alkaline Phos 38 - 126 U/L 118   AST 15 - 41 U/L 26   ALT 0 - 44 U/L 26    Edinburgh Score:    12/15/2023    2:10 PM  Edinburgh Postnatal Depression Scale Screening Tool  I have been able to laugh and see the  funny side of things. 0  I have looked forward with enjoyment to things. 0  I have blamed myself unnecessarily when things went wrong. 0  I have been anxious or worried for no good reason. 0  I have felt scared or panicky for no good reason. 0  Things have been getting on top of me. 1  I have been so unhappy that I have had difficulty sleeping. 0  I have felt sad or miserable. 0  I have been so unhappy that I have been crying. 0  The thought of harming myself has occurred to me. 0  Edinburgh Postnatal Depression Scale Total 1   Edinburgh Postnatal Depression Scale Total: 1   After visit meds:  Allergies as of 12/16/2023   No Known Allergies      Medication List     STOP taking these medications    aspirin EC 81  MG tablet       TAKE these medications    acetaminophen 325 MG tablet Commonly known as: Tylenol Take 2 tablets (650 mg total) by mouth every 4 (four) hours as needed (for pain scale < 4).   Blood Pressure Kit Devi 1 each by Does not apply route as needed.   cyclobenzaprine 10 MG tablet Commonly known as: FLEXERIL Take 1 tablet (10 mg total) by mouth every 8 (eight) hours as needed for muscle spasms.   ibuprofen 600 MG tablet Commonly known as: ADVIL Take 1 tablet (600 mg total) by mouth every 6 (six) hours.   PrePLUS 27-1 MG Tabs Take 1 tablet by mouth daily.         Discharge home in stable condition Infant Feeding: Bottle Infant Disposition:home with mother Discharge instruction: per After Visit Summary and Postpartum booklet. Activity: Advance as tolerated. Pelvic rest for 6 weeks.  Diet: routine diet Future Appointments: Future Appointments  Date Time Provider Department Center  12/16/2023  3:00 PM [facility] VASC US 6 [facility]  12/30/2023  8:15 AM [facility]  01/26/2024  9:15 AM Lorriane Shire, MD [facility]   Follow up Visit:  Follow-up Information     [facility]. Schedule an appointment as soon as possible for a visit.   Specialty: Obstetrics and Gynecology Why: Routine postpartum follow up in 4-6 weeks Contact information: [address] [place] [address] [phone]               Message sent to [facility] 4/3  Please schedule this patient for a In person postpartum visit in 4 weeks with the following provider: Any provider. Additional Postpartum F/U:Postpartum Depression checkup  High risk pregnancy complicated by:  Hx of 24 wk IUFD Delivery mode:  Vaginal, Spontaneous Anticipated Birth Control:  Unsure   12/16/2023 Denton Ar, DO, MPH  Family Medicine, PGY-1

## 2023-12-17 ENCOUNTER — Other Ambulatory Visit (HOSPITAL_COMMUNITY): Payer: Self-pay

## 2023-12-17 MED ORDER — CYCLOBENZAPRINE HCL 10 MG PO TABS
10.0000 mg | ORAL_TABLET | Freq: Three times a day (TID) | ORAL | 1 refills | Status: DC | PRN
  Filled 2023-12-17: qty 30, 10d supply, fill #0

## 2023-12-17 MED ORDER — CYCLOBENZAPRINE HCL 5 MG PO TABS
5.0000 mg | ORAL_TABLET | Freq: Three times a day (TID) | ORAL | Status: DC | PRN
  Administered 2023-12-17: 5 mg via ORAL
  Filled 2023-12-17: qty 1

## 2023-12-21 ENCOUNTER — Encounter: Payer: Self-pay | Admitting: Obstetrics and Gynecology

## 2023-12-22 ENCOUNTER — Ambulatory Visit

## 2023-12-22 ENCOUNTER — Encounter (HOSPITAL_COMMUNITY): Payer: Self-pay | Admitting: Obstetrics & Gynecology

## 2023-12-24 ENCOUNTER — Telehealth (HOSPITAL_COMMUNITY): Payer: Self-pay

## 2023-12-24 NOTE — Telephone Encounter (Signed)
 12/24/2023 1525  Name: Shelby Serrano MRN: [id number] DOB: 16-May-1996  Reason for Call:  Transition of Care Hospital Discharge Call  Contact Status: Patient Contact Status: Complete  Language assistant needed:          Follow-Up Questions: Do You Have Any Concerns About Your Health As You Heal From Delivery?: No Do You Have Any Concerns About Your Infants Health?: No  Edinburgh Postnatal Depression Scale:  In the Past 7 Days:    PHQ2-9 Depression Scale:     Discharge Follow-up: Edinburgh score requires follow up?:  (Patient declines EPDS today. She states that she is doing good emotionally. RN told patient to reach out to her OB should concerns arise.) Patient was advised of the following resources:: Support Group, Breastfeeding Support Group  Post-discharge interventions: Reviewed Newborn Safe Sleep Practices  Signature  Wadell Guild

## 2023-12-28 ENCOUNTER — Encounter: Payer: Self-pay | Admitting: Family Medicine

## 2023-12-29 NOTE — BH Specialist Note (Signed)
 Integrated Behavioral Health via Telemedicine Visit  01/12/2024 [name] [id number]  Number of Integrated Behavioral Health Clinician visits: 1- Initial Visit  Session Start time: [phone]   Session End time: 0900  Total time in minutes: 10   Referring Provider: Abner Ables, MD Patient/Family location: Home [facility] Provider location: Center for [facility] at [facility]  All persons participating in visit: Patient Shelby Serrano and [facility] Shelby Serrano   Types of Service: Individual psychotherapy and Video visit  I connected with Shelby Serrano Shelby Serrano and/or Shelby Serrano's  n/a  via  Telephone or [employer]  (Video is Surveyor, mining) and verified that I am speaking with the correct person using two identifiers. Discussed confidentiality: Yes   I discussed the limitations of telemedicine and the availability of in person appointments.  Discussed there is a possibility of technology failure and discussed alternative modes of communication if that failure occurs.  I discussed that engaging in this telemedicine visit, they consent to the provision of behavioral healthcare and the services will be billed under their insurance.  Patient and/or legal guardian expressed understanding and consented to Telemedicine visit: Yes   Presenting Concerns: Patient and/or family reports the following symptoms/concerns: Pt concerned about sciatica not going away; sleeping when baby sleeps, good appetite, taking prenatal vitamin, no mood issues postpartum, has good support at home; no other questions or concerns at this time.   Patient and/or Family's Strengths/Protective Factors: Social connections, Concrete supports in place (healthy food, safe environments, etc.), and Sense of purpose  Goals Addressed: Patient will:   Increase knowledge and/or ability of: healthy habits   Demonstrate ability to: Increase healthy  adjustment to current life circumstances and Increase adequate support systems for patient/family  Progress towards Goals: Ongoing  Interventions: Interventions utilized:  Functional Assessment of ADLs, Psychoeducation and/or Health Education, Link to [employer], and Supportive Reflection Standardized Assessments completed: Not Needed  Patient and/or Family Response: Patient agrees with treatment plan.   Assessment: Patient currently experiencing At risk for depression postpartum (none noted today).   Patient may benefit from psychoeducation and brief therapeutic interventions regarding adjusting to new motherhood postpartum. .  Plan: Follow up with behavioral health clinician on : Call Shelby Serrano at [phone], as needed. Behavioral recommendations:  -Continue taking prenatal vitamin until postpartum medical visit; discuss any changes with medical provider at that time -Continue prioritizing healthy self-care (regular meals, adequate rest; allowing practical help from supportive friends and family) until at least postpartum medical appointment -Consider new mom support group as needed at either www.[email or web address].net or [email or web address]   Referral(s): Community Resources:  Childcare; New mom support  I discussed the assessment and treatment plan with the patient and/or parent/guardian. They were provided an opportunity to ask questions and all were answered. They agreed with the plan and demonstrated an understanding of the instructions.   They were advised to call back or seek an in-person evaluation if the symptoms worsen or if the condition fails to improve as anticipated.  Georgia Kipper, LCSW     06/22/2023   11:40 AM 10/10/2020    8:29 AM 02/12/2020   11:27 AM 02/04/2020    2:37 PM 01/18/2020   10:02 AM  Depression screen PHQ 2/9  Decreased Interest 0 0 0 0 0  Down, Depressed, Hopeless 0 0 0 0 0  PHQ - 2 Score 0 0 0 0 0  Altered sleeping 0 0 0 0 0  Tired,  decreased energy 0  0 0 0 0  Change in appetite 0 0 0 0 0  Feeling bad or failure about yourself  0 0 0 0 0  Trouble concentrating 0 0 0 0 0  Moving slowly or fidgety/restless 0 0 0 0 0  Suicidal thoughts 0 0 0 0 0  PHQ-9 Score 0 0 0 0 0      06/22/2023   11:41 AM 10/10/2020    8:30 AM 02/12/2020   11:27 AM 02/04/2020    2:37 PM  GAD 7 : Generalized Anxiety Score  Nervous, Anxious, on Edge 0 0 0 0  Control/stop worrying 0 0 0 0  Worry too much - different things 0 0 0 0  Trouble relaxing 0 0 0 0  Restless 0 0 0 0  Easily annoyed or irritable 0 0 0 0  Afraid - awful might happen 0 0 0 0  Total GAD 7 Score 0 0 0 0      12/15/2023    2:10 PM 01/12/2021   11:57 AM 01/12/2021    8:36 AM 03/18/2020    2:15 PM 02/18/2020    5:11 PM  Edinburgh Postnatal Depression Scale Screening Tool  I have been able to laugh and see the funny side of things. 0 0 -- 0 0  I have looked forward with enjoyment to things. 0 0  0 0  I have blamed myself unnecessarily when things went wrong. 0 0  0 1  I have been anxious or worried for no good reason. 0 0  0 0  I have felt scared or panicky for no good reason. 0 0  0 0  Things have been getting on top of me. 1 1  0 0  I have been so unhappy that I have had difficulty sleeping. 0 0  0 0  I have felt sad or miserable. 0 0  0 0  I have been so unhappy that I have been crying. 0 0  0 1  The thought of harming myself has occurred to me. 0 0  0 0  Edinburgh Postnatal Depression Scale Total 1 1  0 2

## 2024-01-12 ENCOUNTER — Ambulatory Visit: Payer: Self-pay | Admitting: Clinical

## 2024-01-12 DIAGNOSIS — Z9189 Other specified personal risk factors, not elsewhere classified: Secondary | ICD-10-CM

## 2024-01-12 NOTE — Patient Instructions (Signed)
 Center for [facility] Healthcare at [facility] for Women [address] [place], [place] [address] [phone] (main office) [phone] [facility] [place] office)  New Parent Support Groups [email or web address].net [email or web address]  [place] [employer]  (Childcare options, Early childcare development, etc.) www.[email or web address]  [employer] Child Care Facility Search Engine  [email or web address]

## 2024-01-26 ENCOUNTER — Ambulatory Visit: Admitting: Obstetrics and Gynecology

## 2024-01-26 ENCOUNTER — Other Ambulatory Visit: Payer: Self-pay

## 2024-01-26 DIAGNOSIS — G8929 Other chronic pain: Secondary | ICD-10-CM | POA: Diagnosis not present

## 2024-01-26 DIAGNOSIS — M545 Low back pain, unspecified: Secondary | ICD-10-CM | POA: Diagnosis not present

## 2024-01-26 NOTE — Progress Notes (Signed)
 Post Partum Visit Note  Shelby Serrano is a 28 y.o. [phone] female who presents for a postpartum visit. She is 6 weeks postpartum following a normal spontaneous vaginal delivery.  I have fully reviewed the prenatal and intrapartum course. The delivery was at 37.2 gestational weeks.  Anesthesia: epidural. Postpartum course has been uncomplicated. Baby is doing well. Baby is feeding by both breast and bottle - Similac Sensitive RS. Bleeding thin lochia. Bowel function is normal. Bladder function is normal. Patient is not sexually active. Contraception method is Depo-Provera  injections. Postpartum depression screening: negative.  Stopped taking muscle relaxer. Taking    The pregnancy intention screening data noted above was reviewed. Potential methods of contraception were discussed. The patient elected to proceed with No data recorded.    Health Maintenance Due  Topic Date Due   COVID-19 Vaccine (1 - 2024-25 season) Never done   Cervical Cancer Screening (Pap smear)  10/09/2023    The following portions of the patient's history were reviewed and updated as appropriate: allergies, current medications, past family history, past medical history, past social history, past surgical history, and problem list.  Review of Systems Pertinent items are noted in HPI.  Objective:  BP 129/80   Pulse 60   Wt 116 lb (52.6 kg)   LMP 03/24/2023 (Approximate)   Breastfeeding Yes   BMI 21.92 kg/m    General:  alert, cooperative, and no distress   Breasts:  not indicated  Lungs: Normal effort  Wound N/a  GU exam:  not indicated       Assessment:   1. Postpartum state (Primary) Doing well   2. Chronic bilateral low back pain, unspecified whether sciatica present Not currently taking muscle relaxer due to drowsiness Continue OTC analgesia Recommend follow up with PCP to discuss role of PT vs other interventions to address back pain   routine postpartum exam.   Plan:   Essential  components of care per ACOG recommendations:  1.  Mood and well being: Patient with negative depression screening today. Reviewed local resources for support.  - Patient tobacco use? Yes. Patient desires to quit? Yes.Discussed reduction and cessation not a current smoker - hx of drug use? No.    2. Infant care and feeding:  -Patient currently breastmilk feeding? Yes. Reviewed importance of draining breast regularly to support lactation.  -Social determinants of health (SDOH) reviewed in EPIC. No concerns  3. Sexuality, contraception and birth spacing - Patient does not want a pregnancy in the next year.  Desired family size is unsure/TBD children.  - Reviewed reproductive life planning. Reviewed contraceptive methods based on pt preferences and effectiveness.  Patient desired Hormonal Injection today.   - Discussed birth spacing of 18 months  4. Sleep and fatigue -Encouraged family/partner/community support of 4 hrs of uninterrupted sleep to help with mood and fatigue  5. Physical Recovery  - Discussed patients delivery and complications. She describes her labor as good. - Patient had a Vaginal, no problems at delivery. Patient had no laceration. Perineal healing reviewed. Patient expressed understanding - Patient has urinary incontinence? No. - Patient is safe to resume physical and sexual activity  6.  Health Maintenance - HM due items addressed Yes - Last pap smear  Diagnosis  Date Value Ref Range Status  10/08/2020   Final   - Negative for intraepithelial lesion or malignancy (NILM)   Pap smear not done at today's visit.  -Breast Cancer screening indicated? No.   7. Chronic Disease/Pregnancy Condition follow  up: chronic back pain   - PCP follow up  Lennart Quitter, RN Center for [employer], [facility]

## 2024-03-09 ENCOUNTER — Ambulatory Visit: Admitting: Obstetrics and Gynecology

## 2024-03-14 ENCOUNTER — Ambulatory Visit

## 2024-04-24 ENCOUNTER — Encounter: Payer: Self-pay | Admitting: Family Medicine

## 2024-04-24 ENCOUNTER — Other Ambulatory Visit: Payer: Self-pay

## 2024-04-24 ENCOUNTER — Ambulatory Visit: Admitting: Family Medicine

## 2024-04-24 ENCOUNTER — Other Ambulatory Visit (HOSPITAL_COMMUNITY)
Admission: RE | Admit: 2024-04-24 | Discharge: 2024-04-24 | Disposition: A | Discharge status: Home / Self Care | Source: Ambulatory Visit | Attending: Family Medicine | Admitting: Family Medicine

## 2024-04-24 VITALS — BP 115/85 | HR 63 | Ht 61.0 in | Wt 117.3 lb

## 2024-04-24 DIAGNOSIS — Z124 Encounter for screening for malignant neoplasm of cervix: Secondary | ICD-10-CM | POA: Insufficient documentation

## 2024-04-24 DIAGNOSIS — Z3009 Encounter for other general counseling and advice on contraception: Secondary | ICD-10-CM

## 2024-04-24 DIAGNOSIS — Z1151 Encounter for screening for human papillomavirus (HPV): Secondary | ICD-10-CM | POA: Diagnosis not present

## 2024-04-24 DIAGNOSIS — Z01419 Encounter for gynecological examination (general) (routine) without abnormal findings: Secondary | ICD-10-CM

## 2024-04-24 DIAGNOSIS — Z113 Encounter for screening for infections with a predominantly sexual mode of transmission: Secondary | ICD-10-CM | POA: Diagnosis not present

## 2024-04-24 MED ORDER — NORELGESTROMIN-ETH ESTRADIOL 150-35 MCG/24HR TD PTWK: 1.0000 | MEDICATED_PATCH | TRANSDERMAL | 12 refills | Status: AC

## 2024-04-24 NOTE — Progress Notes (Signed)
 ANNUAL EXAM Patient name: Shelby Serrano MRN [id number]  Date of birth: October 21, 1995 Chief Complaint:   Gynecologic Exam  History of Present Illness:   Shelby Serrano is a 28 y.o. [id number] African-American female being seen today for a routine annual exam.  Current complaints: irregular bleeding since June 2025, missed Depo injection in June. Interested in patch instead of Depo for contraception. Has used Micronor  pills, Depo in the past. Depo has caused irregular bleeding during prior uses as well. She would like something that she does not need to take every day. Requesting STI screening in addition to pap smear.  No LMP recorded. (Menstrual status: Irregular Periods). Currently bleeding.   The pregnancy intention screening data noted above was reviewed. Potential methods of contraception were discussed. The patient elected to proceed with No data recorded.   Last pap January 2022. Results were: NILM. H/O abnormal pap: no Last mammogram: N/A.  Family h/o breast cancer: no Last colonoscopy: N/A. Family h/o colorectal cancer: no     04/24/2024   11:18 AM 06/22/2023   11:40 AM 10/10/2020    8:29 AM 02/12/2020   11:27 AM 02/04/2020    2:37 PM  Depression screen PHQ 2/9  Decreased Interest 0 0 0 0 0  Down, Depressed, Hopeless 0 0 0 0 0  PHQ - 2 Score 0 0 0 0 0  Altered sleeping 0 0 0 0 0  Tired, decreased energy 0 0 0 0 0  Change in appetite 0 0 0 0 0  Feeling bad or failure about yourself  0 0 0 0 0  Trouble concentrating 0 0 0 0 0  Moving slowly or fidgety/restless 0 0 0 0 0  Suicidal thoughts 0 0 0 0 0  PHQ-9 Score 0 0 0 0 0        04/24/2024   11:18 AM 06/22/2023   11:41 AM 10/10/2020    8:30 AM 02/12/2020   11:27 AM  GAD 7 : Generalized Anxiety Score  Nervous, Anxious, on Edge 0 0 0 0  Control/stop worrying 0 0 0 0  Worry too much - different things 0 0 0 0  Trouble relaxing 0 0 0 0  Restless 0 0 0 0  Easily annoyed or irritable 0 0 0 0  Afraid - awful might  happen 0 0 0 0  Total GAD 7 Score 0 0 0 0     Review of Systems:   Pertinent items are noted in HPI Denies any headaches, blurred vision, fatigue, shortness of breath, chest pain, abdominal pain, abnormal vaginal discharge/itching/odor/irritation, problems with periods, bowel movements, urination, or intercourse unless otherwise stated above.  Pertinent History Reviewed:  Reviewed past medical,surgical, social and family history.  Reviewed problem list, medications and allergies.  Physical Assessment:   Vitals:   04/24/24 1102  BP: 115/85  Pulse: 63  Weight: 117 lb 4.8 oz (53.2 kg)  Height: 5' 1 (1.549 m)   Body mass index is 22.16 kg/m.   Physical Examination:  General appearance - well appearing, and in no distress Mental status - alert, oriented to person, place, and time Psych:  She has a normal mood and affect Skin - warm and dry, normal color, no suspicious lesions noted Chest - effort normal, no problems with respiration noted Heart - normal rate and regular rhythm Neck:  midline trachea, no thyromegaly or nodules Breasts - breasts appear normal, no suspicious masses, no skin or nipple changes or  axillary nodes Abdomen - soft,  nontender, nondistended, no masses or organomegaly Pelvic - VULVA: normal appearing vulva with no masses, tenderness or lesions   VAGINA: normal appearing vagina with normal color and discharge, no lesions. Vaginal bleeding present.  CERVIX: normal appearing cervix without discharge or lesions, no CMT.  BIMANUAL EXAM: no uterine masses or adnexal masses. Thin prep pap is done Extremities:  No swelling or varicosities noted  Chaperone present for exam: Sharlet Mulch CMA  No results found for this or any previous visit (from the past 24 hours).  Assessment & Plan:  1. Encounter for well woman exam (Primary) - Will follow up results of pap smear and manage accordingly. - Routine preventative health maintenance measures emphasized. - Please  refer to After Visit Summary for other counseling recommendations.   2. Cervical cancer screening Repeat in 3 years if normal.  3. Screening for STD (sexually transmitted disease)  4. Encounter for counseling regarding contraception - Reviewed different types of birth control available: OCPs, vaginal ring, Nexplanon, Depo, various types of IUDs, permanent sterilization.  We reviewed the advantages and risks of each (particularly risk of VTE with estrogen containing options). We discussed side effects of each. - Reviewed that birth control does not protect against STI. Condoms reduce the risk of transmission but are not 100% effective especially for HSV and HIV. - Patient has tried: PP Depo given, POPs, Depo, and BTL - Patient desires: Patch - Patient desires follow up in 3 months to ensure patch is a good fit and that irregular bleeding is improving.       Mammogram: @ 28yo, or sooner if problems Colonoscopy: @ 28yo, or sooner if problems  Orders Placed This Encounter  Procedures   RPR   HIV Antibody (routine testing w rflx)   Hepatitis B Surface AntiGEN   Hepatitis C Antibody    Meds:  Meds ordered this encounter  Medications   norelgestromin -ethinyl estradiol  (XULANE) 150-35 MCG/24HR transdermal patch    Sig: Place 1 patch onto the skin once a week.    Dispense:  3 patch    Refill:  12    Follow-up: Return in about 3 months (around 07/25/2024) for follow up for starting patch for birth control.  Joesph DELENA Sear, PA

## 2024-04-25 LAB — HIV ANTIBODY (ROUTINE TESTING W REFLEX): HIV Screen 4th Generation wRfx: NONREACTIVE

## 2024-04-25 LAB — HEPATITIS C ANTIBODY: Hep C Virus Ab: NONREACTIVE

## 2024-04-25 LAB — HEPATITIS B SURFACE ANTIGEN: Hepatitis B Surface Ag: NEGATIVE

## 2024-04-25 LAB — RPR: RPR Ser Ql: NONREACTIVE

## 2024-04-26 ENCOUNTER — Ambulatory Visit: Payer: Self-pay | Admitting: Family Medicine

## 2024-04-26 LAB — CYTOLOGY - PAP
Adequacy: ABSENT
Chlamydia: NEGATIVE
Comment: NEGATIVE
Comment: NEGATIVE
Comment: NORMAL
Diagnosis: NEGATIVE
Neisseria Gonorrhea: NEGATIVE
Trichomonas: NEGATIVE

## 2024-09-17 ENCOUNTER — Other Ambulatory Visit: Payer: Self-pay

## 2024-09-17 ENCOUNTER — Emergency Department (HOSPITAL_COMMUNITY)
Admission: EM | Admit: 2024-09-17 | Discharge: 2024-09-17 | Disposition: A | Discharge status: Home / Self Care | Attending: Emergency Medicine | Admitting: Emergency Medicine

## 2024-09-17 DIAGNOSIS — N2884 Pyelitis cystica: Secondary | ICD-10-CM | POA: Insufficient documentation

## 2024-09-17 DIAGNOSIS — N12 Tubulo-interstitial nephritis, not specified as acute or chronic: Secondary | ICD-10-CM

## 2024-09-17 DIAGNOSIS — D72829 Elevated white blood cell count, unspecified: Secondary | ICD-10-CM | POA: Diagnosis not present

## 2024-09-17 DIAGNOSIS — R109 Unspecified abdominal pain: Secondary | ICD-10-CM | POA: Diagnosis present

## 2024-09-17 LAB — COMPREHENSIVE METABOLIC PANEL WITH GFR
ALT: 13 U/L (ref 0–44)
AST: 17 U/L (ref 15–41)
Albumin: 4.2 g/dL (ref 3.5–5.0)
Alkaline Phosphatase: 102 U/L (ref 38–126)
Anion gap: 11 (ref 5–15)
BUN: 6 mg/dL (ref 6–20)
CO2: 24 mmol/L (ref 22–32)
Calcium: 9.7 mg/dL (ref 8.9–10.3)
Chloride: 102 mmol/L (ref 98–111)
Creatinine, Ser: 0.89 mg/dL (ref 0.44–1.00)
GFR, Estimated: >60 mL/min
Glucose, Bld: 99 mg/dL (ref 70–99)
Potassium: 4 mmol/L (ref 3.5–5.1)
Sodium: 137 mmol/L (ref 135–145)
Total Bilirubin: 0.9 mg/dL (ref 0.0–1.2)
Total Protein: 7.7 g/dL (ref 6.5–8.1)

## 2024-09-17 LAB — URINALYSIS, ROUTINE W REFLEX MICROSCOPIC
Bilirubin Urine: NEGATIVE
Glucose, UA: NEGATIVE mg/dL
Ketones, ur: NEGATIVE mg/dL
Nitrite: POSITIVE — AB
Protein, ur: NEGATIVE mg/dL
Specific Gravity, Urine: 1.009 (ref 1.005–1.030)
WBC, UA: >50 WBC/hpf (ref 0–5)
pH: 6 (ref 5.0–8.0)

## 2024-09-17 LAB — CBC
HCT: 39.7 % (ref 36.0–46.0)
Hemoglobin: 14 g/dL (ref 12.0–15.0)
MCH: 29.8 pg (ref 26.0–34.0)
MCHC: 35.3 g/dL (ref 30.0–36.0)
MCV: 84.5 fL (ref 80.0–100.0)
Platelets: 207 K/uL (ref 150–400)
RBC: 4.7 MIL/uL (ref 3.87–5.11)
RDW: 14.5 % (ref 11.5–15.5)
WBC: 11.6 K/uL — ABNORMAL HIGH (ref 4.0–10.5)
nRBC: 0 % (ref 0.0–0.2)

## 2024-09-17 LAB — HCG, SERUM, QUALITATIVE: Preg, Serum: NEGATIVE

## 2024-09-17 LAB — LIPASE, BLOOD: Lipase: 13 U/L (ref 11–51)

## 2024-09-17 MED ORDER — KETOROLAC TROMETHAMINE 15 MG/ML IJ SOLN
15.0000 mg | Freq: Once | INTRAMUSCULAR | Status: AC
  Administered 2024-09-17: 15 mg via INTRAMUSCULAR
  Filled 2024-09-17: qty 1

## 2024-09-17 MED ORDER — CIPROFLOXACIN HCL 500 MG PO TABS: 500.0000 mg | ORAL_TABLET | Freq: Two times a day (BID) | ORAL | 0 refills | Status: AC

## 2024-09-17 MED ORDER — CIPROFLOXACIN HCL 500 MG PO TABS
500.0000 mg | ORAL_TABLET | Freq: Once | ORAL | Status: AC
  Administered 2024-09-17: 500 mg via ORAL
  Filled 2024-09-17: qty 1

## 2024-09-17 MED ORDER — KETOROLAC TROMETHAMINE 15 MG/ML IJ SOLN: 15.0000 mg | Freq: Once | INTRAMUSCULAR | Status: DC

## 2024-09-17 NOTE — Discharge Instructions (Addendum)
 It was a pleasure meeting with you today. Your urine tested positive for infection today. You likely have a kidney infection based on your symptoms. We have given you one dose of antibiotics in the ED today and sent antibiotic prescription to your pharmacy. Follow up with your primary care provider for ongoing evaluation of improvement of symptoms. Please return for further evaluation if pain persists or worsens, you develop fevers, or any other new or concerning symptoms develop.  Please use Tylenol  or ibuprofen  for pain.  You may use 600 mg ibuprofen  every 6 hours or 1000 mg of Tylenol  every 6 hours.  You may choose to alternate between the 2.  This would be most effective.  Not to exceed 4 g of Tylenol  within 24 hours.  Not to exceed 3200 mg ibuprofen  24 hours.

## 2024-09-17 NOTE — ED Provider Notes (Signed)
 " [facility] AT [facility] Provider Note   CSN: [id number] Arrival date & time: 09/17/24  9175     Patient presents with: Abdominal Pain   Shelby Serrano is a 29 y.o. female.   Patient is here for abdominal pain for the last 2 weeks that has been getting progressively worse over time.  The pain began in the right side of the abdomen and traveled to the left over time, now involving the entire abdomen.  She also complains of lower back pain.  Denies dysuria, frank hematuria, increased urinary frequency.  She actually goes on to say that she has been urinated less.  Denies known fever.  Patient has a 56-month-old baby and is no longer breast-feeding.  She reports history of kidney infection while she was pregnant.  She states current symptoms do not feel similar.  She cannot recall which antibiotic she was given at that time.  The history is provided by the patient.  Abdominal Pain Pain location:  Generalized      Prior to Admission medications  Medication Sig Start Date End Date Taking? Authorizing Provider  ciprofloxacin  (CIPRO ) 500 MG tablet Take 1 tablet (500 mg total) by mouth every 12 (twelve) hours for 7 days. 09/17/24 09/24/24 Yes Rosina Almarie LABOR, PA-C  acetaminophen  (TYLENOL ) 325 MG tablet Take 2 tablets (650 mg total) by mouth every 4 (four) hours as needed (for pain scale < 4). 12/16/23   Arlyss Roselie PARAS, MD  norelgestromin -ethinyl estradiol  (XULANE) 150-35 MCG/24HR transdermal patch Place 1 patch onto the skin once a week. 04/24/24   Wallace Joesph LABOR, PA    Allergies: Patient has no known allergies.    Review of Systems  Gastrointestinal:  Positive for abdominal pain.   Vitals:   09/17/24 0828 09/17/24 0834  BP: (!) 120/90   Pulse: 89   Resp: 18   Temp: 97.6 F (36.4 C)   SpO2: 100%   Height:  5' 1 (1.549 m)     Updated Vital Signs BP (!) 120/90   Pulse 89   Temp 97.6 F (36.4 C)   Resp 18   Ht 5' 1 (1.549 m)    LMP 08/17/2024 (Approximate)   SpO2 100%   Breastfeeding No   BMI 22.16 kg/m   Physical Exam Vitals and nursing note reviewed.  Constitutional:      General: She is not in acute distress.    Appearance: Normal appearance. She is well-developed and normal weight. She is not ill-appearing, toxic-appearing or diaphoretic.  HENT:     Head: Normocephalic and atraumatic.     Mouth/Throat:     Mouth: Mucous membranes are moist.  Eyes:     General: No scleral icterus.    Extraocular Movements: Extraocular movements intact.     Conjunctiva/sclera: Conjunctivae normal.  Cardiovascular:     Rate and Rhythm: Normal rate and regular rhythm.     Pulses: Normal pulses.     Heart sounds: Normal heart sounds.  Pulmonary:     Effort: Pulmonary effort is normal. No respiratory distress.     Breath sounds: Normal breath sounds. No stridor. No wheezing, rhonchi or rales.  Abdominal:     General: Abdomen is flat. Bowel sounds are normal. There is no distension.     Palpations: Abdomen is soft.     Tenderness: There is generalized abdominal tenderness. There is right CVA tenderness and left CVA tenderness. There is no guarding.     Comments: Exquisitely tender on  exam of abdomen and CVA  Skin:    General: Skin is warm and dry.     Capillary Refill: Capillary refill takes less than 2 seconds.     Coloration: Skin is not jaundiced or pale.  Neurological:     Mental Status: She is alert and oriented to person, place, and time.     (all labs ordered are listed, but only abnormal results are displayed) Labs Reviewed  CBC - Abnormal; Notable for the following components:      Result Value   WBC 11.6 (*)    All other components within normal limits  URINALYSIS, ROUTINE W REFLEX MICROSCOPIC - Abnormal; Notable for the following components:   APPearance CLOUDY (*)    Hgb urine dipstick MODERATE (*)    Nitrite POSITIVE (*)    Leukocytes,Ua LARGE (*)    Bacteria, UA MANY (*)    All other components  within normal limits  URINE CULTURE  LIPASE, BLOOD  COMPREHENSIVE METABOLIC PANEL WITH GFR  HCG, SERUM, QUALITATIVE    EKG: None  Radiology: No results found.   Procedures   Medications Ordered in the ED  ketorolac  (TORADOL ) 15 MG/ML injection 15 mg (15 mg Intramuscular Given 09/17/24 1009)  ciprofloxacin  (CIPRO ) tablet 500 mg (500 mg Oral Given 09/17/24 1013)     Patient presents to the ED for concern of abdominal pain and bilateral flank pain, this involves an extensive number of treatment options, and is a complaint that carries with it a high risk of complications and morbidity.  The differential diagnosis includes acute pancreatitis, gastroenteritis, biliary disease, UTI, pyelonephritis, appendicitis, diverticulosis.    Co morbidities that complicate the patient evaluation  Pyelonephritis during pregnancy   Additional history obtained:  Additional history obtained from Outside Medical Records and Past Admission   External records from outside source obtained and reviewed including February 2025 admission for pyelonephritis during pregnancy, February 2025 urine culture sensitivities.   Lab Tests:  I Ordered, and personally interpreted labs.  The pertinent results include:  Urinalysis indicative of infection. WBC slightly elevated 11.6. Normal kidney function labs. Normal lipase. U-preg negative.   Medicines ordered and prescription drug management:  I ordered medication including ciprofloxacin  and toradol   for pyelonephritis and pain.  Reevaluation of the patient after these medicines showed that the patient improved I have reviewed the patients home medicines and have made adjustments as needed   Problem List / ED Course:    Lipase is normal lowering suspicion of acute pancreatitis. History and physical exam lower suspicion for gastroenteritis, biliary disease, appendicitis, and diverticulosis. Urinalysis indicative of infection. Physical exam consistent with  pyelonephritis.  Pyelonephritis. Vitals and labs are reassuring for non-systemic infection. Urinalysis indicative of infection. Physical exam correlates with pyelonephritis. Normal kidney function labs. Given antibiotic and pain medication. Antibiotics sent to pharmacy. Return precautions discussed and patient verbalized understanding. Stable for discharge.   Reevaluation:  After the interventions noted above, I reevaluated the patient and found that they have :improved   Dispostion:  After consideration of the diagnostic results and the patients response to treatment, I feel that the patent would benefit from supportive care in the home setting with antibiotic prescription and OTC pain medication as well as close follow up with primary care.  Return precautions given.                                  Medical Decision Making Amount and/or  Complexity of Data Reviewed Labs: ordered.   This note was produced using [employer]. While the provider has reviewed and verified all clinical information, transcription errors may remain.    Final diagnoses:  Pyelocystitis    ED Discharge Orders          Ordered    ciprofloxacin  (CIPRO ) 500 MG tablet  Every 12 hours        09/17/24 1015               Rosina Almarie LABOR, PA-C 09/17/24 1029    Pamella Sharper A, DO 09/17/24 1608  "

## 2024-09-17 NOTE — ED Triage Notes (Signed)
 Patient  c/o lower abd. Pain onset 1 week ago ,denies vaginal bleeding

## 2024-09-17 NOTE — ED Triage Notes (Signed)
 Patient states initially pain was on the L side and now is additionally on the R. Endorses nausea without vomiting.

## 2024-09-19 LAB — URINE CULTURE: Culture: >=100000 — AB

## 2024-09-20 ENCOUNTER — Telehealth (HOSPITAL_BASED_OUTPATIENT_CLINIC_OR_DEPARTMENT_OTHER): Payer: Self-pay | Admitting: *Deleted

## 2024-09-20 NOTE — Telephone Encounter (Signed)
 Post ED Visit - Positive Culture Follow-up: Unsuccessful Patient Follow-up  Culture assessed and recommendations reviewed by:  [x]  Aileen Jinmenez Pharm.D. []  Venetia Gully, Pharm.D., BCPS AQ-ID []  Garrel Crews, Pharm.D., BCPS []  Almarie Lunger, Pharm.D., BCPS []  [place], [address].D., BCPS, AAHIVP []  Rosaline Bihari, Pharm.D., BCPS, AAHIVP []  Massie Rigg, PharmD []  Jodie Rower, PharmD, BCPS  Positive urine culture Reviewed by Jon Salt Plan: Stop Cipro   []  Patient discharged without antimicrobial prescription and treatment is now indicated [x]  Organism is resistant to prescribed ED discharge antimicrobial []  Patient with positive blood cultures   Unable to contact patient after 3 attempts, letter will be sent to address on file  Shelby Serrano 09/20/2024, 10:23 AM
